# Patient Record
Sex: Male | Born: 1945 | Race: Black or African American | Hispanic: No | Marital: Married | Smoking: Former smoker
Health system: Southern US, Community
[De-identification: ages and names within clinical notes are randomized; demographics above are authoritative.]

## PROBLEM LIST (undated history)

## (undated) DIAGNOSIS — I82409 Acute embolism and thrombosis of unspecified deep veins of unspecified lower extremity: Secondary | ICD-10-CM

## (undated) DIAGNOSIS — I10 Essential (primary) hypertension: Secondary | ICD-10-CM

## (undated) DIAGNOSIS — Z950 Presence of cardiac pacemaker: Secondary | ICD-10-CM

## (undated) DIAGNOSIS — E039 Hypothyroidism, unspecified: Secondary | ICD-10-CM

## (undated) DIAGNOSIS — I639 Cerebral infarction, unspecified: Secondary | ICD-10-CM

## (undated) DIAGNOSIS — Z86718 Personal history of other venous thrombosis and embolism: Secondary | ICD-10-CM

## (undated) DIAGNOSIS — F4310 Post-traumatic stress disorder, unspecified: Secondary | ICD-10-CM

## (undated) DIAGNOSIS — K754 Autoimmune hepatitis: Secondary | ICD-10-CM

## (undated) DIAGNOSIS — A0472 Enterocolitis due to Clostridium difficile, not specified as recurrent: Secondary | ICD-10-CM

## (undated) DIAGNOSIS — K7460 Unspecified cirrhosis of liver: Secondary | ICD-10-CM

## (undated) DIAGNOSIS — E119 Type 2 diabetes mellitus without complications: Secondary | ICD-10-CM

## (undated) HISTORY — PX: TONSILLECTOMY: SUR1361

## (undated) HISTORY — DX: Enterocolitis due to Clostridium difficile, not specified as recurrent: A04.72

## (undated) HISTORY — PX: INSERT / REPLACE / REMOVE PACEMAKER: SUR710

## (undated) HISTORY — DX: Post-traumatic stress disorder, unspecified: F43.10

## (undated) HISTORY — DX: Autoimmune hepatitis: K75.4

## (undated) HISTORY — DX: Unspecified cirrhosis of liver: K74.60

---

## 1999-03-09 ENCOUNTER — Emergency Department (HOSPITAL_COMMUNITY): Admission: EM | Admit: 1999-03-09 | Discharge: 1999-03-09 | Payer: Self-pay | Admitting: Emergency Medicine

## 1999-03-13 ENCOUNTER — Encounter: Payer: Self-pay | Admitting: Emergency Medicine

## 1999-03-13 ENCOUNTER — Emergency Department (HOSPITAL_COMMUNITY): Admission: EM | Admit: 1999-03-13 | Discharge: 1999-03-13 | Payer: Self-pay | Admitting: Emergency Medicine

## 1999-11-06 ENCOUNTER — Encounter: Payer: Self-pay | Admitting: Emergency Medicine

## 1999-11-06 ENCOUNTER — Emergency Department (HOSPITAL_COMMUNITY): Admission: EM | Admit: 1999-11-06 | Discharge: 1999-11-06 | Payer: Self-pay | Admitting: Emergency Medicine

## 2002-04-28 ENCOUNTER — Encounter: Admission: RE | Admit: 2002-04-28 | Discharge: 2002-04-28 | Payer: Self-pay | Admitting: Urology

## 2002-04-28 ENCOUNTER — Encounter: Payer: Self-pay | Admitting: Urology

## 2002-10-26 ENCOUNTER — Emergency Department (HOSPITAL_COMMUNITY): Admission: EM | Admit: 2002-10-26 | Discharge: 2002-10-26 | Payer: Self-pay | Admitting: Emergency Medicine

## 2002-10-26 ENCOUNTER — Encounter: Payer: Self-pay | Admitting: Emergency Medicine

## 2003-07-25 ENCOUNTER — Ambulatory Visit (HOSPITAL_BASED_OUTPATIENT_CLINIC_OR_DEPARTMENT_OTHER): Admission: RE | Admit: 2003-07-25 | Discharge: 2003-07-25 | Payer: Self-pay | Admitting: Urology

## 2003-07-25 ENCOUNTER — Ambulatory Visit (HOSPITAL_COMMUNITY): Admission: RE | Admit: 2003-07-25 | Discharge: 2003-07-25 | Payer: Self-pay | Admitting: Urology

## 2007-02-18 ENCOUNTER — Inpatient Hospital Stay (HOSPITAL_COMMUNITY): Admission: EM | Admit: 2007-02-18 | Discharge: 2007-03-04 | Payer: Self-pay | Admitting: Emergency Medicine

## 2007-02-19 ENCOUNTER — Encounter: Payer: Self-pay | Admitting: Cardiology

## 2007-02-19 ENCOUNTER — Ambulatory Visit: Payer: Self-pay | Admitting: Cardiology

## 2007-02-20 ENCOUNTER — Encounter: Payer: Self-pay | Admitting: Internal Medicine

## 2007-02-23 ENCOUNTER — Ambulatory Visit: Payer: Self-pay | Admitting: Physical Medicine & Rehabilitation

## 2007-02-24 ENCOUNTER — Encounter: Payer: Self-pay | Admitting: Internal Medicine

## 2007-06-05 ENCOUNTER — Ambulatory Visit: Payer: Self-pay | Admitting: Family Medicine

## 2007-06-05 ENCOUNTER — Inpatient Hospital Stay (HOSPITAL_COMMUNITY): Admission: EM | Admit: 2007-06-05 | Discharge: 2007-06-11 | Payer: Self-pay | Admitting: Emergency Medicine

## 2007-06-08 ENCOUNTER — Ambulatory Visit: Payer: Self-pay | Admitting: Physical Medicine & Rehabilitation

## 2007-06-11 ENCOUNTER — Inpatient Hospital Stay (HOSPITAL_COMMUNITY)
Admission: RE | Admit: 2007-06-11 | Discharge: 2007-06-26 | Payer: Self-pay | Admitting: Physical Medicine & Rehabilitation

## 2007-07-29 ENCOUNTER — Encounter
Admission: RE | Admit: 2007-07-29 | Discharge: 2007-07-31 | Payer: Self-pay | Admitting: Physical Medicine & Rehabilitation

## 2007-07-29 ENCOUNTER — Ambulatory Visit: Payer: Self-pay | Admitting: Physical Medicine & Rehabilitation

## 2007-09-12 ENCOUNTER — Ambulatory Visit: Payer: Self-pay | Admitting: Infectious Disease

## 2007-09-12 ENCOUNTER — Inpatient Hospital Stay (HOSPITAL_COMMUNITY): Admission: EM | Admit: 2007-09-12 | Discharge: 2007-09-18 | Payer: Self-pay | Admitting: Emergency Medicine

## 2007-09-25 ENCOUNTER — Emergency Department (HOSPITAL_COMMUNITY): Admission: EM | Admit: 2007-09-25 | Discharge: 2007-09-25 | Payer: Self-pay | Admitting: Emergency Medicine

## 2007-12-02 ENCOUNTER — Emergency Department (HOSPITAL_COMMUNITY): Admission: EM | Admit: 2007-12-02 | Discharge: 2007-12-02 | Payer: Self-pay | Admitting: Emergency Medicine

## 2008-02-05 ENCOUNTER — Inpatient Hospital Stay (HOSPITAL_COMMUNITY): Admission: EM | Admit: 2008-02-05 | Discharge: 2008-02-10 | Payer: Self-pay | Admitting: Emergency Medicine

## 2008-02-05 ENCOUNTER — Ambulatory Visit: Payer: Self-pay | Admitting: Cardiology

## 2008-02-06 ENCOUNTER — Ambulatory Visit: Payer: Self-pay | Admitting: Infectious Diseases

## 2008-02-08 ENCOUNTER — Ambulatory Visit: Payer: Self-pay | Admitting: Vascular Surgery

## 2008-02-08 ENCOUNTER — Encounter (INDEPENDENT_AMBULATORY_CARE_PROVIDER_SITE_OTHER): Payer: Self-pay | Admitting: Internal Medicine

## 2008-02-08 ENCOUNTER — Encounter: Payer: Self-pay | Admitting: Infectious Diseases

## 2008-02-12 ENCOUNTER — Encounter (INDEPENDENT_AMBULATORY_CARE_PROVIDER_SITE_OTHER): Payer: Self-pay | Admitting: *Deleted

## 2008-02-12 ENCOUNTER — Telehealth (INDEPENDENT_AMBULATORY_CARE_PROVIDER_SITE_OTHER): Payer: Self-pay | Admitting: *Deleted

## 2008-02-16 ENCOUNTER — Encounter (INDEPENDENT_AMBULATORY_CARE_PROVIDER_SITE_OTHER): Payer: Self-pay | Admitting: *Deleted

## 2008-02-19 ENCOUNTER — Telehealth (INDEPENDENT_AMBULATORY_CARE_PROVIDER_SITE_OTHER): Payer: Self-pay | Admitting: *Deleted

## 2008-02-23 ENCOUNTER — Emergency Department (HOSPITAL_COMMUNITY): Admission: EM | Admit: 2008-02-23 | Discharge: 2008-02-23 | Payer: Self-pay | Admitting: Emergency Medicine

## 2008-02-23 ENCOUNTER — Telehealth (INDEPENDENT_AMBULATORY_CARE_PROVIDER_SITE_OTHER): Payer: Self-pay | Admitting: *Deleted

## 2008-03-07 ENCOUNTER — Encounter (INDEPENDENT_AMBULATORY_CARE_PROVIDER_SITE_OTHER): Payer: Self-pay | Admitting: *Deleted

## 2008-03-09 ENCOUNTER — Encounter (INDEPENDENT_AMBULATORY_CARE_PROVIDER_SITE_OTHER): Payer: Self-pay | Admitting: *Deleted

## 2008-08-28 ENCOUNTER — Inpatient Hospital Stay (HOSPITAL_COMMUNITY): Admission: EM | Admit: 2008-08-28 | Discharge: 2008-08-31 | Payer: Self-pay | Admitting: Emergency Medicine

## 2008-08-30 ENCOUNTER — Ambulatory Visit: Payer: Self-pay | Admitting: Surgery

## 2008-08-30 ENCOUNTER — Encounter (INDEPENDENT_AMBULATORY_CARE_PROVIDER_SITE_OTHER): Payer: Self-pay | Admitting: Internal Medicine

## 2008-10-17 ENCOUNTER — Emergency Department (HOSPITAL_COMMUNITY): Admission: EM | Admit: 2008-10-17 | Discharge: 2008-10-17 | Payer: Self-pay | Admitting: Emergency Medicine

## 2009-12-05 ENCOUNTER — Emergency Department (HOSPITAL_COMMUNITY): Admission: EM | Admit: 2009-12-05 | Discharge: 2009-12-05 | Payer: Self-pay | Admitting: Emergency Medicine

## 2009-12-24 ENCOUNTER — Inpatient Hospital Stay (HOSPITAL_COMMUNITY): Admission: EM | Admit: 2009-12-24 | Discharge: 2009-12-26 | Payer: Self-pay | Admitting: Emergency Medicine

## 2010-11-18 ENCOUNTER — Encounter: Payer: Self-pay | Admitting: Interventional Radiology

## 2010-11-18 ENCOUNTER — Encounter: Payer: Self-pay | Admitting: Internal Medicine

## 2011-01-16 LAB — GLUCOSE, CAPILLARY
Glucose-Capillary: 137 mg/dL — ABNORMAL HIGH (ref 70–99)
Glucose-Capillary: 178 mg/dL — ABNORMAL HIGH (ref 70–99)
Glucose-Capillary: 191 mg/dL — ABNORMAL HIGH (ref 70–99)
Glucose-Capillary: 210 mg/dL — ABNORMAL HIGH (ref 70–99)
Glucose-Capillary: 225 mg/dL — ABNORMAL HIGH (ref 70–99)
Glucose-Capillary: 72 mg/dL (ref 70–99)

## 2011-01-16 LAB — CBC
MCV: 85.5 fL (ref 78.0–100.0)
RBC: 4.11 MIL/uL — ABNORMAL LOW (ref 4.22–5.81)
WBC: 6.3 10*3/uL (ref 4.0–10.5)

## 2011-01-16 LAB — DIFFERENTIAL
Lymphs Abs: 2.9 10*3/uL (ref 0.7–4.0)
Monocytes Relative: 14 % — ABNORMAL HIGH (ref 3–12)
Neutro Abs: 2.1 10*3/uL (ref 1.7–7.7)
Neutrophils Relative %: 33 % — ABNORMAL LOW (ref 43–77)

## 2011-01-16 LAB — POCT CARDIAC MARKERS
CKMB, poc: 1.3 ng/mL (ref 1.0–8.0)
Myoglobin, poc: 111 ng/mL (ref 12–200)

## 2011-01-16 LAB — APTT: aPTT: 30 seconds (ref 24–37)

## 2011-01-16 LAB — BASIC METABOLIC PANEL
Calcium: 8.8 mg/dL (ref 8.4–10.5)
Chloride: 98 mEq/L (ref 96–112)
Creatinine, Ser: 1.42 mg/dL (ref 0.4–1.5)
GFR calc Af Amer: >60 mL/min (ref >60)

## 2011-01-16 LAB — VITAMIN B12: Vitamin B-12: 638 pg/mL (ref 211–911)

## 2011-01-16 LAB — URINALYSIS, ROUTINE W REFLEX MICROSCOPIC
Bilirubin Urine: NEGATIVE
Hgb urine dipstick: NEGATIVE
Protein, ur: 30 mg/dL — AB
Urobilinogen, UA: 0.2 mg/dL (ref 0.0–1.0)

## 2011-01-16 LAB — FOLATE: Folate: 12.1 ng/mL

## 2011-01-16 LAB — CARDIAC PANEL(CRET KIN+CKTOT+MB+TROPI)
CK, MB: 2.6 ng/mL (ref 0.3–4.0)
Total CK: 90 U/L (ref 7–232)
Troponin I: 0.07 ng/mL — ABNORMAL HIGH (ref 0.00–0.06)

## 2011-01-16 LAB — LIPID PANEL
Cholesterol: 134 mg/dL (ref 0–200)
LDL Cholesterol: 46 mg/dL (ref 0–99)
VLDL: 59 mg/dL — ABNORMAL HIGH (ref 0–40)

## 2011-01-16 LAB — RETICULOCYTES
Retic Count, Absolute: 56.7 10*3/uL (ref 19.0–186.0)
Retic Ct Pct: 1.3 % (ref 0.4–3.1)

## 2011-01-16 LAB — PROTIME-INR: INR: 1.03 (ref 0.00–1.49)

## 2011-01-16 LAB — TSH: TSH: 1.989 u[IU]/mL (ref 0.350–4.500)

## 2011-01-16 LAB — CK TOTAL AND CKMB (NOT AT ARMC)
Relative Index: INVALID (ref 0.0–2.5)
Total CK: 94 U/L (ref 7–232)

## 2011-01-17 LAB — URINALYSIS, ROUTINE W REFLEX MICROSCOPIC
Bilirubin Urine: NEGATIVE
Ketones, ur: NEGATIVE mg/dL
Nitrite: POSITIVE — AB
Protein, ur: >300 mg/dL — AB
Urobilinogen, UA: 1 mg/dL (ref 0.0–1.0)
pH: 7 (ref 5.0–8.0)

## 2011-01-17 LAB — URINE CULTURE: Colony Count: >=100000

## 2011-01-17 LAB — DIFFERENTIAL
Basophils Relative: 0 % (ref 0–1)
Eosinophils Absolute: 0 10*3/uL (ref 0.0–0.7)
Lymphs Abs: 1.8 10*3/uL (ref 0.7–4.0)
Neutrophils Relative %: 81 % — ABNORMAL HIGH (ref 43–77)

## 2011-01-17 LAB — POCT I-STAT, CHEM 8
HCT: 42 % (ref 39.0–52.0)
Hemoglobin: 14.3 g/dL (ref 13.0–17.0)
Potassium: 3.9 mEq/L (ref 3.5–5.1)
Sodium: 133 mEq/L — ABNORMAL LOW (ref 135–145)

## 2011-01-17 LAB — CBC
MCHC: 33.3 g/dL (ref 30.0–36.0)
MCV: 85.6 fL (ref 78.0–100.0)
Platelets: 153 10*3/uL (ref 150–400)
WBC: 18.4 10*3/uL — ABNORMAL HIGH (ref 4.0–10.5)

## 2011-01-17 LAB — URINE MICROSCOPIC-ADD ON

## 2011-01-21 LAB — GLUCOSE, CAPILLARY: Glucose-Capillary: 192 mg/dL — ABNORMAL HIGH (ref 70–99)

## 2011-03-12 NOTE — H&P (Signed)
NAMEBernita Li NO.:  [id number]   MEDICAL RECORD NO.:  [id number]          PATIENT TYPE:  EMS   LOCATION:  MAJO                         FACILITY:  [facility]   PHYSICIAN:  Eduard Clos, MDDATE OF BIRTH:  August 24, 1946   DATE OF ADMISSION:  08/28/2008  DATE OF DISCHARGE:                              HISTORY & PHYSICAL   PRIMARY CARE PHYSICIAN:  Dr. Sherril Croon.   CHIEF COMPLAINT:  Right lower extremity swelling, erythema and  discharge.   HISTORY OF PRESENTING ILLNESS:  [age]-year-old male with a history  of DVT on heparin subcutaneous, diabetes mellitus type 2, noncompliant  with medications, hypertension, hyperlipidemia, previous history of  stroke and facial trauma during service.  Presented to the emergency  room complaining of increasing swelling of his right lower extremity.  He states that a pimple-like swelling started on his right greater toe,  which slowly starting increasing in size, swelling and erythema and now  has a small wound there.  It is not clearly defined, has some discharge  and some blood in the discharge.  Denies any fever or chills.  Patient  had an x-ray, which shows possibility of 5th metatarsal fracture.  Patient has been admitted for further management of his cellulitis.  Patient denies any chest pain, shortness of breath, weakness of limbs,  loss of consciousness, nausea, vomiting, diarrhea, or dysuria or any  discharges.  The patient is incontinent of urine and wears a diaper.   PAST MEDICAL HISTORY:  1. Diabetes mellitus type 2.  Has been off medications for a week now.  2. Hypertension.  3. History of previous CVA.  4. Facial injury during service in [employer].  5. Hyperlipidemia.   PAST SURGICAL HISTORY:  1. Umbilical hernia surgery.  2. Left inguinal hernia surgery.  3. Facial surgery.  4. Has had a cardiac cath 2 or 3 years ago and patient states that it      was fine.   MEDICATON PRIOR TO ADMISSION:  1. Patient  remembers some of the medicines and said he was recently      changed to Lantus insulin 60 units every 12 with regular insulin 10      units before each meal 3 times daily.  2. He is also on heparin 250 units subcutaneous every 12 for his DVT      and does not know why he is not on Coumadin.  3. He takes a medication for blood pressure, which he states is      nifedipine, dose not know.  4. He takes Lipitor 20 mg p.o. daily.   ALLERGIES:  No known drug allergies.   FAMILY HISTORY:  Nothing contributory.   SOCIAL HISTORY:  He lives at home.  Not married.  Gets support form his  sister.  Has two kids.  Denies smoking cigarettes, drinking alcohol or  using illegal drugs.   REVIEW OF SYSTEMS:  As in history of presenting illness.  Nothing else  significant.   PHYSICAL EXAMINATION:  Patient examined at bedside.  Not in acute  distress.  VITAL  SIGNS:  Blood pressure 120/68, pulse 78, temperature 98.1,  respirations 18, O2 saturation 98%.  HEENT:  Anicteric.  No pallor.  CHEST:  B bilateral air entry present.  No rhonchi on auscultation.  HEART:  S1, S2 heard.  ABDOMEN:  Soft, nontender.  There is umbilical hernia, which is not  obstructed.  Bowel sounds heard.  CNS:  Alert, awake, or tined to time, place and person.  Moves all four  extremities.  EXTREMITIES:  There is swelling of the right foot extending up to his  ankle.  There is also ill-defined ulcer like area on his right greater  toe base with some bloody discharge.  There is no focal tenderness.  He  is able to move his toes without difficulty and his ankle also without  difficulty.  There is no tenderness in his 5th metatarsal bone area.  Peripheral pulses felt.  No edema appreciated in the left foot.   LABORATORIES:  X-ray of his right foot shows soft tissue swelling.  No  plain film evidence of osteomyelitis.  Lucency in the base of the 5th  metatarsal may represent overlapping shadows, but a nondisplaced  incomplete  fracture cannot be excluded.  Chest x-ray:  Cardiomegaly.  No  evidence of acute pulmonary abnormality.  CBC:  WBC is 6.3, hemoglobin  11.2, hematocrit 34.3.  Platelets 196.  Neutrophils 48%.  PTT/INR 13.1  and 1.  Complete metabolic panel:  Sodium 129, potassium 4.4, chloride  98, carbon dioxide 25, glucose 57, BUN 32, anion gap is 7.  AST 20, ALT  21.  Albumin 3.6, calcium 9.5.  Acetone is negative.  Troponin-I less  than 0.05.  BNP less than 30.  Urine shows blood negative.  Urine  glucose more than 1000.  Ketones negative.  WBC 0.  Nitrites negative.  Leukocytes negative.   ASSESSMENT:  1. Cellulitis of the right foot.  2. Possible fracture of the 5th metatarsal.  3. Diabetes mellitus type 2, uncontrolled.  4. Dehydration.  5. Hypertension.  6. History of deep vein thrombosis on heparin.  7. Hyperlipidemia.   PLAN:  Admit patient to medical floor.  We will start patient on  intravenous vancomycin and Zosyn.  Get blood cultures, wound cultures.  We will place patient on Lantus at his regular dose along with sliding  scale coverage.  Get an MRI of his foot.  Probably may need an  orthopedic consult based pm the MRI.      Eduard Clos, MD  Electronically Signed     ANK/MEDQ  D:  08/28/2008  T:  08/28/2008  Job:  [id number]

## 2011-03-12 NOTE — Discharge Summary (Signed)
NAMEOMER, PUCCINELLI NO.:  [id number]   MEDICAL RECORD NO.:  [id number]          PATIENT TYPE:  IPS   LOCATION:  4035                         FACILITY:  [facility]   PHYSICIAN:  Ellwood Dense, M.D.   DATE OF BIRTH:  [birth year]   DATE OF ADMISSION:  06/11/2007  DATE OF DISCHARGE:  06/26/2007                               DISCHARGE SUMMARY   DISCHARGE DIAGNOSES:  1. Right subinsular infarction.  2. Hypothyroidism.  3. Insulin-dependent diabetes mellitus.  4. Post-traumatic stress syndrome.  5. Hyperlipidemia.  6. Gastroesophageal flex disease.  7. E-coli urinary tract infection, resolved.  8. Hypertension.   This 65 year old male admitted August 8 with altered mental status,  slurred speech.  MRI showed a small right subinsular acute infarction.  MRA with atherosclerotic changes, left internal carotid are stenosis  70%.  Recent echocardiogram with ejection fraction of 60% and normal  left ventricular function.  E. coli urinary tract infection treated with  Bactrim maintained on aspirin for stroke.  Blood sugars elevated, Lantus  insulin adjusted for history of insulin-dependent diabetes mellitus.   PAST MEDICAL HISTORY:  See discharge diagnoses.  No alcohol.  Remote  smoker.   ALLERGIES:  None.   SOCIAL HISTORY:  He lives with his brother. Chart notes that a son and  daughter-in-law would like to take him home if at all possible at  discharge.   MEDICATIONS PRIOR TO ADMISSION:  1. Regular insulin 70 units.  2. Aspirin 81 mg daily.  3. Zocor daily.  4. Ezetimibe 10 mg daily.  5. Neurontin 300 mg daily.  6. Synthroid 125 mcg daily.  7. Lorazepam 0.5 mg daily.  8. Potassium 20 mEq daily.  9. Zoloft 100 mg daily.  10.Trazodone 200 mg at bedtime.  11.Insulin-N 30 units 3 times daily.  12.Lisinopril 40 mg daily.   It was noted the patient with a poor medical compliance to his  medications.   REHABILITATION AND HOSPITAL COURSE:  The patient was admitted  to  inpatient rehab services with therapies initiated on a 3-hour daily  basis consisting of physical therapy, occupational therapy, speech  therapy and rehabilitation and nursing. The following issues were  addressed during the patient's rehabilitation stay.  Pertaining to Mr.  [name] right subinsular infarction remained stable.  Maintained on  aspirin therapy.  Functionally he showed slow progressive gains, minimal  assist for transfers, minimal assist for stairs, ambulating 150 feet  minimal assistance, minimal assistance for activities of daily living.  He was on a diabetic diet for his diabetes mellitus, latest blood sugars  of 90 and 76.  He continued on Lantus insulin. Again it was stressed the  need to maintain current medical regimen as he had been noncompliant  with medications in the past.  Blood pressures were monitored and  controlled on lisinopril 20 mg daily, diastolic pressures 60-83.  He had  completed a course of antibiotics for a urinary tract infection.  He  would remain on Zocor and Zetia for hyperlipidemia.  Overall his  strength and endurance had greatly improved.  He was encouraged with his  overall progress and discharged to home.   Latest labs showed a hemoglobin 11.5, hematocrit 34, platelet 256,000.  Sodium 134, potassium 5.0, BUN 20, creatinine 1.55.  A follow-up cranial  CT scan was done on August 20 that showed no new changes.  He was  discharged to home.   DISCHARGE MEDICATIONS:  1. Zocor 10 mg at bedtime.  2. Neurontin 300 mg daily.  3. Protonix 40 mg daily.  4. Zetia 10 mg daily.  5. Zoloft 100 mg daily.  6. Trazodone 200 mg at bedtime.  7. Synthroid 125 mcg daily.  8. Aspirin 81 mg daily.  9. Ativan 0.5 mg every a.m.  10.Lantus insulin 50 units every 12 hours.  11.Lisinopril 20 mg daily.  12.Insulin NovoLog 8 units 3 times daily.  13.Amoxicillin 250 mg 3 times daily until June 29, 2007 for      Enterococcus urinary tract infection.    DIET:  Diabetic diet.   SPECIAL INSTRUCTIONS:  The patient would follow up with Dr. Ellwood Dense at the outpatient rehab service office. His primary care  providers were the [facility] [place]  received medical management for.      Mariam Dollar, P.A.    ______________________________  Ellwood Dense, M.D.    DA/MEDQ  D:  06/25/2007  T:  06/26/2007  Job:  [id number]   cc:   Lindaann Slough, M.D.  [facility]

## 2011-03-12 NOTE — Assessment & Plan Note (Signed)
[name] returns to the clinic today for followup evaluation.  He is a  65 year old African-American male who was admitted June 05, 2007 with  altered mental status and slurred speech.  MRI study of the brain shows  small right subinsular acute infarction.  MRA study shows  atherosclerotic changes, along with stenosis of 70% of the left internal  carotid artery.  Recent echocardiogram showed an ejection fraction of  60% with normal left ventricular function.  He was treated for E. coli  urinary tract infection.  He subsequently was moved to the  rehabilitation unit June 11, 2007 and remained there through discharge  June 26, 2007.   Since discharge, the patient continues to receive home health physical  and occupational therapy.  He has been back to see his primary care  physician at [facility].  He reports that no changes in his  medicines were made.  He reports that his blood sugar has been in the 60  to 220 range.  He reports that he is continent of bowel and bladder.  He  continues to live with his brother and tries to eat low fat diet.  He  reports only mild pain of his bilateral hands and feet, which is a  chronic problem for him.   REVIEW OF SYSTEMS:  Noncontributory.   MEDICATIONS:  1. Zocor 10 mg nightly.  2. Neurontin 300 mg daily.  3. Protonix 40 mg daily.  4. Zetia 10 mg daily.  5. Zoloft 100 mg daily.  6. Trazodone 200 mg nightly.  7. Synthroid 125 mcg daily.  8. Aspirin 81 mg daily.  9. Ativan 0.5 mg q. a.m.  10.Lantus insulin 50 units q.12h.  11.Lisinopril 20 mg daily.  12.NovoLog insulin 8 units t.i.d. with meals.   PHYSICAL EXAM:  Reasonably well-appearing elderly adult male seated in a  regular chair.  He ambulates with a rolling walker.  His blood pressure is 134/67, pulse 62, respiratory rate is 18, and O2  saturation is 98% on room air.  He has 4-/5 strength in the bilateral upper extremities and 3+/5 to 4-/5  strength in the bilateral lower  extremities.  Bulk and tone are normal.  Sensation was intact to light touch throughout the bilateral upper and  lower extremities.   IMPRESSION:  1. Status post small right subinsular infarction with minimal left-      sided weakness.  2. Insulin dependent diabetes mellitus.  3. Post-traumatic stress disorder.  4. Hypothyroidism.  5. Hypertension.  6. Gastroesophageal reflux disease.  7. Dyslipidemia.   In the office today, no refill on medications is necessary.  We will  have him complete his home health therapy and then have him follow up  with his primary care physicians at [facility].  We will plan to  see him in followup on an as needed basis.  He is doing very well from  his recent stroke.  Continues on aspirin therapy.  He  needs slightly better management of his diabetes, but that will be  supplied through the [facility].  We will plan on seeing him in  followup on an as needed basis.           ______________________________  Ellwood Dense, M.D.     DC/MedQ  D:  07/31/2007 12:21:10  T:  07/31/2007 18:44:35  Job #:  [id number]

## 2011-03-12 NOTE — Discharge Summary (Signed)
NAMEHARLIE, [name]                 ACCOUNT NO.:  [id number]   MEDICAL RECORD NO.:  [id number]          PATIENT TYPE:  INP   LOCATION:  4714                         FACILITY:  [facility]   PHYSICIAN:  Mariea Stable, MD   DATE OF BIRTH:  [birth year]   DATE OF ADMISSION:  09/12/2007  DATE OF DISCHARGE:  09/18/2007                               DISCHARGE SUMMARY   DISCHARGE DIAGNOSES:  1. Syncopal episode.  2. Altered mental status.  3. Diabetes mellitus.  4. Hypertension  5. Hyperlipidemia.  6. Hypothyroidism.  7. History of cerebrovascular disease status post cerebrovascular      accident.  8. Pulmonary artery disease.  9. Post traumatic stress disorder.  10.Chronic sinusitis.  11.Bradycardia.   DISCHARGE MEDICATIONS:  1. Aspirin 81 mg p.o. daily.  2. Synthroid 125 mcg p.o. daily.  3. Zocor 10 mg p.o. daily.  4. Zetia 10 mg p.o. daily.  5. Lisinopril 20 mg p.o. daily.  6. Zoloft 100 mg p.o. daily.  7. Claritin 10 mEq p.o. daily.  8. Ativan 0.5 mg p.o. daily p.r.n.anxiety.  9. Lantus 60 units subcutaneously nightly.  10.Aggrenox 25/200 mg one tablet p.o. b.i.d.  11.HCTZ 25 mg p.o. daily.   FOLLOWUP:  The patient was instructed to followup with [facility] as soon  as possible.  Preferably within the next 1 to 2 weeks.  The patient also  had an advanced set up to go draw the insulin syringes for the patient's  sister to administer.  During followup at the [place], the patient's insulin  could be further adjusted along the antihypertensive.  Medications for  hyperlipidemia, hypothyroidism discussed.  The patient apparently has a  very poor compliance and he is not competent to keep track of his own  medications.  Given that the patient is competent; however, to decide on  SNF placement, the patient was discharged home with home health set up  and his sister to help administrate the insulin.   PROCEDURES:  1. Chest x-ray September 12, 2007, Impression:  mild CE and no acute  pulmonary process.  2. Head CT without contrast 09/12/07, Impression:  1. negative for      acute intracranial hemorrhage or edema.  2. sinus inflammatory      changes.   CONSULTATIONS:  Psychiatry was consulted and Dr. Jeanie Sewer saw the  patient.   BRIEF ADMITTING HISTORY AND PHYSICAL:  [name] is a 65 year old African  American male with past medical history of a subinsular CVA with minimal  residual left-sided weakness, diabetes mellitus type 2 treated with  insulin, hypertension, hyperlipidemia, and hypothyroidism who now  presents after being found by family to have altered mental status.  The  patient was found on the floor and EMS was called.  CBG was found to be  in the 30s per EMS.  The patient was given 1 amp of D5 and subsequently  in the emergency department found the glucose in the 30s  that was read  at 63.  The patient was started on D5 half normal saline in route with  CBG of 90s at that  time.  Per the family and patient is currently at his  baseline.  We will increase fluids.   PAST MEDICAL HISTORY:  1. The patient is status post a small right subinsular CVA with      minimal residual deficits  2. Diabetes mellitus type 2 with a hemoglobin A1c of 16 in April 2008.  3. Post traumatic stress disorder.  4. Hypertension.  5. __________  6. History of acute renal failure that resolved.  7. History of hypothyroidism with a TSH of 30 in April 2008  8. History of GERD.  9. History of dyslipidemia.  10.History of enterococcal UTI.  11.The patient is status post hernia repair.  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  13.History of circumcision 2004 for chronic balanitis.   LABORATORY DATA:  On admission, sodium 139, potassium 4, chloride 100,  bicarb 35, BUN 27, creatinine 0.5, glucose 60.  Hemoglobin 15,  hematocrit 45.  CK-MB 2, troponin less than 0.05, myoglobin greater than  500.   PHYSICAL EXAMINATION:  VITAL SIGNS:  Temperature 97, blood pressure  132/79,  pulse 40, respirations 18, oxygen saturation 95% on 3 liters.  GENERAL:  The patient was somnolent, though easily arousable, though  falling right back to sleep quickly.  EYES:  Small, but not equally reactive to light.  Extraocular movements  are grossly intact.  The patient with muddy brown sclerae, but  anicteric.  ENT:  Pink, moist mucous membranes.  Oropharynx was clear.  NECK:  Supple.  Trachea midline.  No lymphadenopathy.  No bruits.  RESPIRATIONS:  Clear to auscultation bilaterally.  CARDIOVASCULAR:  Decreased S1, positive S2.  Systolic ejection murmur.  A grade 2/6 mesentery at left sternal border.  ABDOMEN:  Bowel sounds present, distended but nontender.  No rebound or  guarding.  EXTREMITIES:  Positive edema  SKIN:  No rashes or lesions.  No lymphadenopathy.  PSYCHIATRIC:  The patient was clinically depressed before this  admission.  NEURO:  Cranial nerves II-XII are grossly intact.  Strength was 5/5  bilateral upper extremities, 5/5 right lower extremity, and 4/5 left  lower extremity.  Sensation was decreased in lower extremity and was in  greater in the left .   HOSPITAL COURSE:  1. Syncope.  Given the patient's history, a CBG found to be 30s most      likely secondary to excess insulin with hypoglycemia.  The      patient's niece reported that she usually draws and checks the      needles for the patient after asking which ones will be given and      how much.  Niece states that the patient stated he took 55 units of      regular insulin.  The patient did agree to having said that.      Therefore, patient was given 55 units of regular insulin before      along with 15 units of Lantus the night prior to admission.      Therefore, admission hypoglycemic agents held in the beginning.      The patient was started on sliding scale, converted to Lantus with      daily increases.  The patient was discharged eventually with 60      units nightly and glucose level slightly  elevated.  Of note, the      patient was initially admitted and kept on the D5W half normal      saline to ensure that patient did not become hypoglycemic.  This      was then changed to saline IV fluids.  2. Hypertension.  Initially medications were held at the time.      Lisinopril was started toward the end of the hospitalization.  A      couple of medications were returned that the patient was supposed      to be on given that HCTZ was one of them that was added prior to      discharge.  3. Hyperlipidemia.  The patient was continued on his home Zetia and      Zocor.  Though this needs to be addressed at the [facility]  and may      need to be increased.  4. Hypothyroidism.  As stated in the past medical history, the      patient's TSH was approximately 32 and going down to 8 in August,      but present admission TSH was increased by 22.  This again shows      noncompliance.  The patient was restarted at home dose and to      followup by the [place].  5. History of CAD, cerebrovascular disease status post cerebrovascular      accident.  The patient was noted to have a left internal carotid      artery stenosis at approximately 7%, which is about a 70% lesion.      The patient was recommended to have stent placement per      [facility] some time ago.  The patient and sister      report the [place] is aware, but did not want to undergo any repair at      the time.  The patient again needs to followup.  The patient will      continue on aspirin throughout this hospitalization and Aggrenox      was then started empirically.  It was then found when sister      brought some of the medication the patient was taking, that he was      supposed to be on Aggrenox as well.  6. Post traumatic stress disorder.  The patient was taking Zoloft and      Ativan p.r.n. agitation.  The patient's trazodone was held      secondary to sedation.  The patient denied any suicidal ideation      and depression  throughout the hospitalization.  7. Chronic sinusitis.  The patient was continued on his Claritin      throughout the hospital stay.  8. Bradycardia.  The patient was stable with heart rates of 40s to      50s.  The patient alternating between first degree AV block and      second degree Mobitz type 1 Wenckebach.  It is questionable if this      is all related to his significant hypothyroidism.  Again, neither      of the two wanted any measures.  This patient was asymptomatic      throughout but this needs to be addressed on outpatient followup at      [facility].  9. Medical adherence and social support issues.  The patient was      offered SNF placement, but refused.  Psychiatry was consulted.  Dr.      Jeanie Sewer thinks the patient is competent to make the decision,      although incompetent to manage his medications.  It was stated that  as long as the patient has home health or the sister could      administer the insulin along with the other medications, the      patient could be sent home.  [facility] was arranged for      the patient and sister was instructed on how to administer the      patient's medications on discharge.   PLAN:  Place and followup at the [place] was to be made as soon as possible.   DISCHARGE LABORATORY DATA:  WBC 7.1, hemoglobin 12.4, platelets 176.  Sodium 135, potassium 4.0, chloride 95, bicarb 30, glucose 115, BUN 18,  creatinine 1.36, calcium 9.5.      Mariea Stable, MD  Electronically Signed     MA/MEDQ  D:  09/21/2007  T:  09/22/2007  Job:  [id number]   cc:   Antonietta Breach, M.D.

## 2011-03-12 NOTE — H&P (Signed)
[name], [name] NO.:  [id number]   MEDICAL RECORD NO.:  [id number]          PATIENT TYPE:  IPS   LOCATION:  4035                         FACILITY:  [facility]   PHYSICIAN:  Ellwood Dense, M.D.   DATE OF BIRTH:  1946/07/17   DATE OF ADMISSION:  06/11/2007  DATE OF DISCHARGE:                              HISTORY & PHYSICAL   PRIMARY CARE PHYSICIAN:  [facility]   CARDIOLOGIST:  [facility].   GU:  Dr. Brunilda Payor.   HISTORY OF PRESENT ILLNESS:  [name] is a 65 year old African-American  male with a history of insulin-dependent diabetes mellitus along with  hypertension and posttraumatic stress disorder.   The patient was admitted June 05, 2007, with altered mental status and  slurred speech.  An MRI study of the brain showed small right subinsular  acute infarct.  An MRA study showed atherosclerotic changes with  stenosis of the left internal carotid artery at 70%.   The patient had a recent echocardiogram which showed an ejection  fraction of 60% with normal left ventricular function.  E. coli urinary  tract infection was identified and he was placed on Bactrim through  June 19, 2007, then told to stop.  He was maintained on aspirin for  stroke prophylaxis.  Blood sugars have been elevated at 218, 262, 275  and Lantus insulin was recently adjusted.  The TSH was elevated at 8.114  and the patient was noted to be noncompliant with thyroid medications.  Since that time the thyroid supplement has been restarted.  Followup TSH  is planned for 1 month's time.   There was question regarding changing his aspirin to Aggrenox for stroke  prophylaxis.  The eventual decision was made to stay with aspirin  secondary to recent positive occult blood in his stool.   The patient was evaluated by the rehabilitation physicians and felt to  be an appropriate candidate for inpatient rehabilitation.   REVIEW OF SYSTEMS:  Positive for lumbago,  depression and cough.   PAST MEDICAL HISTORY:  1. Insulin-dependent diabetes mellitus.  2. Hypertension.  3. Questionable TIA vs. stroke in the past.  4. Gastroesophageal reflux disease.  5. Hypothyroidism, non-compliant on supplementation daily.  6. Dyslipidemia.  7. Posttraumatic stress disorder.  8. Prior hernia repair.  9. 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   FAMILY HISTORY:  Positive for cancer.   SOCIAL HISTORY:  The patient lives with his brother who has some health  problems of his own.  They both did some minimal cooking but ate out  frequently.  He does have a remote history of tobacco usage and denies  alcohol intake.  There reportedly are a son and daughter-in-law who  would like to take him home at discharge.   FUNCTIONAL HISTORY PRIOR TO ADMISSION:  Independent using a cane and  rolling walker prior to admission.   ALLERGIES:  No known drug allergies.   MEDICATIONS PRIOR TO ADMISSION:  1. Regular insulin 70 units daily.  2. NPH insulin 30 units t.i.d.  3. Lisinopril 40 mg daily.  4. Trazodone 200 mg q.h.s.  5. Zoloft 100 mg daily.  6. Potassium chloride 120 mEq daily.  7. Lorazepam 0.5 mg daily.  8. Synthroid 125 mcg p.o. daily.  9. Neurontin 300 mg p.o. daily.  10.Zocor daily.  11.Ezetimibe 10 mg daily.  12.Aspirin 81 mg daily.   LABORATORY:  Recent hemoglobin was 11.8 with a hematocrit of 35,  platelet count of 220,000 and white count of 9.5.  Recent CBGs have been  218, 262 and 275.  Recent sodium was 131, potassium 4.3, chloride 91,  CO2 34 and BUN 18 with creatinine of 1.3.   HEENT:  Normocephalic, nontraumatic, well appearing large adult male  lying in bed in no acute discomfort.  Blood pressure 124/70 with a pulse  of 67, respiratory rate 18, and temperature 98.5.  CARDIOVASCULAR:  Irregular rate and rhythm, S1 S2 without murmurs.  ABDOMEN:  Soft, nontender with positive bowel sounds.  LUNGS:  Clear to auscultation bilaterally.   NEUROLOGIC:  Alert and oriented times 2 to 3 with occasional Qs.  Bilateral upper extremity exam showed 4 to 4+/5 strength throughout.  Bulk and tone were normal and reflexes were 2+ and symmetrical.  LOWER EXTREMITY EXAM:  Showed hip flexion, knee extension and ankle  dorsiflexion at 4+/5 on the right and 4-/5 on the left.  Sensation was  intact to light touch throughout the bilateral lower extremities.  The  patient did have some delay in following 1 and 2 step commands with  occasional cues.   IMPRESSION:  1. Status post right subinsular infarct with left-sided weakness.  2. Insulin-dependent diabetes mellitus.  3. Poor medical compliance with medication prior to admission.   Presently, the patient has deficits in ADLs, transfers and ambulation  related to the above-noted right subinsular infarct.   PLAN:  1. Admit to the rehabilitation unit for daily therapies to include      physical therapy for range of motion, strengthening, bed mobility,      transfers, pre-gait training, gait training and equipment eval.  2. Occupational therapy for range of motion, strengthening, ADLs,      cognitive/perceptual training, splinting and equipment eval.  3. Rehab nursing for skin care, wound care and bowel and bladder      training as necessary.  4. Case Management to assess home environment, assist with discharge      planning and arrange for appropriate followup care.  5. Social worker to assess family and social support, assist in      discharge planning.  6. Speech Therapy for oral motor exercises and higher level cognitive      eval.  7. Continue high carbohydrate-modified diet.  8. CBGs a.c. and h.s.  9. NovoLog sliding scale insulin, sensitive.  10.Zetia 10 mg p.o. daily.  11.Neurontin 300 mg p.o. daily.  12.Synthroid 125 mcg p.o. daily.  13.Zoloft 100 mg p.o. daily.  14.Desyrel 200 mg p.o. q.h.s.  15.Protonix 40 mg p.o. daily.  16.Zocor 10 mg p.o. q.h.s.  17.Aspirin 81 mg p.o.  daily.  18.MiraLax 17 g with 8 ounces of water daily.  19.Ativan 0.5 mg p.o. daily.  20.NovoLog insulin 6 units subcu t.i.d. with meals.  21.Bactrim Double Strength, 1 tablet p.o. b.i.d. through June 19, 2007, then stop, for urinary tract infection.  22.Lantus insulin 50 units subcu q.12h.  23.DC IV fluids, if not already done.  24.Old EKG to chart.  25.Dulcolax suppository 1 per rectum daily p.r.n.  26.Routine turning to prevent skin  breakdown.   PROGNOSIS:  Fair/good.   ESTIMATED LENGTH OF STAY:  7-15 days.   GOALS:  Modified independent to standby assist ADLs, transfers and  ambulation.           ______________________________  Ellwood Dense, M.D.     DC/MEDQ  D:  06/11/2007  T:  06/11/2007  Job:  [id number]

## 2011-03-12 NOTE — Discharge Summary (Signed)
[name], DUDDY NO.:  [id number]   MEDICAL RECORD NO.:  [id number]          PATIENT TYPE:  INP   LOCATION:  3733                         FACILITY:  [facility]   PHYSICIAN:  Fransisco Hertz, M.D.  DATE OF BIRTH:  1946-07-04   DATE OF ADMISSION:  02/05/2008  DATE OF DISCHARGE:  02/10/2008                               DISCHARGE SUMMARY   DATE OF ADMISSION:  February 05, 2008.   DATE OF DISCHARGE:  February 10, 2008.   PRIMARY CARE PHYSICIAN:  [facility].   DISCHARGE DIAGNOSES:  1. Syncopal episode, likely multifactorial related to orthostatic      hypotension in the setting of excess Cialis use and hypoglycemia.  2. Newly diagnosed deep vein thrombosis, nonocclusive, with tiny      associated left upper lobe pulmonary emboli.  3. History of syncopal episode in 2001 secondary to hypoglycemia.  4. History of cerebrovascular accident, with right subinsular infarct      with minimal residual deficits.  5. Hypertension.  6. Type 2 diabetes mellitus, poorly controlled, hemoglobin A1c 14%.  7. Hyperlipidemia.  8. Hypothyroidism, poorly controlled.  9. History of coronary artery disease, status post 2 stents per      medical record, ejection fraction 40%.  10.History of bradycardia.  11.Newly diagnosed second-degree Mobitz type I (Wenckebach)      atrioventricular block.  12.History of left internal carotid artery stenosis of approximately      70%, stent recommended but the patient refused in November 2008.  13.History of posttraumatic stress disorder.  14.History of enterococcal urinary tract infection.  15.History of gastroesophageal reflux disease.  16.History of hernia repair.  17.History of the sphenoid sinusitis.  18.History of acute kidney injury in the setting of dehydration, April      2008.  19.Chronic tremor.   DISCHARGE MEDICATIONS:  1. Unfractionated heparin 20,000 units injected subcutaneously every      12 hours for 14  days.  2. Synthroid 75 mcg by mouth daily.  3. Lisinopril 40 mg by mouth daily.  4. Lantus 50 units injected subcutaneously every night at bedtime.  5. Lipitor 40 mg by mouth daily.  6. Aggrenox 1 capsule by mouth twice daily.  7. Hydrochlorothiazide 25 mg by mouth daily.  8. Claritin 10 mg by mouth daily.  9. Sertraline at home dose.  10.Cialis:  Take 1 tablet only when preparing for sexual activity.  Do      not take daily.   NOTE:  The patient was instructed to stop taking aspirin, as this  medication is already contained in Aggrenox.  He is also to avoid all AV  nodal blockers secondary to his diagnosis of Wenckebach AV block.   CONSULTATIONS:  Cardiology.   PROCEDURES:  1. Plain film of the chest on February 05, 2008, revealed low-volume film      with cardiomegaly and interstitial pulmonary edema.  2. Computed tomography scan of the head without contrast on February 05, 2008, revealed no acute intracranial abnormality.  Stable age      advanced  atrophy was present with mild subcortical white matter      disease.  3. Computed tomography angiogram of the chest on February 06, 2008,      revealed a moderately limited exam with tiny embolic burden to the      left upper lobe.  A dilated fluid-filled esophagus was present,      consistent with dysmotility.  Cardiomegaly, left ventricular      hypertrophy, and pericardial with small pleural effusions were      present.  Coronary artery atherosclerosis was noted.  4. Repeat plain film of the chest on February 06, 2008, demonstrated      diminished aeration but some improvement in the degree of pulmonary      vascular congestion.  5. Myocardial perfusion imaging study on February 08, 2008, revealed      decreased count in the anterior wall, mild in nature, possibly      related to attenuation.  Echocardiogram to look for anterior wall      motion abnormality was recommended.  6. Transthoracic echocardiogram performed on February 08, 2008,  revealed      overall normal left ventricular systolic function with an ejection      fraction of 60%.  There were no left ventricular regional wall      motion abnormalities.  Left ventricular wall thickness was      moderately to markedly increased.  There was a trivial pericardial      effusion anterior to the heart.   ADMISSION HISTORY:  [name] is a 65 year old African-American man with  an extensive past medical history significant for hypertension, type 2  diabetes mellitus, hypothyroidism, history of stroke, and history of a  syncopal episode caused by hypoglycemia in November 2008 who presented  to the [facility] Emergency Department on February 05, 2008, after  reportedly losing consciousness the morning of admission.  The patient  reports that he woke up at approximately 9:00 a.m., took Cialis, and  then had his caretaker give him insulin.  The patient was then noted to  be sitting on the couch, when he subsequently slumped over to the left  side and was apparently unconscious.  The patient reports that during  this time he could hear his family, but felt weak and dizzy.  The family  reports that the patient was verbally unresponsive.  EMS was called with  the blood pressure found to be 90/50, increased to 98/50 after 400 mL  bolus of IV fluid.  The patient reported that his glucose, checked by  his caregiver, was 60 before EMS arrival.  On recheck after EMS arrival,  CBG was 90.  In the emergency department, the patient was awake and  oriented.  He did report some change in his speech, saying that it was a  little off, but that this resolved.  He denied any associated  headache, nausea, vomiting, diarrhea, fevers, or chills.   ADMISSION PHYSICAL EXAM:  VITAL SIGNS:  Temperature 97.5 degrees  Fahrenheit, blood pressure 102/63, subsequently increased to 144/88,  pulse 68, respiration rate 14, and oxygen saturation 96% on 2 L nasal  cannula.  Orthostatic vital signs were checked  and were:  Supine blood  pressure 102/58 with a heart rate of 74, sitting blood pressure 148/86  with a heart rate of 74, and standing blood pressure 109/67 with a heart  rate of 78.  GENERAL:  No acute distress.  HEENT:  Pupils equal bilaterally, but sluggishly reactive to light.  Ruddy sclerae.  Moderate myosis.  Oropharynx grossly clear and moist.  Normocephalic and atraumatic.  NECK:  No lymphadenopathy, but moderate thyromegaly.  No JVD.  RESPIRATORY:  Clear to auscultation bilaterally except for mild crackles  in the left lower lung.  CARDIOVASCULAR:  Regular rate and rhythm without murmurs, rubs, or  gallops.  GASTROINTESTINAL:  Soft, tender to palpation at the site of a moderate  ventral hernia.  No rebound.  Positive bowel sounds.  EXTREMITIES:  No edema.  GENITOURINARY:  No CVA tenderness.  NEUROLOGIC:  Cranial nerves II through XII intact except for subjective  sensation difference on the right face versus the left face.  Hypoacusis  on the left was noted.  MUSCULOSKELETAL:  Exam revealed strength 4/5 in the left arm, 5/5 in all  other extremities.  Normal plantar reflexes.  Subjective decrease in  sensation in the bilateral feet.  Deep tendon reflexes difficult to  elicit.  Cerebellar function intact on the right, but mild dysmetria was  present on the left.   ADMISSION LABS:  Laboratory studies on the day of admission revealed:  Sodium 132, potassium 3.2, chloride 97, bicarbonate 28, BUN 15,  creatinine 1.41, glucose 251, and calcium 8.5.  Bilirubin 0.4, alkaline  phosphatase 97, AST 27, ALT 23, total protein 5.4, and albumin 2.8.  White blood cell count 6.2, hemoglobin 11.3, hematocrit 33.9, platelet  count 165,000, with an ANC of 44%.  Note that creatinine was 1.72 in  February 2009 and 1.36 in November 2008.  D-dimer was elevated at 3.71  with a Wells criteria score of 3, suggesting intermediate probability of  pulmonary embolus.  A transthoracic echo was done and is  described  above.  Urinalysis was negative except for 250 glucose and 100 protein.  Point of care cardiac enzymes revealed troponin 0.06, creatinine kinase  82, and CK-MB 2.5.  Coagulation studies were within normal limits.  Computed tomography scan of the head is as described above.   HOSPITAL COURSE:  1. Syncope.  The patient presented with a primary complaint of a      syncopal episode lasting approximately 30-45 minutes.  On detailed      questioning, the patient reports that he was not unconscious, but      instead severely weak and dizzy.  Differential diagnosis was      thought to include hypoglycemia, as this event occurred immediately      after an insulin injection, hypotension, especially after taking      daily Cialis for the 5 days prior to admission, and possibly      hypovolemia secondary to some subjective poor p.o. intake.  Other      diagnostic considerations include acute coronary syndrome versus      acute MI, pulmonary embolus, and stroke.  Studies in support of      hypotension causing this episode of syncope include marked      orthostasis on admission, with blood pressures as described above.      This resolved with holding Cialis and giving IV fluids.  Acute      myocardial infarction was ruled out with serial cardiac enzymes.      Although the patient's troponin was somewhat elevated at 0.07, upon      recheck this was not felt to be related to an acute myocardial      ischemic event.  EKG revealed bradycardia with second-degree Mobitz      type 1 (Wenckebach) AV block.  No acute ST-segment  depressions or      elevations were present to suggest new myocardial ischemia or      infarct.  Transthoracic echocardiogram was repeated, without signs      of wall motion abnormalities.  Myoview was performed, without clear      evidence of reversible ischemia.  Cardiology was consulted and thus      felt that the patient's syncope was not to do a cardiac related       issue.  Although he does have evidence of tiny pulmonary emboli in      the left lung as described above, this was not felt to explain his      syncope adequately.  Thus, we feel that it is likely secondary to a      combination of hypotension in the setting of Cialis overuse in      addition to transient hypoglycemia after insulin injection.  To      rectify these issues, the patient has been instructed to only take      Cialis when sexual activity is planned.  He is not to take this      medication daily.  He has also been encouraged to monitor his blood      glucose levels more carefully.  For now, we will discharge the      patient on a single dose of Lasix at that time, and allow a sliding      scale to be reinstituted by his primary care physician if so      desired.  Please also note that an acute stroke was considered, but      lack of changes by computed tomography scan on admission and lack      of clear neurologic deficits with a more likely explanation      detailed above, it was felt that stroke was not the most likely      diagnosis.  2. Chronic renal insufficiency.  The patient has a history of      transiently elevated creatinine, with creatinine of 1.36 in      November 2008, 1.72 in February 2009, and 1.41 on admission.  He      does have glucosuria as well as proteinuria, likely sequelae of his      poorly controlled diabetes mellitus and hypertension.  As of the      day of discharge, the patient's creatinine has decreased to 1.12,      but we recommend close followup as an outpatient given elevated      creatinines in the past.  3. Newly diagnosed second-degree Mobitz type 1 (Wenckebach) AV nodal      block.  The patient was monitored on telemetry during this      admission secondary to his presentation for syncope.  This revealed      change in episodes of bradycardia with near constant second-degree      Mobitz type 1 AV block.  Cardiology followed this patient  during      this admission, and recommended only monitoring.  The patient was      briefly started on metoprolol, but this was discontinued when the      second-degree Mobitz type 1 AV block was discovered.  He should      avoid AV nodal blockers in the future.  4. Hypothyroidism.  Very poorly controlled.  TSH checked on this      admission was severely elevated at 123.4.  The patient reports  that      he has been taking his Synthroid as recommended, but noncompliance      is suspected.  The patient was continued on Synthroid during this      admission.  Recommend close followup as an outpatient for return of      his TSH and free T2 within the normal range.  5. Type 2 diabetes mellitus, poorly controlled.  Hemoglobin A1c during      this admission was 14.0%.  Again, the patient reports compliance      with his insulin regimen, as he has a caretaker who assists him      with this 3 days a week.  Given that his A1c demonstrates such poor      control, and he presented with an episode of syncope and possible      hypoglycemia, we have elected to increase his dose of Lantus and      discontinue his sliding scale insulin at this time.  He will almost      certainly require increase in Lantus and reinstitution of sliding      scale insulin in the future, so we will defer this to his primary      care physician.  For now, his blood glucose levels have been      reasonably well controlled on Lantus with most recent values being      201, 279, 329, and 330.  6. Hyperlipidemia.  The patient has been continued on nystatin during      this admission, and will be discharged on Lipitor 40 mg by mouth      daily at bedtime.  7. History of cerebrovascular accident.  The patient was continued on      Aggrenox during this admission.  Of note, his home medication list      indicated that he was taking both aspirin and Aggrenox, so the      aspirin was discontinued.  8. Deep vein thrombosis with tiny  pulmonary emboli in the left lung.      A computed tomography angiogram on the day following admission      revealed tiny pulmonary emboli in the left upper lobe.  Lower      extremity Dopplers on February 08, 2008, revealed no evidence of DVT      on the right, but a nonocclusive DVT in the peroneal vein on the     left.  A lengthy discussion was had with the attending physician,      Lina Sayre, house staff, and the patient regarding      anticoagulation.  We consider the patient a poor candidate for      Coumadin therapy given his noncompliance with other medical      regimens as demonstrated by poor control of his diabetes and      hypothyroidism.  We feel that beginning Coumadin would put the      patient at severely increased risk of bleeding.  A compromise was      reached in the form of a brief course of outpatient anticoagulation      therapy.  Per Dr. Maurice March, we have elected to treat the patient for 14      days as an outpatient with subcutaneous unfractionated heparin.      Initially, Lovenox was ordered, but this is not covered under the      patient's insurance, so only unfractionated heparin was affordable.      We will  thus treat him via home health with unfractionated heparin.      An initial dose of 250 units per kilogram was ordered (25,000 units      subcutaneously every 12 hours), but heparin level on the day of      discharge was elevated, so this dose was reduced to 20,000 units      subcutaneously every 12 hours per pharmacist's recommendation.      After discussion with Harland German, PharmD, we have ordered a      single PTT level to be drawn on Friday, April 16.  If significantly      elevated, we will plan to readjust the heparin dose and check      another PTT prior to the end of therapy.  We recommend close      outpatient followup at the [facility] for repeat      lower extremity Dopplers in 1-2 months and to reassess his possible      need for  anticoagulation.  The only alternative to Coumadin would      likely be an inferior vena cava filter if chronic outpatient      anticoagulation with Lovenox or unfractionated heparin is not      feasible.  9. Hypertension.  The patient was treated during this admission with      lisinopril.  As his creatinine has returned to a normal range      around 1.1, we will restart his hydrochlorothiazide at discharge.      He should not be given AV nodal blockers secondary to his new      diagnosis of second-degree Mobitz type 1 AV block.  10.Gastroesophageal reflux disease.  The patient was continued on      Protonix during this admission.   DISCHARGE LABS:  Laboratory studies on the day of discharge revealed:  Sodium 131, potassium 4.1, chloride 92, bicarbonate 31, BUN 10,  creatinine 1.12, glucose 266, and calcium 9.3.  White blood cell count  7.4, hemoglobin 12.1, and platelet count 191,000.  Total bilirubin 0.7,  alkaline phosphatase 114, AST 26, ALT 26, total protein 6.0, and albumin  2.9.   DISCHARGE VITAL SIGNS:  Vital signs on the day of discharge were:  Temperature 98.4 degrees Fahrenheit, blood pressure 139/82, pulse 61,  respiration rate 18, and oxygen saturation 99% on room air.  Capillary  blood glucoses on the day of discharge were 201, 278, 329 and 330, so  the patient's Lantus has been increased to 50 units nightly.   DISPOSITION AND FOLLOWUP:  The patient is scheduled for a single  hospital followup visit with Dr. Maryland Pink in the [facility] on May 13 at 1:30 p.m..  He was has also been  instructed to call the [facility] at [phone] and make  a followup appointment as soon as possible.  He will be seen by [facility] (phone number [phone]) for the next 14 days for his heparin  injections as described above.  It is of up most importance that he  followup at the [facility] with his primary care physician   for adjustment of his medications and further evaluation for ongoing  anticoagulation.      Madelaine Etienne, MD  Electronically Signed      Fransisco Hertz, M.D.  Electronically Signed    JH/MEDQ  D:  02/10/2008  T:  02/11/2008  Job:  [id number]

## 2011-03-12 NOTE — Discharge Summary (Signed)
NAMEBernita Raisin NO.:  [id number]   MEDICAL RECORD NO.:  [id number]          PATIENT TYPE:  INP   LOCATION:  5148                         FACILITY:  [facility]   PHYSICIAN:  Monte Fantasia, MD  DATE OF BIRTH:  September 09, 1946   DATE OF ADMISSION:  08/28/2008  DATE OF DISCHARGE:                               DISCHARGE SUMMARY   A 65 year old African American male was admitted in view of the  ulceration of great toe for which the MRI and MRA of the lower  extremities showed mild subcutaneous edema with proper suspicion of  cellulitis with nondisplaced avulsion and fracture of the base of the  fifth metatarsal.  The patient received IV antibiotics with vancomycin  and Zosyn, and also received physical therapy and occupational therapy.  The patient had blood cultures and wound cultures, which were negative  and there is no overall change from yesterday's plan for discharge.  The  patient is planned for discharge today.   PHYSICAL EXAMINATION:  VITAL SIGNS:  Temperature 98.5, pulse of 67,  respirations 18, blood pressure is 128/77, and oxygen saturation 100% on  room air.  HEENT:  Normocephalic, atraumatic.  No icterus.  No lymphadenopathy.  No  JVD.  Pupils equal and reacting to light.  CARDIOVASCULAR:  S1 and S2 normal.  Regular rate and rhythm.  LUNGS:  Clear to auscultation.  No rales or rhonchi.  ABDOMEN:  Soft.  No organomegaly.  No distention.  No tenderness.  EXTREMITIES:  Mild ulceration on the right lower extremity.  Clean wound  dressing given.   DISCHARGE DIAGNOSES:  1. Ulceration of the right great toe.  2. Possible fracture of the fifth metatarsal.  3. Diabetes type 2.  4. Mild dehydration.  5. Hypertension.  6. History of deep venous thrombosis.  7. Hyperlipidemia.   PLAN:  To discharge the patient today.   DISCHARGE MEDICATIONS:  1. Lantus insulin 60 units subcutaneous q.12, regular insulin 10 units      subcutaneous 3 times daily with  meals.  2. Heparin 250 subcutaneous q.12 per admission.  3. Lipitor 10 mg p.o. daily.  4. Nifedipine 5 mg take 2 daily.  5. Doxycycline 100 mg p.o. b.i.d. for 10 days.      Monte Fantasia, MD  Electronically Signed     MP/MEDQ  D:  08/31/2008  T:  09/01/2008  Job:  [id number]

## 2011-03-12 NOTE — Consult Note (Signed)
NAMEADHAM, JOHNSON NO.:  [id number]   MEDICAL RECORD NO.:  [id number]          PATIENT TYPE:  INP   LOCATION:  5531                         FACILITY:  [facility]   PHYSICIAN:  Jonelle Sidle, MD DATE OF BIRTH:  [birth year]   DATE OF CONSULTATION:  DATE OF DISCHARGE:                                 CONSULTATION   REQUESTING PHYSICIAN:  Rosanna Randy, MD   CARDIOLOGIST:  Bevelyn Buckles. Bensimhon, MD   PRIMARY CARE PHYSICIAN:  [facility].   REASON FOR ADMISSION:  Chest pain and history of syncope.   HISTORY OF PRESENT ILLNESS:  [name] is a chronically ill 65 year old  male with a reported history of coronary artery disease status post  previous percutaneous intervention with stent placement at the [facility] several years ago (details are not  clear).  He was seen in consultation by Dr. Gala Romney back in April 2008  at which time he had minimally abnormal troponin I levels and ultimately  underwent a Myoview, which reported no frank ischemia with ejection  fraction of 42%.  Echocardiography done around that same time, however,  demonstrated normal left ventricular systolic function at 60% without  any regional wall motion abnormalities and it was felt that medical  therapy was most appropriate.  Additional problems include  cerebrovascular disease with history of stroke, hypertension, type 2  diabetes mellitus, hyperlipidemia, hypothyroidism that is very poorly  controlled, carotid artery disease on the left at 70%, post traumatic  stress disorder, and mild renal insufficiency.  He is now admitted to  the hospital following an episode of apparent syncope.   In reviewing the history and physical and speaking with the patient, he  apparently was seated at home and after taking a dose of Cialis and  apparently a dose of insulin, he slumped over and was as described by  family members as being  unconscious.  Based on available information, he  was not markedly hypoglycemic and per EMS had a blood pressure of  90/50.  Since that time, he has not had any marked hypotension noted  and his frank orthostatic assessments were unrevealing.  He seems to  indicate that he had an episode of chest pain following the episode of  syncope, not proceeding at, although has had none since that time.  He  does state that he has been taking Cialis regularly for the last 7 days.  His electrocardiogram shows sinus rhythm with low voltage and incomplete  right bundle branch block pattern.  There are inferior Q-waves  predominantly in III and AVF and diffuse nonspecific ST-T wave changes  with left anterior fascicular block.  The incomplete right bundle branch  block pattern is somewhat more pronounced compared to his old tracing,  although the inferior changes are old.  Telemetry shows sinus rhythm.  The patient's cardiac markers have again been minimally abnormal with a  troponin I level of 0.07 (similar to last time) and also of note, the  patient has a markedly elevated TSH level of 123 and hemoglobin A1c of  14%.  His D-dimer was elevated at 3.71 and I noted a CT scan report done  overnight that they suggestive of tiny embolic burden to the left  upper lobe raising the possibility of a degree of pulmonary emboli,  although no large central clots were noted and the study was moderately  limited.  He is also described as having cardiomegaly and coronary  artery calcifications.   On further review of the patient's telemetry strips from this afternoon,  I see an episode of what looks to be second-degree type 1 heart block  (Wenckebach), which was asymptomatic in the patient's perspective.  I do  see that he was started on Lopressor today and he is not on any other  rate controlling medications at home.  I see that he also has a  documented history of bradycardia.   ALLERGIES:  No known drug  allergies.   PRESENT MEDICATIONS:  1. Aspirin 81 mg p.o. daily.  2. Aggrenox one p.o. b.i.d.  3. Lipitor 40 mg p.o. nightly.  4. Enoxaparin at treatment dose 110 mg subcu b.i.d.  5. NovoLog sliding scale.  6. Levothyroxine 125 mcg p.o. daily.  7. Lisinopril 20 mg p.o. daily.  8. Metoprolol 12.5 mg p.o. b.i.d.  9. Protonix 40 mg p.o. daily.  10.MiraLax 17 g p.o. daily.  11.Potassium supplementation.  12.Tylenol p.r.n.  13.Ativan p.r.n.   PAST MEDICAL HISTORY:  As outlined above.   ADDITIONAL PROBLEMS:  1. Previous documentation of enterococcal urinary tract infection.  2. Gastroesophageal reflux disease.  3. Hernia repair.  4. Previous known grafting to chest area.  5. Chronic tremor.  6. Sphenoid sinusitis.   SOCIAL HISTORY:  The patient is divorced.  He was previously in [employer]  and in [place].  He lives with his brother here in [place].  Drinks  alcohol regularly and quit smoking many years ago.   FAMILY HISTORY:  Reviewed.  Significant for diabetes mellitus  predominantly.   REVIEW OF SYSTEMS:  As outlined above.  He has had some nonproductive  cough, but no over the last few days.  He has constipation, history of  ventral hernia, intermittent abdominal discomfort, headache, some  numbness and pain in his legs, particularly left foot area   PHYSICAL EXAMINATION:  VITAL SIGNS:  Temperature is 97.6 degrees, heart  rate is in the 80s typically in sinus rhythm, respirations 18, blood  pressure is 127/73, oxygen saturation is 94% on 2 liters of nasal  cannula.  GENERAL:  This is a chronically ill-appearing male, overweight in no  acute distress.  HEENT:  Conjunctiva, lids normal.  Pharynx clear.  NECK:  Supple.  No elevated jugular venous pressure.  No loud bruits are  evident, perhaps soft on the left.  There is no thyroid tenderness  noted.  LUNGS:  Exhibit diminished breath sounds particularly at the base.  CARDIAC:  Regular rate and rhythm.  No loud murmur or  S3 gallop.  No  pericardial rub is evident.  ABDOMEN:  Soft, no tenderness, ventral hernia without tenderness.  No  rebound or guarding.  EXTREMITIES:  Exhibit no frank pitting edema.  There is no cords on  palpation of the calves.  Distal pulses are diminished at 1+.  SKIN:  Otherwise dry.  MUSCULOSKELETAL:  No kyphosis noted.  NEUROPSYCHIATRIC:  The patient is alert and oriented x3, somewhat  decreased strain on the left-side, arms greater than legs, which is  reportedly old.   LABORATORY DATA:  WBC is 8.0, hemoglobin 10.6,  hematocrit 32.1,  platelets 159.  D-dimer 3.71.  Sodium 133, potassium 3.8, chloride 98,  bicarb 27, glucose 295, BUN 14, creatinine 1.26, hemoglobin A1c 14.0%.  CK and CK-MB levels are normal.  Peak troponin I 0.07.  Total  cholesterol 303 with an LDL cholesterol of 220.  TSH is 123.  Urine  microalbumin level high at 29.   IMPRESSION:  1. Recent episode of apparent syncope, etiology not entirely certain.      Several possibilities are to be considered.  The patient does have      an abnormal chest CT scan suggesting perhaps a tiny embolic burden      affecting the subsegmental left upper lobe vasculature.  This      possibility has had thromboembolic disease, although not clear that      this is related to the patient's episode of chest pain or syncope.      He has also had previous problems with hypoglycemia, although did      not appear to be markedly hyperglycemic around the time of his      event.  He has had an episode of second-degree type 1 heart block      observed on telemetry, although apparently this was not symptom      provoking and also in the setting of recently started beta-blocker      therapy.  Several of his laboratory abnormalities point towards      medication noncompliance and he would appear to be markedly      hypothyroid, which may also contribute to some of his symptoms.  He      has had no obvious pauses or tachy arrhythmias and his  minor      troponin I changes are in the line with the changes noted last      April.  He does have a history of cardiovascular disease and did      have chest pain, although his electrocardiogram looks fairly      stable, although with low voltage which may well be related to his      hypothyroid state.  His last ejection fraction was normal with no      wall motion abnormalities by echocardiography in April of last      year.  It is also interesting to know that he has been on Cialis      recently and one wonders whether he had a drop in his blood      pressure associated with this medication since he stated that the      event occurred shortly after taking his Cialis dose in the morning.  2. Reported history of cardiovascular disease.  Apparently status post      previous stent placement several years ago at the [facility].  Details are not available.  The patient      had a nonischemic Myoview in August 2008.  3. Poorly controlled type 2 diabetes mellitus, hypertension,      hyperlipidemia, and hypothyroidism.  4. Known cerebrovascular disease with carotid artery disease.   RECOMMENDATIONS:  Would consider further evaluation of the abnormal CT  scan findings as the patient may need to be treated for thromboembolic  disease.  The radiology report suggest potentially a repeat study.  A VQ  scan could alternatively be considered and lower extremity Dopplers may  also be of use.  For the time being he is on treatment dose Lovenox.  The patient's second-degree type 1 heart block episode was asymptomatic  in the setting of a new beta blocker, so it is not entirely clear  whether this has clinical bearing on the patient's presentation.  I  would obviously stop his beta blocker and watch telemetry at this point.  I agree that a followup echocardiogram would be useful.  If he has had  significant change in his ejection fraction or new wall motion   abnormalities, we may feel more strongly about an ischemic etiology.  Otherwise, if this remains stable it may be worth considering a followup  Myoview at least to reassess his ischemic burden.  He clearly also needs  management of his diabetes mellitus, hypertension, hyperlipidemia, and  hypothyroidism, which are all contributing to his chronic illness and  seem to be very poorly controlled suggesting indefinite element of  noncompliance.  We will plan to follow with you.      Jonelle Sidle, MD  Electronically Signed     SGM/MEDQ  D:  02/06/2008  T:  02/07/2008  Job:  [id number]

## 2011-03-12 NOTE — H&P (Signed)
NAMENIKOLA, [name]                 ACCOUNT NO.:  [id number]   MEDICAL RECORD NO.:  [id number]          PATIENT TYPE:  INP   LOCATION:  3034                         FACILITY:  [facility]   PHYSICIAN:  Santiago Bumpers. Hensel, M.D.DATE OF BIRTH:  07/18/1946   DATE OF ADMISSION:  06/05/2007  DATE OF DISCHARGE:                              HISTORY & PHYSICAL   CHIEF COMPLAINT:  Weakness.   HISTORY OF PRESENT ILLNESS:  This is a 65 year old male who is a very  poor historian here with a questionable stroke.  Per his family he had  normal walking yesterday, however, today he was unable to walk.  Over  the past 2 weeks he is getting worse.  His family thinks he had  another stroke.  His last one was about a month ago.  His speech has  been difficult since his prior stroke.  The family thinks he has been  more confused lately.  He fell out of bed yesterday.  He did not hit his  head.  He more slid out.  The patient states he has a headache.  He  denies shortness of breath, denies chest pain.  He complains of leg pain  bilaterally and points to his left knee.  Per records he was admitted  previously for weakness and dysarthria in the past with a physical  examination similar to today.  Neither the family nor the patient are  good historians or unable to completely tell how much of a change the  patient is past his previous baseline.   PAST MEDICAL HISTORY:  1. Hypertension.  2. Diabetes type 2.  3. Hyperlipidemia.  4. CVA.  5. PTSD.  6. Hypothyroid.  7. Carotid stenosis.   MEDICATIONS:  1. Insulin, regular 70 units.  2. Aspirin 81 mg daily.  3. Ezetimibe 10 mg daily.  4. Zocor daily.  5. Gabapentin 300 mg daily.  6. Levothyroxine 125 mcg daily.  7. Lorazepam 0.5 mg daily.  8. Potassium chloride 20 daily.  9. Sertraline 100 mg p.o. daily.  10.Trazodone 200 q.h.s.  11.Insulin N 30 units t.i.d.  12.Lisinopril 40 mg p.o. daily.   Please note that the insulin amounts are uncertain as there  are  discrepancies between several dictations and what the ED has on record.   PAST SURGICAL HISTORY:  1. Herniorrhaphy.  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   SOCIAL HISTORY:  The patient lives with a brother who is sick with  cancer according to the family.  He denies alcohol, drugs, tobacco use.   ALLERGIES:  NO KNOWN DRUG ALLERGIES.   FAMILY HISTORY:  This is deferred as the patient is unable to answer.   REVIEW OF SYSTEMS:  Difficult to obtain.  Please see HPI.  The patient  complains of hiccups since yesterday.  Per his family he has sores on  his penis.  He also complains of constipation with no bowel movement for  2 days.  The patient denies abdominal pain.   PHYSICAL EXAMINATION:  VITAL SIGNS:  Heart rte 94, blood pressure  137/78, oxygen is 98 on 4  liters.  Temperature is 98.2.  GENERAL:  Not in acute distress. He has active hiccups.  HEENT:  Pupils equal, round and reactive to light and accommodation.  Extraocular muscles intact.  Throat poor dentition.  No erythema, no  exudate.  NECK:  Negative thyromegaly.  CARDIOVASCULAR:  Regular rate and rhythm.  No rales, gallops, murmurs.  PULMONARY:  There are crackles at the bilateral bases. No increased work  of breathing.  Otherwise clear to auscultation.  ABDOMEN:  Obese, soft, nontender. Positive bowel sounds, although they  are hypoactive.  NEUROLOGIC:  Cranial nerves II-XII are intact. The patient is oriented  to person and time, not place.  There are no focal deficits.  No visual  field defects.  The patient does have dysarthria with some word finding  difficulties.  MUSCULOSKELETAL:  Strength is 4/5 bilateral lower extremities and upper  extremities 5/5.  GENITOURINARY:  Positive fecal occult blood with hard stool in the  vault.  There are 2 vesicular-appearing sores on the shaft of the penis.  The patient was disimpacted at bedside.  EXTREMITIES:  Full range of movement, nontender lower extremities.  There are  no contusions or deformities noted in the lower extremities.  His upper extremities are within normal limits.  SKIN: There are no decubitus ulcers noted.  PSYCH:  The patient has a flat affect.   X-RAYS/LAB DATA:  Chest x-ray no acute findings.  White blood cells are  elevated at 18.9, hemoglobin is low at 12.5, hematocrit 36.6, with 82%  neutrophils, INR is 1.1, point of cares are negative with troponin less  than 0.05, myoglobin is mildly elevated at 209.   ASSESSMENT/PLAN:  This is a 65 year old male with a questionable stroke,  weakness, increased white count.  1. Weakness.  Given the patient's extensive history of multiple      strokes it is quite possible he had another stroke yesterday.      After review of records, however, his neurological status appears      quite unchanged with the exception of increased confusion and there      may be more dysarthria.  We will continue aspirin, however, we      cannot increase the dose or add on Plavix or Aggrenox as the      patient is fecal occult blood positive.  We will have physical      therapy and occupational therapy evaluate the patient.  No further      images are needed now as his physical examination is unremarkable.  2. Increased white blood cells.  With suspected infectious etiology we      will check a urine with Gram stain and cultures and sensitivities.      We will get a chest x-ray to evaluate for pneumonia.  We will get      an abdominal series to evaluation for constipation and abdominal      process.  We will monitor for fevers.  We will go ahead and start      Rocephin and azithromycin for acute acquired pneumonia and also to      treat for possible urinary tract infection until results are back.      If gram-negative rods on Gram stain we will add ampicillin.  Lumbar      puncture if no improvement and headache continues without a known      source.  3. Diabetes.  We will hold his insulin, long-acting until  tolerating  p.o. intake.  Sliding scale moderate for now.  We will adjust home      regimen based on blood glucoses.  4. Fecal occult blood positive.  We will monitor his CBC.  Likely      stable and can have outpatient workup.  Hold Lovenox.  5. Sores on penis.  The patient is not bothered by them during the      physical examination.  I can discuss this with the attending,      however, no treatment necessary at this time.  6. Fluids, electrolytes, nutrition, gastrointestinal.  P.o. diet.  7. Prophylaxis.  Protonix and sequential compressive devices.  8. Constipation.  Disimpacted at bedside.  We will start MiraLax and      Senna and get abdominal radiographs.  9. Hyperlipidemia.  Home meds Zetia and Zocor are continued.   DISPOSITION:  It is unclear at this point if the patient is at his  baseline or not in regards to his physical abilities, mental abilities.  We have physical therapy evaluate the patient as he may need placement.  It does not appear that his home situation is very adequate to take care  of him.      Johney Maine, M.D.  Electronically Signed      Santiago Bumpers. Leveda Anna, M.D.  Electronically Signed    JT/MEDQ  D:  06/06/2007  T:  06/07/2007  Job:  [id number]

## 2011-03-12 NOTE — Discharge Summary (Signed)
[name], BURAK NO.:  [id number]   MEDICAL RECORD NO.:  [id number]          PATIENT TYPE:  INP   LOCATION:  3034                         FACILITY:  [facility]   PHYSICIAN:  Ardeen Garland, MD       DATE OF BIRTH:  1946-06-23   DATE OF ADMISSION:  06/05/2007  DATE OF DISCHARGE:  06/11/2007                               DISCHARGE SUMMARY   PRIMARY CARE PHYSICIAN:  The patient is seen at the [facility] in [place].   CONSULTATIONS:  None.   PROCEDURE:  None.   REASON FOR ADMISSION:  1. Weakness.  2. Increased white count.  3. Questionable altered mental status.   PERTINENT ADMISSION LABS:  White blood cells of 18.9, hemoglobin 12.5,  creatinine 1.46, glucose 321, TSH 8.114.  The patient was fecal occult  blood positive.   ADMISSION STUDIES:  Head CT was negative for acute intracranial process  or bleed.  An MRI was done showing a possible small right sub-insular  infarct and diffuse small vessel disease.   PRIMARY DISCHARGE DIAGNOSES:  1. Pyelonephritis.  2. Diabetes mellitus type 2 uncontrolled.  3. Hypertension.  4. Hypothyroidism.  5. Deconditioning.  6. Acute renal failure.   SECONDARY DISCHARGE DIAGNOSES:  Hyperlipidemia.   MEDICATIONS:  Medications stopped and not restarted at discharged:  Lisinopril 40 mg.  We did hold this medicine even upon discharge as the  patient was dehydrated and had increased creatinine.   NEW OR CHANGED MEDICATIONS ON DISCHARGE:  1. Bactrim-DS b.i.d.  The patient should take this through June 19, 2007 to complete a 14-day antibiotic course for his pyelonephritis.  2. Lantus 50 units b.i.d.  This was an increased dose for him as his      blood sugars were exceptionally hard to control.   HOME MEDICATIONS CONTINUED UPON DISCHARGE:  1. NovoLog 6 units before every meal.  2. Aspirin 81 mg daily.  3. Zetia 10 mg daily.  4. Zocor 10 mg daily.  5. Neurontin 300 mg daily.  6. Levothyroxine 125 mcg daily.  7. Lorazepam 0.5  mg daily.  8. Potassium chloride 20 mEq daily.  9. Zoloft 100 mg daily.  10.Trazodone 200 mg q.h.s.   BRIEF HOSPITAL COURSE:  This is a 65 year old male admitted primarily to  rule out a CVA, found to have pyelonephritis and to be deconditioned.  1. Pyelonephritis:  Regarding his pyelonephritis, he was started on      Rocephin.  His cultures did grow out E. coli that was sensitive to      Rocephin and Bactrim among other oral medications.  The patient was      started on Bactrim-DS b.i.d. for a 14-day total antibiotic course.      This would mean the patient should take his antibiotics through      June 19, 2007.  The patient did quickly become afebrile and his      white count did decrease to normal and the patient remained      asymptomatic.  2. His diabetes mellitus:  His CBGs in the  hospital were extremely      elevated in the mid-200s to over 400.  He was just covered with      sliding-scale insulin when he initially came into the hospital and      his CBGs were noticed to be very elevated.  He was started on      Lantus 25 units b.i.d. Per a previous discharge summary      recommendation and NovoLog 6 units before every meal.  We did go by      the previous discharge summary as the patient was unsure of his      home medication regimen.  We did eventually increase his Lantus to      50 units b.i.d. and eventually got his fasting blood sugars down to      the 160s.  We did not increase his Lantus any further as we do      believe some of his hyperglycemia could be secondary to this      infection and I do not want to drop him low as this infection      continues to resolve.  The patient states that he does go to the [facility]      in [place] for his diabetes management.  We did get a diabetic      educator for him while he was here, as we felt that he was perhaps      not understanding or taking his regimen correctly at home or      following a good diabetic diet.  She did talk with the  patient and      he stated that he does take his insulin regularly and that he would      follow up with his primary physician regarding his blood sugars.  3. Hypertension:  The patient was on lisinopril 40 mg daily.  We did      hold it initially for increased creatinine.  We did end up      restarting it, however, quickly the next day, his creatinine did      bump more than 30%, therefore, we held it again and did not restart      him on discharge.  His blood pressures were running up to the 150s.      We felt the patient was stable for now and it would be better to      hold the lisinopril until his kidney function improves.  He should      follow this up with his primary physician.  4. Regarding hypothyroidism, his TSH was elevated on admission,      although we question medication compliance at home, therefore, we      just started his home dose and the patient should follow up with      another TSH at his primary care physician's office.  5. The patient was found to be deconditioned and had difficulty      walking as well as performing several of his ADLs.  We did get      PT/OT for him while he was in the hospital, who recommended a      discharge to inpatient rehab.  6. Chronic and acute renal insufficiency:  His creatinine was 1.46      when he was admitted.  We did give him fluids.  It did decrease      eventually to 1.10 on June 09, 2007.  We then did restart his  lisinopril, however, his creatinine bumped and his creatinine was      1.60 on day of discharge.   PERTINENT DISCHARGE LABS:  Includes a creatinine of 1.60 and a fasting  glucose of 164.   CONDITION ON DISCHARGE:  Stable.   DISPOSITION:  The patient was discharged to [facility].   FOLLOWUP APPOINTMENTS:  The patient should follow up with his primary  care physician at the [facility] within one to two weeks of his discharge  from rehab.   FOLLOWUP ISSUES:  1. Diabetes regimen.  2. A BMET  to check his creatinine.  3. Discussion of possible reinitiation of his ACE inhibitor.  4. A possible increase in his statin and Zetia as they are at low      doses and the patient might benefit from increasing their doses.  5. It would be recommended to follow up on his thyroid function as he      was hypothyroid when he came in to the hospital.  We believe it was      due to medication compliance; however, if the patient had been      taking his medications, he would then need an increased dose of      levothyroxine.      Ardeen Garland, MD  Electronically Signed     LM/MEDQ  D:  06/11/2007  T:  06/12/2007  Job:  [id number]   cc:   [facility]

## 2011-03-12 NOTE — Consult Note (Signed)
[name], [name]:  [id number]   MEDICAL RECORD NO.:  [id number]          PATIENT TYPE:  INP   LOCATION:  4714                         FACILITY:  [facility]   PHYSICIAN:  Antonietta Breach, M.D.  DATE OF BIRTH:  1946-02-07   DATE OF CONSULTATION:  09/16/2007  DATE OF DISCHARGE:  09/18/2007                                 CONSULTATION   REQUESTING PHYSICIAN:  Acey Lav, MD.   REASON FOR CONSULTATION:  1. Mental status changes.  2. PTSD.  3. Assess capacity.   HISTORY OF PRESENT ILLNESS:  [name] is a 65 year old male  admitted to the [facility] on the 15th of November, 2008, due to  hypoglycemia and acute mental status changes.   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  He  is maintained on Zoloft 100 mg daily.  Please see the Past Psychiatric  History.  He is not having any intrusive recollections of trauma.  He is  not having feeling on edge.  His energy is decreased but his mood is not  depressed.  His concentration is decreased.  He does not have anhedonia.  He is not having suicidal thoughts or thoughts of harming others.  He  has no hallucinations or delusions.   He is cooperative with bedside care without combativeness.  He is not  having any psychotropic medication side effects.   However, the patient does have impairment in his judgment and executive  function.  He has demonstrated difficulty in being able to perform the  basic calculations and retain the basic information required in self-  administrating insulin.  This has been confirmed by the elevation of his  hemoglobin A1c as well as observation by the current medical staff and  nursing staff.  The patient does also demonstrate significant impairment  in memory on his mental status exam, although he can recall a partial  amount of events.   PAST PSYCHIATRIC HISTORY:  The  patient confirms posttraumatic stress  history after his military experience.  His remote memory is better than  recent.  He has obtained good results from his Zoloft 100 mg daily.   In review of the past medical record, there is a documented history of  treatment with trazodone 200 mg at bedtime in addition to Zoloft 100 mg  daily in April of 2008.  The diagnosis of posttraumatic stress disorder  is listed in the medical record of that time.   FAMILY PSYCHIATRIC HISTORY:  None known.   SOCIAL HISTORY:  1. [name] is listed as separated from his wife.  2. He lived with his brother for some time.  3. He also has a daughter-in-law and a son living locally.  4. No alcohol or illegal drugs.  5. Occupation:  Retired.  Patient is a Designer, multimedia.   PAST MEDICAL HISTORY:  1. Insulin-dependent diabetes mellitus.  2. Hypertension.  3. Hypothyroidism.  4. Cerebrovascular disease.   No known drug allergies.   MEDICATIONS:  The  MAR is reviewed.  The patient is on Zoloft 100 mg  daily.   LABORATORY DATA:  Sodium 134, BUN 14, creatinine 1.04, WBC 6.7,  hemoglobin 12.1, platelet count 168,000.  Hemoglobin A1c 9.32.  Urine  drug screen normal.  Head CT without contrast no acute abnormalities.  SGOT 35, SGPT 24, TSH elevated at 23.1.  T4 confirmed decrease, 0.64.   REVIEW OF SYSTEMS:  CONSTITUTIONAL:  Afebrile, no weight loss.  HEAD:  No trauma.  EYES:  No visual changes.  EARS:  No hearing impairment.  NOSE:  No rhinorrhea.  MOUTH/THROAT:  No sore throat.  NEUROLOGIC:  No  new focal motor or sensory findings.  PSYCHIATRIC:  As above.  CARDIOVASCULAR:  No chest pain, palpitations.  RESPIRATORY:  No coughing  or wheezing.  GASTROINTESTINAL:  No vomiting.  GENITOURINARY:  No  dysuria.  SKIN:  Unremarkable.  ENDOCRINE/METABOLIC:  As above.  HEMATOLOGIC/LYMPHATIC:  Mild anemia.  MUSCULOSKELETAL:  No deformities.   EXAMINATION:  VITAL SIGNS:  Temperature 98.3, pulse 65,  respiratory rate  20, blood pressure 166/92.  The O2 saturation on room air 99%.  OTHER MENTAL STATUS EXAM:  [name] is an elderly male lying in the  right lateral decubitus position in his hospital bed.  He has no  abnormal involuntary movements.  OTHER MENTAL STATUS EXAM:  [name] is alert.  He has intact eye  contact.  He obviously is fatigued.  His attention span is mildly  decreased, concentration mildly decreased.  On orientation fasting he is  oriented to all spheres.  On memory testing, 3/3 immediate, only 1/3 at  5 minutes.  His fund of knowledge and intelligence are below that of his  estimated premorbid baseline.  His speech involves no dysarthria.  There  is a mildly flat prosody and soft volume.  Thought process is coherent.  No looseness of associations.  Language comprehension exceeds  expression; please see the above.  THOUGHT CONTENT:  No thoughts of harming himself.  No thoughts of  harming others.  No delusions.  No hallucinations.  Insight is poor for  his impairment and executive function and his inability to accurately  self-administer insulin.  JUDGMENT:  Is intact for basic cooperation but it is not intact for self-  administering insulin.   ASSESSMENT:  Axis I:  1. 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  The patient's hypothyroidism      may be significantly contributing to his decreased energy; however,      he does not appear to have depression at this time.  2. Rule out dementia not otherwise specified versus dementia secondary      to hypothyroidism.  With the patient's history of cerebrovascular      disease, diabetes as well as hypothyroidism, his cognitive and      memory impairments are likely secondary to multiple etiologies;      however, this cannot be confirmed.  The patient's mental status should be rechecked as his thyroid balance  is obtained.  Axis II:  None.  Axis III:   See General Medical section.  Axis IV:  General medical.  Axis V:  Global Assessment of Functioning 40.   [name] cannot self-administer insulin.  He does not have the capacity  to choose his discharge environment due to his inability to appreciate  his disability in self-administering insulin.   If he has someone with him daily that can  administer his insulin  properly then he would be safe to go home because he can recall self-  administration of basic items such as tablets.   Otherwise, he would minimally need an assisted living facility.   RECOMMENDATION:  Would continue the Zoloft 100 mg daily for anti-  anxiety.   Of note, Zoloft does increase the body's sensitivity to insulin which  can be a positive long-term factor in the patient's insulin dependent  diabetes mellitus.   Regarding followup, would have this patient follow up with one of the  psychiatric clinics attached to [facility], [facility], or if [place] [facility] [place] [facility]  are an option.      Antonietta Breach, M.D.  Electronically Signed     JW/MEDQ  D:  10/12/2007  T:  10/12/2007  Job:  [id number]

## 2011-03-12 NOTE — Discharge Summary (Signed)
NAMEBernita Li NO.:  [id number]   MEDICAL RECORD NO.:  [id number]          PATIENT TYPE:  INP   LOCATION:  5148                         FACILITY:  [facility]   PHYSICIAN:  Lucita Ferrara, MD         DATE OF BIRTH:  01/15/1946   DATE OF ADMISSION:  08/28/2008  DATE OF DISCHARGE:                               DISCHARGE SUMMARY   ADDENDUM.   DISCHARGE MEDICATIONS:  Will be as follows:  1. Lantus insulin 60 units subcu every 12 hours to be continued on  2. Regular insulin 10 units subcu 3 times a day with meals.  3. Heparin 250 subcu every 12 hours per preadmission.  4. Lipitor 20 mg p.o. daily.  5. Nifedipine per preadmission dose daily.  Presumed that this is 5      mg.  6. New medication will be doxycycline 100 mg p.o. b.i.d. x10 days.      Lucita Ferrara, MD  Electronically Signed     RR/MEDQ  D:  08/30/2008  T:  08/30/2008  Job:  [id number]

## 2011-03-12 NOTE — Discharge Summary (Signed)
NAMEBernita Li NO.:  [id number]   MEDICAL RECORD NO.:  [id number]          PATIENT TYPE:  INP   LOCATION:  5148                         FACILITY:  [facility]   PHYSICIAN:  Lucita Ferrara, MD         DATE OF BIRTH:  May 17, 1946   DATE OF ADMISSION:  08/28/2008  DATE OF DISCHARGE:  08/30/2008                               DISCHARGE SUMMARY   DISCHARGE DIAGNOSES:  1. Ulceration of the right great toe.  2. Possible fracture of the fifth metatarsal.  3. Diabetes type 2, uncontrolled.  4. Mild dehydration.  5. Hypertension.  6. History of deep vein thrombosis.  7. Hyperlipidemia.   PROCEDURE:  The patient had a MRA, MRI of the lower extremity which  showed mild subcutaneous edema with associated low level enhancer  raising a suspicion of cellulitis, nondisplaced avulsion and fracture of  the base of the fifth metatarsal, hallux valgus, no signs of  osteomyelitis.  The patient had a chest x-ray August 28, 2008 which  shows cardiomegaly with no acute cardiopulmonary abnormalities.  The  patient had a foot x-ray which showed soft tissue swelling and  nondisplaced fracture.   HISTORY OF PRESENT ILLNESS:  [name] is a 65 year old African American  male who presented to [facility] complaining of swelling  of his right lower extremity.  Supposedly, the patient had a pimple-like  swelling that started on the right great toe and increased in size.  He  did not have any current purulent discharge, but had some blood that was  discharged from the area.  He denied any fevers or chills.  He was  brought to the emergency room and given his history of uncontrolled  diabetes he was admitted for possible infection and osteomyelitis.  Osteomyelitis was ruled out with an MRI and upon institution of IV  antibiotics with vancomycin and Zosyn the patient's symptoms resolved.  In addition, physical therapy and occupational therapy was ordered and  the patient started  ambulating with no problem.  He had two sets of  cultures that were requested and results are negative blood cultures x2  and negative wound cultures x2.  In addition to above the patient also  had some sort of fighting horseplay with his brother and supposedly he  also had some  bruising of the left lower extremity which is nonspecific  and well-controlled.  Currently, he will be discharged home with home  health wound care, physical therapy and occupational therapy.  He will  be discontinued from his IV medications and he is going to be put on  doxycycline 100 mg p.o. b.i.d. times 10 days.  He tells me that he has  an appointment tomorrow with [facility] .   PHYSICAL EXAMINATION ON DISCHARGE:  The patient is alert and oriented  x3.  Cranial nerves II-XII are grossly intact.  His cultures, as I said were negative.  Hemoglobin A1c of 11.2.  Complete metabolic panel shows mildly low sodium otherwise, BUN and  creatinine within normal limits.  The rest of the  labs are within normal  limits.  HEENT: Normocephalic, atraumatic.  Sclerae is nonicteric.  NECK:  Supple.  No JVD, carotid bruits.  PERLA, extraocular muscles intact.  CARDIOVASCULAR:  S1 and S2, regular rate and rhythm.  No murmurs, rubs  or clicks.  LUNGS:  Clear to auscultation. No rales, rhonchi or wheezes.  EXTREMITIES:  Right lower extremity mild ulceration otherwise, normal.   His lipid panel is very elevated, LDL of 157.   ISSUES TO BE ADDRESSED AT PRIMARY CARE FACILITY:  Uncontrolled diabetes  type 2.  I have explained to the patient that control of diabetes is  best managed in an outpatient setting and cannot be managed in an  inpatient setting as the control of diabetes needs to be assessed every  3 months with changes in medication.  In addition, he needs better  control of routine diabetic care including yearly eye exam, monitor of  his kidneys and renal function for such dysfunction.   DISCHARGE CONDITION:   Stable.   DISCHARGE DIET:  Low-fat, low-cholesterol diabetic diet.   DISCHARGE ACTIVITY:  Increase activity slowly.   DISCHARGE/PLAN:  PT, OT at home and home health wound care.      Lucita Ferrara, MD  Electronically Signed     RR/MEDQ  D:  08/30/2008  T:  08/30/2008  Job:  [id number]

## 2011-03-15 NOTE — Op Note (Signed)
   NAMELAWRNCE, REYEZ                             ACCOUNT NO.:  [id number]   MEDICAL RECORD NO.:  [id number]                   PATIENT TYPE:  AMB   LOCATION:  NESC                                 FACILITY:  [facility]   PHYSICIAN:  Lindaann Slough, M.D.               DATE OF BIRTH:  1946/09/18   DATE OF PROCEDURE:  07/25/2003  DATE OF DISCHARGE:                                 OPERATIVE REPORT   PREOPERATIVE DIAGNOSIS:  Chronic balanitis.   POSTOPERATIVE DIAGNOSIS:  Chronic balanitis.   PROCEDURE:  Circumcision.   SURGEON:  Lindaann Slough, M.D.   ANESTHESIA:  General.   INDICATIONS FOR PROCEDURE:  The patient is a 65 year old male who has been  complaining of irritation of his foreskin. He was found on physical  examination to have balanitis. He was treated with Mycolog ointment. He is  now scheduled for circumcision.   DESCRIPTION OF PROCEDURE:  Under general anesthesia, the patient was prepped  and draped and placed in the supine position. Two circumferential incisions  were made on the foreskin and the foreskin in between those two incisions  was excised. Hemostasis was secured with electrocautery. Skin approximation  was then done with 4-0  Chromic. A penile block was then done with 0.5%  Marcaine.   The patient tolerated the procedure well and left the OR in satisfactory  condition to post anesthesia care unit.                                               Lindaann Slough, M.D.    MN/MEDQ  D:  07/25/2003  T:  07/25/2003  Job:  [id number]

## 2011-03-15 NOTE — Discharge Summary (Signed)
[name], [name]                 ACCOUNT NO.:  [id number]   MEDICAL RECORD NO.:  [id number]          PATIENT TYPE:  INP   LOCATION:  1422                         [facility]ILITY:  [facility]   PHYSICIAN:  Thomasenia Bottoms, MDDATE OF BIRTH:  Sep 06, 1946   DATE OF ADMISSION:  02/17/2007  DATE OF DISCHARGE:                               DISCHARGE SUMMARY   DISCHARGE DIAGNOSES:  1. 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  This will be placed in the      first or second week of May if family and patient are agreeable.  2. Severe weakness and debilitation.  No clear etiology for this, but      patient will go to skilled nursing home for rehabilitation.  3. Uncontrolled type 2 diabetes mellitus.  4. Uncontrolled hypertension.  5. Obesity.  6. Chronic severe tremor.  7. Sphenoid sinusitis, which was treated with Avelox.  8. Elevated troponin.  The patient was seen in consultation by      Cardiology.  He had a stress test, which was negative for any signs      of ischemia so it was felt that no further intervention was      required.  9. Acute renal insufficiency, which resolved.  10.Dehydration, which resolved.   CONSULTATIONS DURING HOSPITALIZATION:  [facility], Dr.  Gala Romney.   SUMMARY OF HOSPITAL COURSE:  1. [name] is a 65 year old gentleman, who presented with weakness      and was found to have severe hyperglycemia as he had not been      taking his insulin.  In the workup for his weakness, an MRI and MRA      of his brain was done.  The MRA revealed significant stenosis in      the internal carotid artery in the supraglenoid region, and      angiography was recommended.  At the time, the patient's BUN and      creatinine were slightly elevated so the angiography was held off      until he could be hydrated.  Once this happened, his BUN and      creatinine came back down to normal.  He then had the  angiography,      which revealed 80% stenosis of the left supraglenoid ICA.      Interventional Radiology felt that a stent was recommended for this      lesion.  They will discuss this with the patient and another family      member and make arrangements for that to happen in approximately a      week.  In the meantime, the patient's weakness remained severe.      His MRI did not reveal any evidence of focal stroke.  He did      receive some physical therapy here in the hospital and is not even      able to get to the side of the bed or sit up by himself, and      therefore a skilled nursing  home has been recommended.  The patient      was on aspirin 81 mg and that was increased to 325 mg.  2. Diabetes mellitus, type 2, uncontrolled, secondary to      noncompliance.  The patient was put on Lantus on arrival up to 80      units a day and continued to have significant hyperglycemia so the      Lantus dose was broken up and he was put on 45 units b.i.d.  This      actually led to some early morning hypoglycemia with blood sugars      in the 60s so his Lantus was decreased to 35 units b.i.d.  His      insulin will continue to need to be adjusted as his blood sugars      are monitored even after discharge.  3. Hypertension.  His medications did require some adjustment for his      blood pressure being slightly uncontrolled while he was in the      hospital.   Medications to be determined just prior to discharge.      Thomasenia Bottoms, MD  Electronically Signed     CVC/MEDQ  D:  02/25/2007  T:  02/25/2007  Job:  [id number]   cc:   [name].A.

## 2011-03-15 NOTE — Discharge Summary (Signed)
NAMESEITH, AIKEY NO.:  [id number]   MEDICAL RECORD NO.:  [id number]          PATIENT TYPE:  INP   LOCATION:  1422                         FACILITY[facility]   PHYSICIAN:  Isidor Holts, M.D.  DATE OF BIRTH:  06/23/1946   DATE OF ADMISSION:  02/17/2007  DATE OF DISCHARGE:  03/04/2007                               DISCHARGE SUMMARY   For discharge diagnoses, refer to interim discharge summary dated February 25, 2007, by Dr. Buena Irish.  In addition: Hypothyroidism.   DISCHARGE MEDICATIONS:  1. Zetia 10 mg p.o. daily.  2. Neurontin 300 mg p.o. daily.  3. Synthroid 125 mcg p.o. daily.  4. Zoloft 100 mg p.o. daily.  5. Zocor 10 mg p.o. q.h.s.  6. Trazodone 200 mg p.o. q.h.s.  7. Norvasc 10 mg p.o. daily.  8. Protonix 40 mg p.o. daily.  9. Lantus insulin 38 units subcutaneously q.12h.  10.NovoLog insulin 6 units subcutaneously before meals.  11.Nasonex nasal spray 1 spray each nostril b.i.d. to be discontinued      on Mar 26, 2007.  12.Enteric-coated Aspirin 325 mg daily.  13.Lisinopril 40 mg p.o. daily.  14.Senokot-S 1 p.o. q.h.s.  15.Miralax 17 g p.o. daily.   For consultations, admission history, and detailed clinical course,  refer to above-mentioned interim discharge summary.  In addition, for  the period from Feb 26, 2007 to Mar 03, 2007, the following are pertinent:  The patient remained clinically stable.  He underwent physical  therapy/occupational therapy.  Diabetes was addressed with carbohydrate  modified diet and titration of the patient's Lantus insulin, as well as  meal coverage with NovoLog, with satisfactory response.  TSH on Mar 01, 2007, was 24.730 consistent with significant hypothyroidism, indicative  of under-replacement with thyroid hormone.  His Synthroid has therefore  been increased to 125 mcg p.o. daily, and it was recommended that TSH be  rechecked in 4 weeks' time.  The patient continues on lipid-lowering  medication for  dyslipidemia.  Hypertension has remained controlled on  ACE inhibitor and calcium channel blocker.  I have had a discussion with  Dr. Julieanne Cotton, interventional radiologist on Mar 03, 2007, and he  has assured me that stenting of left internal carotid stenosis has been  scheduled for Mar 11, 2007.  The patient, of course, is significantly  deconditioned, has been evaluated by PT/OT, and it is felt he will need  a period of rehabilitation.   DISPOSITION:  The patient is considered clinically stable for discharge  to [facility] on Mar 04, 2007.   DIET:  Carbohydrate modified/healthy heart.   ACTIVITY:  As tolerated.  Otherwise, per PT/OT.   FOLLOWUP INSTRUCTIONS:  The patient is recommended to follow up  routinely with his primary MD.  In addition, he is scheduled for  possible stenting of left internal carotid artery stenosis on Mar 11, 2007, under the auspices of Dr. Julieanne Cotton, interventional  radiologist.  Per discussion with Dr. Corliss Skains, it is desirable that  the patient be admitted on Mar 10, 2007, to [facility] for the  procedure,  anticipated to be done the following day.  It is recommended  therefore that Dr. Corliss Skains be contacted, telephone number [phone], to confirm admission arrangements.      Isidor Holts, M.D.  Electronically Signed     CO/MEDQ  D:  03/03/2007  T:  03/03/2007  Job:  [id number]   cc:   Sanjeev K. Corliss Skains, M.D.  Fax: [phone]

## 2011-03-15 NOTE — Consult Note (Signed)
NAMEAUTHUR, CUBIT                 ACCOUNT NO.:  [id number]   MEDICAL RECORD NO.:  [id number]          PATIENT TYPE:  INP   LOCATION:  1422                         FACILITY:  [facility]   PHYSICIAN:  Bevelyn Buckles. Bensimhon, MDDATE OF BIRTH:  01-Apr-1946   DATE OF CONSULTATION:  02/19/2007  DATE OF DISCHARGE:                                 CONSULTATION   REQUESTING PHYSICIAN:  Marcellus Scott, M.D.   CARDIOLOGIST:  Macarthur Critchley. Shelva Majestic, M.D.   PRIMARY MEDICAL DOCTOR:  Is at the [facility] in [place].   REASON FOR CONSULTATION:  Chest pain and abnormal cardiac markers.   HISTORY OF PRESENT ILLNESS:  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  He also has a history of coronary artery disease  and reportedly he underwent cardiac catheterization and possibly a stent  at the [facility] approximately 1 or 2 years ago.  These records are  currently pending.   He was admitted yesterday with weakness and fatigue as well as  constipation.  This was in the setting of medication noncompliance.  He  was found to have markedly elevated blood sugar with a hyperosmolar  state.  He was hydrated and placed on insulin.  While admitted, he also  reported chest pain which he describes as a tightness.  He says this can  come and go at any time.  Occasionally it does occur with exertion and  happens 1-2 times a week.  Typically lasts a couple of minutes and  resolves spontaneously.  No clear participating factors and there is  occasionally some dyspnea associated with it but no radiation or  diaphoresis.   An EKG was checked.  It showed normal sinus rhythm with a left anterior  fascicular block.  There were no diagnostic ST-T wave abnormalities.  Cardiac markers are just minimally elevated with a troponin of 0.09,  0.07, and 0.07.  The CK-MBs were in the 5-6 range.   REVIEW OF SYSTEMS:  Is notable for diabetic neuropathy as well as  constipation and some abdominal fullness.  He denies heart failure  symptoms.  He has had a stroke and has some lower extremity weakness.  No fevers or chills.  No bright red blood per rectum.  No melena.  Remainder of review of systems is negative except for HPI and problem  list.   PAST MEDICAL HISTORY:  1. Diabetes, poorly controlled.  2. Hypertension.  3. Hyperlipidemia.  4. Obesity.  5. Coronary artery disease.      a.     Reported history of cardiac catheterization +/- angioplasty       1-2 years ago at the [facility].  6. History of CVA with lower extremity weakness.  7. Arthritis.  8. Hypothyroidism.  9. Post-traumatic stress disorder.   CURRENT MEDICATIONS INCLUDE:  1. Zetia 10.  2. Neurontin 300.  3. Synthroid 100.  4. Zocor 20.  5. Trazodone 200.  6. Lantus 50.  7. Norvasc 10.  8. Lovenox 40.  9.  Aspirin 81.  10.Protonix 40.   ALLERGIES:  No known drug allergies.   SOCIAL HISTORY:  He is single.  He lives with his brother.  He is  retired Sales executive.  He quit smoking and heavy alcohol intake in  1968.  Denies drug use.   FAMILY HISTORY:  Father had a history of diabetes and died at the age of  64 due to complications from diabetes.  Mother also has diabetes.   PHYSICAL EXAMINATION:  He is in no acute distress.  He is sitting up in  bed eating lunch.  He is rather stoic, somewhat difficult historian.  Blood pressure is 149/98, his heart rate is 83, he is satting 98% on  room air.  HEENT:  Normal.  NECK:  Is supple.  There is no obvious JVD.  Carotids are 2+ bilaterally  without any bruits.  There is no lymphadenopathy or thyromegaly  appreciated.  CARDIAC:  He has regular rate and rhythm with a soft S4.  No murmurs.  LUNGS:  Are clear.  ABDOMEN:  Is obese, nontender, nondistended.  There is no  hepatosplenomegaly, no bruits, no masses.  Good bowel sounds.  EXTREMITIES:  Are warm with  no cyanosis, clubbing, or edema.  Distal  pulses are diminished.  There is no rash.  NEURO:  He is alert and oriented times 3 with a flattened affect and,  perhaps, some mild dysarthria.  Appears to have some facial muscle  weakness.  Upper extremities have full strength.  Lower extremities are  mildly weak bilaterally.   An EKG shows normal sinus rhythm with a first-degree AV block at a rate  of 75.  There is also left anterior fascicular block.  No significant ST-  T abnormalities.  Chest x-ray shows low volumes with bibasilar  atelectasis.  White count 6, hemoglobin 13.3, platelets are 138,000,  hemoglobin A1c is 16.  Sodium is 127, potassium 3.6, BUN is 11,  creatinine is 0.74, glucose is 357, total cholesterol is 159,  triglycerides 138, HDL 32, LDL 99.  A CK-MB initially 131/4.9 then  173/6.2 then 169/5.7.  Troponin I is 0.09, 0.07, and 0.07.   ASSESSMENT AND PLAN:  Chest pain.  This has both typical and atypical  features.  His electrocardiogram is negative for active ischemia and his  cardiac markers are nonspecific.  At this point, we will plan adenosine  Myoview in the morning to further risk stratify.  We will also obtain  his records from Dr. Elyn Aquas office and the  [facility].  Would increase Zocor to get his LDL  below 70.  Would also suggest adding an angiotensin-converting enzyme  inhibitor to help control his blood pressure and also provide nephro  protection.  We will follow with you.      Bevelyn Buckles. Bensimhon, MD  Electronically Signed     DRB/MEDQ  D:  02/19/2007  T:  02/19/2007  Job:  [id number]   cc:   Macarthur Critchley. Shelva Majestic, M.D.  Fax: [phone]
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
## 2011-07-19 LAB — CBC
HCT: 36.7 — ABNORMAL LOW
Hemoglobin: 12.5 — ABNORMAL LOW
MCV: 83.9
Platelets: 141 — ABNORMAL LOW
RBC: 4.37
WBC: 6.2

## 2011-07-19 LAB — BASIC METABOLIC PANEL
CO2: 30
Calcium: 9.2
Creatinine, Ser: 1.72 — ABNORMAL HIGH
Glucose, Bld: 231 — ABNORMAL HIGH

## 2011-07-19 LAB — DIFFERENTIAL
Eosinophils Relative: 7 — ABNORMAL HIGH
Lymphocytes Relative: 47 — ABNORMAL HIGH
Lymphs Abs: 2.9
Monocytes Absolute: 0.5
Monocytes Relative: 8

## 2011-07-23 LAB — FOLATE RBC: RBC Folate: 1229 — ABNORMAL HIGH

## 2011-07-23 LAB — CBC
HCT: 33.9 — ABNORMAL LOW
HCT: 36.7 — ABNORMAL LOW
Hemoglobin: 11.3 — ABNORMAL LOW
Hemoglobin: 12.4 — ABNORMAL LOW
MCHC: 33.1
MCHC: 33.4
MCHC: 34
MCV: 85.8
Platelets: 165
Platelets: 191
RBC: 3.74 — ABNORMAL LOW
RBC: 4.26
RBC: 4.75
RDW: 17.4 — ABNORMAL HIGH
WBC: 6.2
WBC: 6.9
WBC: 8
WBC: 6.6
WBC: 7.4

## 2011-07-23 LAB — DIFFERENTIAL
Basophils Absolute: 0
Basophils Relative: 1
Eosinophils Absolute: 0.2
Eosinophils Relative: 3
Lymphocytes Relative: 43
Lymphs Abs: 3.6
Monocytes Relative: 8
Neutro Abs: 2.1
Neutrophils Relative %: 32 — ABNORMAL LOW

## 2011-07-23 LAB — COMPREHENSIVE METABOLIC PANEL
ALT: 23
ALT: 26
ALT: 32
AST: 27
AST: 23
Albumin: 3.5
Alkaline Phosphatase: 114
CO2: 28
CO2: 31
Calcium: 9.3
Calcium: 9.9
Chloride: 97
Creatinine, Ser: 1.41
Creatinine, Ser: 1.59 — ABNORMAL HIGH
GFR calc Af Amer: >60
GFR calc Af Amer: 53 — ABNORMAL LOW
GFR calc non Af Amer: 51 — ABNORMAL LOW
GFR calc non Af Amer: >60
Glucose, Bld: 251 — ABNORMAL HIGH
Glucose, Bld: 266 — ABNORMAL HIGH
Potassium: 4.1
Sodium: 131 — ABNORMAL LOW
Sodium: 134 — ABNORMAL LOW
Total Bilirubin: 0.4

## 2011-07-23 LAB — SODIUM, URINE, RANDOM: Sodium, Ur: 120

## 2011-07-23 LAB — LIPASE, BLOOD: Lipase: 15

## 2011-07-23 LAB — POCT I-STAT, CHEM 8
BUN: 41 — ABNORMAL HIGH
Calcium, Ion: 1.28
Creatinine, Ser: 1.8 — ABNORMAL HIGH
HCT: 44
Potassium: 5.6 — ABNORMAL HIGH
TCO2: 28

## 2011-07-23 LAB — URINE DRUGS OF ABUSE SCREEN W ALC, ROUTINE (REF LAB)
Barbiturate Quant, Ur: NEGATIVE
Benzodiazepines.: NEGATIVE
Marijuana Metabolite: NEGATIVE
Phencyclidine (PCP): NEGATIVE
Propoxyphene: NEGATIVE

## 2011-07-23 LAB — BASIC METABOLIC PANEL
CO2: 27
Calcium: 8.3 — ABNORMAL LOW
Chloride: 98
GFR calc Af Amer: >60
Glucose, Bld: 295 — ABNORMAL HIGH
Potassium: 3.8
Sodium: 133 — ABNORMAL LOW

## 2011-07-23 LAB — CARDIAC PANEL(CRET KIN+CKTOT+MB+TROPI)
CK, MB: 2.9
Relative Index: INVALID
Total CK: 98

## 2011-07-23 LAB — URINE MICROSCOPIC-ADD ON

## 2011-07-23 LAB — RAPID URINE DRUG SCREEN, HOSP PERFORMED
Amphetamines: NOT DETECTED
Benzodiazepines: NOT DETECTED
Cocaine: NOT DETECTED
Opiates: NOT DETECTED
Tetrahydrocannabinol: NOT DETECTED

## 2011-07-23 LAB — LIPID PANEL: Cholesterol: 303 — ABNORMAL HIGH

## 2011-07-23 LAB — MAGNESIUM: Magnesium: 1.7

## 2011-07-23 LAB — URINALYSIS, ROUTINE W REFLEX MICROSCOPIC
Bilirubin Urine: NEGATIVE
Bilirubin Urine: NEGATIVE
Glucose, UA: 250 — AB
Ketones, ur: NEGATIVE
Nitrite: NEGATIVE
Specific Gravity, Urine: 1.025
Urobilinogen, UA: 0.2
pH: 6.5

## 2011-07-23 LAB — PROTIME-INR
INR: 0.9
Prothrombin Time: 13.4

## 2011-07-23 LAB — CREATININE, URINE, RANDOM: Creatinine, Urine: 72.6

## 2011-07-23 LAB — IRON AND TIBC
Saturation Ratios: 20
UIBC: 210

## 2011-07-23 LAB — FOLATE: Folate: 13

## 2011-07-23 LAB — TSH: TSH: 123.427 — ABNORMAL HIGH

## 2011-07-23 LAB — MICROALBUMIN / CREATININE URINE RATIO
Microalb Creat Ratio: 379.1 — ABNORMAL HIGH
Microalb, Ur: 29.8 — ABNORMAL HIGH

## 2011-07-23 LAB — APTT
aPTT: 53 — ABNORMAL HIGH
aPTT: 79 — ABNORMAL HIGH

## 2011-07-23 LAB — HOMOCYSTEINE: Homocysteine: 12.4

## 2011-07-23 LAB — HEMOGLOBIN A1C
Hgb A1c MFr Bld: 14 — ABNORMAL HIGH
Mean Plasma Glucose: 421

## 2011-07-23 LAB — HEPARIN LEVEL (UNFRACTIONATED): Heparin Unfractionated: 1.38 — ABNORMAL HIGH

## 2011-07-23 LAB — D-DIMER, QUANTITATIVE: D-Dimer, Quant: 3.71 — ABNORMAL HIGH

## 2011-07-23 LAB — VITAMIN B12: Vitamin B-12: 1547 — ABNORMAL HIGH (ref 211–911)

## 2011-07-30 LAB — COMPREHENSIVE METABOLIC PANEL
ALT: 19
ALT: 21
AST: 17
AST: 20
Albumin: 3.6
Alkaline Phosphatase: 82
BUN: 23
CO2: 26
Calcium: 9.5
Calcium: 9.6
Calcium: 9.6
Chloride: 103
Creatinine, Ser: 1.54 — ABNORMAL HIGH
GFR calc Af Amer: 55 — ABNORMAL LOW
GFR calc Af Amer: >60
GFR calc non Af Amer: 51 — ABNORMAL LOW
Glucose, Bld: 396 — ABNORMAL HIGH
Potassium: 4.9
Sodium: 129 — ABNORMAL LOW
Sodium: 131 — ABNORMAL LOW
Sodium: 134 — ABNORMAL LOW
Total Protein: 6.4
Total Protein: 6.9

## 2011-07-30 LAB — URINE MICROSCOPIC-ADD ON

## 2011-07-30 LAB — POCT CARDIAC MARKERS
CKMB, poc: 6.1
Myoglobin, poc: 463
Troponin i, poc: <0.05

## 2011-07-30 LAB — DIFFERENTIAL
Eosinophils Absolute: 0.2
Eosinophils Relative: 3
Lymphocytes Relative: 39
Lymphs Abs: 2.4
Monocytes Relative: 9

## 2011-07-30 LAB — WOUND CULTURE

## 2011-07-30 LAB — CULTURE, BLOOD (ROUTINE X 2): Culture: NO GROWTH

## 2011-07-30 LAB — URINALYSIS, ROUTINE W REFLEX MICROSCOPIC
Bilirubin Urine: NEGATIVE
Hgb urine dipstick: NEGATIVE
Nitrite: NEGATIVE
Protein, ur: 100 — AB
Specific Gravity, Urine: 1.025
Urobilinogen, UA: 0.2

## 2011-07-30 LAB — GLUCOSE, CAPILLARY
Glucose-Capillary: 227 — ABNORMAL HIGH
Glucose-Capillary: 279 — ABNORMAL HIGH
Glucose-Capillary: 295 — ABNORMAL HIGH
Glucose-Capillary: 307 — ABNORMAL HIGH
Glucose-Capillary: 350 — ABNORMAL HIGH
Glucose-Capillary: 357 — ABNORMAL HIGH

## 2011-07-30 LAB — CBC
HCT: 34 — ABNORMAL LOW
Hemoglobin: 10.9 — ABNORMAL LOW
Hemoglobin: 11 — ABNORMAL LOW
MCHC: 32.5
MCHC: 32.7
MCHC: 32.7
MCV: 87.3
Platelets: 196
RBC: 3.8 — ABNORMAL LOW
RBC: 3.93 — ABNORMAL LOW
RDW: 16.5 — ABNORMAL HIGH
RDW: 16.6 — ABNORMAL HIGH
WBC: 6.4

## 2011-07-30 LAB — LIPID PANEL: HDL: 24 — ABNORMAL LOW

## 2011-07-30 LAB — B-NATRIURETIC PEPTIDE (CONVERTED LAB): Pro B Natriuretic peptide (BNP): <30

## 2011-07-30 LAB — HEMOGLOBIN A1C
Hgb A1c MFr Bld: 11.2 — ABNORMAL HIGH
Mean Plasma Glucose: 275

## 2011-07-30 LAB — PROTIME-INR: Prothrombin Time: 13.1

## 2011-08-02 LAB — DIFFERENTIAL
Eosinophils Relative: 6 % — ABNORMAL HIGH (ref 0–5)
Lymphocytes Relative: 43 % (ref 12–46)
Lymphs Abs: 2.6 10*3/uL (ref 0.7–4.0)

## 2011-08-02 LAB — CBC
MCHC: 32.2 g/dL (ref 30.0–36.0)
MCV: 86.3 fL (ref 78.0–100.0)
Platelets: 201 10*3/uL (ref 150–400)
RBC: 4.1 MIL/uL — ABNORMAL LOW (ref 4.22–5.81)

## 2011-08-02 LAB — COMPREHENSIVE METABOLIC PANEL
AST: 21 U/L (ref 0–37)
CO2: 29 mEq/L (ref 19–32)
Calcium: 9.4 mg/dL (ref 8.4–10.5)
Creatinine, Ser: 1.23 mg/dL (ref 0.4–1.5)
GFR calc Af Amer: >60 mL/min (ref >60)
GFR calc non Af Amer: 59 mL/min — ABNORMAL LOW (ref >60)

## 2011-08-02 LAB — POCT CARDIAC MARKERS
CKMB, poc: 2.4 ng/mL (ref 1.0–8.0)
Myoglobin, poc: 197 ng/mL (ref 12–200)
Troponin i, poc: 0.05 ng/mL (ref 0.00–0.09)

## 2011-08-02 LAB — GLUCOSE, CAPILLARY: Glucose-Capillary: 75 mg/dL (ref 70–99)

## 2011-08-06 LAB — COMPREHENSIVE METABOLIC PANEL
AST: 35
Albumin: 3 — ABNORMAL LOW
Albumin: 3.4 — ABNORMAL LOW
Alkaline Phosphatase: 81
BUN: 22
Calcium: 8.8
Calcium: 9.1
Creatinine, Ser: 1.08
GFR calc Af Amer: >60
Glucose, Bld: 185 — ABNORMAL HIGH
Potassium: 3.5
Total Protein: 6.1
Total Protein: 6.7

## 2011-08-06 LAB — CBC
HCT: 35.3 — ABNORMAL LOW
HCT: 36.6 — ABNORMAL LOW
HCT: 37.8 — ABNORMAL LOW
Hemoglobin: 11.4 — ABNORMAL LOW
Hemoglobin: 11.7 — ABNORMAL LOW
Hemoglobin: 12.1 — ABNORMAL LOW
Hemoglobin: 12.2 — ABNORMAL LOW
Hemoglobin: 12.4 — ABNORMAL LOW
Hemoglobin: 12.6 — ABNORMAL LOW
MCHC: 33.1
MCHC: 33.2
MCV: 85.2
MCV: 85.9
Platelets: 168
Platelets: 176
RBC: 4.36
RDW: 17.1 — ABNORMAL HIGH
RDW: 17.4 — ABNORMAL HIGH
RDW: 17.4 — ABNORMAL HIGH
RDW: 17.8 — ABNORMAL HIGH
RDW: 18.1 — ABNORMAL HIGH
WBC: 7.1
WBC: 7.1

## 2011-08-06 LAB — LIPID PANEL
Cholesterol: 188
HDL: 39 — ABNORMAL LOW
Total CHOL/HDL Ratio: 4.8

## 2011-08-06 LAB — CK TOTAL AND CKMB (NOT AT ARMC)
CK, MB: 9.5 — ABNORMAL HIGH
Relative Index: 1.8
Total CK: 517 — ABNORMAL HIGH

## 2011-08-06 LAB — CARDIAC PANEL(CRET KIN+CKTOT+MB+TROPI)
CK, MB: 5.4 — ABNORMAL HIGH
Relative Index: 1.3
Total CK: 379 — ABNORMAL HIGH
Total CK: 409 — ABNORMAL HIGH
Troponin I: 0.05
Troponin I: 0.05

## 2011-08-06 LAB — BASIC METABOLIC PANEL
BUN: 14
BUN: 18
CO2: 32
Calcium: 9.2
Calcium: 9.4
Chloride: 95 — ABNORMAL LOW
Creatinine, Ser: 1.04
GFR calc Af Amer: >60
GFR calc non Af Amer: >60
GFR calc non Af Amer: >60
Glucose, Bld: 169 — ABNORMAL HIGH
Glucose, Bld: 177 — ABNORMAL HIGH
Glucose, Bld: 213 — ABNORMAL HIGH
Potassium: 3.5
Potassium: 4
Sodium: 130 — ABNORMAL LOW
Sodium: 134 — ABNORMAL LOW
Sodium: 135
Sodium: 136

## 2011-08-06 LAB — DIFFERENTIAL
Basophils Relative: 1
Lymphocytes Relative: 36
Lymphs Abs: 2.7
Monocytes Absolute: 0.7
Monocytes Relative: 10
Neutro Abs: 3.9
Neutrophils Relative %: 51

## 2011-08-06 LAB — URINALYSIS, ROUTINE W REFLEX MICROSCOPIC
Glucose, UA: NEGATIVE
Leukocytes, UA: NEGATIVE
Nitrite: NEGATIVE
pH: 6.5

## 2011-08-06 LAB — RAPID URINE DRUG SCREEN, HOSP PERFORMED
Amphetamines: NOT DETECTED
Benzodiazepines: NOT DETECTED
Opiates: NOT DETECTED
Tetrahydrocannabinol: NOT DETECTED

## 2011-08-06 LAB — I-STAT 8, (EC8 V) (CONVERTED LAB)
BUN: 27 — ABNORMAL HIGH
Bicarbonate: 35.9 — ABNORMAL HIGH
Glucose, Bld: 60 — ABNORMAL LOW
Operator id: 285491
pCO2, Ven: 68.3 — ABNORMAL HIGH
pH, Ven: 7.328 — ABNORMAL HIGH

## 2011-08-06 LAB — TSH: TSH: 22.108 — ABNORMAL HIGH

## 2011-08-06 LAB — TROPONIN I: Troponin I: 0.05

## 2011-08-06 LAB — POCT CARDIAC MARKERS
Myoglobin, poc: >500
Operator id: 196461

## 2011-08-06 LAB — URINE MICROSCOPIC-ADD ON

## 2011-08-09 LAB — URINE CULTURE

## 2011-08-09 LAB — URINALYSIS, ROUTINE W REFLEX MICROSCOPIC
Bilirubin Urine: NEGATIVE
Glucose, UA: NEGATIVE
Ketones, ur: NEGATIVE
Nitrite: NEGATIVE
Specific Gravity, Urine: 1.019
pH: 6.5

## 2011-08-09 LAB — COMPREHENSIVE METABOLIC PANEL
Albumin: 2.8 — ABNORMAL LOW
Alkaline Phosphatase: 90
BUN: 23
Calcium: 9.7
Potassium: 4.2
Total Protein: 6.3

## 2011-08-09 LAB — URINE MICROSCOPIC-ADD ON

## 2011-08-09 LAB — BASIC METABOLIC PANEL
BUN: 20
CO2: 31
Chloride: 97
Glucose, Bld: 156 — ABNORMAL HIGH
Potassium: 5
Sodium: 134 — ABNORMAL LOW

## 2011-08-09 LAB — DIFFERENTIAL
Basophils Relative: 1
Lymphocytes Relative: 43
Lymphs Abs: 3.4 — ABNORMAL HIGH
Monocytes Absolute: 0.8 — ABNORMAL HIGH
Monocytes Relative: 10
Neutro Abs: 3.1
Neutrophils Relative %: 40 — ABNORMAL LOW

## 2011-08-09 LAB — CBC
HCT: 34.4 — ABNORMAL LOW
MCHC: 33.3
Platelets: 256
RDW: 17 — ABNORMAL HIGH

## 2011-08-12 LAB — BASIC METABOLIC PANEL
CO2: 26
CO2: 32
CO2: 33 — ABNORMAL HIGH
CO2: 34 — ABNORMAL HIGH
Calcium: 8.6
Calcium: 8.8
Calcium: 9.5
Calcium: 9.5
Calcium: 9.5
Chloride: 94 — ABNORMAL LOW
Chloride: 94 — ABNORMAL LOW
Creatinine, Ser: 1.1
Creatinine, Ser: 1.31
Creatinine, Ser: 1.34
Creatinine, Ser: 1.39
GFR calc Af Amer: 53 — ABNORMAL LOW
GFR calc Af Amer: >60
GFR calc Af Amer: >60
GFR calc Af Amer: >60
GFR calc non Af Amer: 44 — ABNORMAL LOW
GFR calc non Af Amer: 52 — ABNORMAL LOW
GFR calc non Af Amer: 54 — ABNORMAL LOW
GFR calc non Af Amer: 56 — ABNORMAL LOW
Glucose, Bld: 168 — ABNORMAL HIGH
Glucose, Bld: 206 — ABNORMAL HIGH
Glucose, Bld: 241 — ABNORMAL HIGH
Glucose, Bld: 321 — ABNORMAL HIGH
Potassium: 4.2
Potassium: 4.4
Potassium: 4.4
Sodium: 127 — ABNORMAL LOW
Sodium: 129 — ABNORMAL LOW
Sodium: 131 — ABNORMAL LOW
Sodium: 132 — ABNORMAL LOW
Sodium: 133 — ABNORMAL LOW

## 2011-08-12 LAB — URINALYSIS, ROUTINE W REFLEX MICROSCOPIC
Glucose, UA: NEGATIVE
Specific Gravity, Urine: 1.02

## 2011-08-12 LAB — COMPREHENSIVE METABOLIC PANEL
ALT: 16
Alkaline Phosphatase: 99
BUN: 17
CO2: 29
GFR calc non Af Amer: 49 — ABNORMAL LOW
Glucose, Bld: 195 — ABNORMAL HIGH
Potassium: 3.7
Sodium: 128 — ABNORMAL LOW

## 2011-08-12 LAB — CBC
HCT: 33.7 — ABNORMAL LOW
HCT: 36.8 — ABNORMAL LOW
Hemoglobin: 10.6 — ABNORMAL LOW
Hemoglobin: 11.1 — ABNORMAL LOW
Hemoglobin: 11.2 — ABNORMAL LOW
Hemoglobin: 12 — ABNORMAL LOW
Hemoglobin: 12.5 — ABNORMAL LOW
MCHC: 32.8
MCHC: 32.9
MCHC: 33.3
MCHC: 34
MCHC: 34.1
MCV: 82.7
MCV: 83.8
Platelets: 139 — ABNORMAL LOW
Platelets: 220
RBC: 3.78 — ABNORMAL LOW
RBC: 3.98 — ABNORMAL LOW
RBC: 3.99 — ABNORMAL LOW
RBC: 4.44
RDW: 16.3 — ABNORMAL HIGH
RDW: 16.4 — ABNORMAL HIGH
RDW: 16.5 — ABNORMAL HIGH
RDW: 16.6 — ABNORMAL HIGH
WBC: 7.6
WBC: 7.6

## 2011-08-12 LAB — PROTIME-INR
INR: 1.1
Prothrombin Time: 14.7

## 2011-08-12 LAB — POCT CARDIAC MARKERS: Operator id: 294501

## 2011-08-12 LAB — DIFFERENTIAL
Basophils Absolute: 0.1
Basophils Relative: 0
Eosinophils Absolute: 0
Neutro Abs: 15.4 — ABNORMAL HIGH
Neutrophils Relative %: 82 — ABNORMAL HIGH

## 2011-08-12 LAB — URINE CULTURE: Colony Count: >=100000

## 2011-08-12 LAB — CULTURE, BLOOD (ROUTINE X 2): Culture: NO GROWTH

## 2011-08-12 LAB — URINE MICROSCOPIC-ADD ON

## 2011-08-12 LAB — OCCULT BLOOD X 1 CARD TO LAB, STOOL: Fecal Occult Bld: POSITIVE

## 2011-08-12 LAB — GLUCOSE, RANDOM: Glucose, Bld: 334 — ABNORMAL HIGH

## 2011-08-12 LAB — GRAM STAIN

## 2015-04-02 ENCOUNTER — Inpatient Hospital Stay (HOSPITAL_COMMUNITY)
Admission: EM | Admit: 2015-04-02 | Discharge: 2015-04-08 | DRG: 442 | Disposition: A | Discharge status: Skilled Nursing Facility | Payer: Medicare Other | Attending: Internal Medicine | Admitting: Internal Medicine

## 2015-04-02 ENCOUNTER — Emergency Department (HOSPITAL_COMMUNITY): Payer: Medicare Other

## 2015-04-02 ENCOUNTER — Encounter (HOSPITAL_COMMUNITY): Payer: Self-pay | Admitting: Emergency Medicine

## 2015-04-02 DIAGNOSIS — Z7901 Long term (current) use of anticoagulants: Secondary | ICD-10-CM

## 2015-04-02 DIAGNOSIS — E1165 Type 2 diabetes mellitus with hyperglycemia: Secondary | ICD-10-CM | POA: Diagnosis present

## 2015-04-02 DIAGNOSIS — K8020 Calculus of gallbladder without cholecystitis without obstruction: Secondary | ICD-10-CM

## 2015-04-02 DIAGNOSIS — I10 Essential (primary) hypertension: Secondary | ICD-10-CM | POA: Diagnosis present

## 2015-04-02 DIAGNOSIS — Z950 Presence of cardiac pacemaker: Secondary | ICD-10-CM

## 2015-04-02 DIAGNOSIS — R945 Abnormal results of liver function studies: Secondary | ICD-10-CM

## 2015-04-02 DIAGNOSIS — R531 Weakness: Secondary | ICD-10-CM

## 2015-04-02 DIAGNOSIS — K754 Autoimmune hepatitis: Secondary | ICD-10-CM | POA: Diagnosis not present

## 2015-04-02 DIAGNOSIS — R197 Diarrhea, unspecified: Secondary | ICD-10-CM | POA: Diagnosis not present

## 2015-04-02 DIAGNOSIS — IMO0002 Reserved for concepts with insufficient information to code with codable children: Secondary | ICD-10-CM | POA: Diagnosis present

## 2015-04-02 DIAGNOSIS — I129 Hypertensive chronic kidney disease with stage 1 through stage 4 chronic kidney disease, or unspecified chronic kidney disease: Secondary | ICD-10-CM | POA: Diagnosis present

## 2015-04-02 DIAGNOSIS — N183 Chronic kidney disease, stage 3 unspecified: Secondary | ICD-10-CM | POA: Diagnosis present

## 2015-04-02 DIAGNOSIS — R748 Abnormal levels of other serum enzymes: Secondary | ICD-10-CM | POA: Diagnosis present

## 2015-04-02 DIAGNOSIS — Z79899 Other long term (current) drug therapy: Secondary | ICD-10-CM

## 2015-04-02 DIAGNOSIS — E039 Hypothyroidism, unspecified: Secondary | ICD-10-CM | POA: Diagnosis present

## 2015-04-02 DIAGNOSIS — K449 Diaphragmatic hernia without obstruction or gangrene: Secondary | ICD-10-CM | POA: Diagnosis present

## 2015-04-02 DIAGNOSIS — K759 Inflammatory liver disease, unspecified: Secondary | ICD-10-CM

## 2015-04-02 DIAGNOSIS — Z86718 Personal history of other venous thrombosis and embolism: Secondary | ICD-10-CM

## 2015-04-02 DIAGNOSIS — K2960 Other gastritis without bleeding: Secondary | ICD-10-CM | POA: Insufficient documentation

## 2015-04-02 DIAGNOSIS — R7989 Other specified abnormal findings of blood chemistry: Secondary | ICD-10-CM | POA: Diagnosis present

## 2015-04-02 DIAGNOSIS — Z8673 Personal history of transient ischemic attack (TIA), and cerebral infarction without residual deficits: Secondary | ICD-10-CM

## 2015-04-02 DIAGNOSIS — A047 Enterocolitis due to Clostridium difficile: Secondary | ICD-10-CM | POA: Diagnosis present

## 2015-04-02 DIAGNOSIS — J449 Chronic obstructive pulmonary disease, unspecified: Secondary | ICD-10-CM | POA: Diagnosis present

## 2015-04-02 DIAGNOSIS — Z7401 Bed confinement status: Secondary | ICD-10-CM

## 2015-04-02 DIAGNOSIS — K2980 Duodenitis without bleeding: Secondary | ICD-10-CM | POA: Insufficient documentation

## 2015-04-02 DIAGNOSIS — Z833 Family history of diabetes mellitus: Secondary | ICD-10-CM

## 2015-04-02 DIAGNOSIS — Z8249 Family history of ischemic heart disease and other diseases of the circulatory system: Secondary | ICD-10-CM

## 2015-04-02 DIAGNOSIS — E876 Hypokalemia: Secondary | ICD-10-CM | POA: Diagnosis present

## 2015-04-02 DIAGNOSIS — Z7982 Long term (current) use of aspirin: Secondary | ICD-10-CM

## 2015-04-02 HISTORY — DX: Type 2 diabetes mellitus without complications: E11.9

## 2015-04-02 HISTORY — DX: Cerebral infarction, unspecified: I63.9

## 2015-04-02 HISTORY — DX: Hypothyroidism, unspecified: E03.9

## 2015-04-02 HISTORY — DX: Presence of cardiac pacemaker: Z95.0

## 2015-04-02 HISTORY — DX: Essential (primary) hypertension: I10

## 2015-04-02 LAB — COMPREHENSIVE METABOLIC PANEL
ALBUMIN: 2.6 g/dL — AB (ref 3.5–5.0)
ALT: 560 U/L — ABNORMAL HIGH (ref 17–63)
AST: 780 U/L — ABNORMAL HIGH (ref 15–41)
Alkaline Phosphatase: 651 U/L — ABNORMAL HIGH (ref 38–126)
Anion gap: 7 (ref 5–15)
BUN: 22 mg/dL — ABNORMAL HIGH (ref 6–20)
CHLORIDE: 100 mmol/L — AB (ref 101–111)
CO2: 29 mmol/L (ref 22–32)
Calcium: 8.9 mg/dL (ref 8.9–10.3)
Creatinine, Ser: 1.49 mg/dL — ABNORMAL HIGH (ref 0.61–1.24)
GFR calc Af Amer: 53 mL/min — ABNORMAL LOW (ref >60)
GFR, EST NON AFRICAN AMERICAN: 45 mL/min — AB (ref >60)
GLUCOSE: 140 mg/dL — AB (ref 65–99)
POTASSIUM: 3.4 mmol/L — AB (ref 3.5–5.1)
Sodium: 136 mmol/L (ref 135–145)
Total Bilirubin: 0.9 mg/dL (ref 0.3–1.2)
Total Protein: 7.1 g/dL (ref 6.5–8.1)

## 2015-04-02 LAB — CBC WITH DIFFERENTIAL/PLATELET
BASOS ABS: 0 10*3/uL (ref 0.0–0.1)
Basophils Relative: 1 % (ref 0–1)
Eosinophils Absolute: 0.2 10*3/uL (ref 0.0–0.7)
Eosinophils Relative: 4 % (ref 0–5)
HCT: 41.1 % (ref 39.0–52.0)
Hemoglobin: 13.7 g/dL (ref 13.0–17.0)
LYMPHS ABS: 1.9 10*3/uL (ref 0.7–4.0)
Lymphocytes Relative: 33 % (ref 12–46)
MCH: 28.7 pg (ref 26.0–34.0)
MCHC: 33.3 g/dL (ref 30.0–36.0)
MCV: 86 fL (ref 78.0–100.0)
MONO ABS: 1 10*3/uL (ref 0.1–1.0)
MONOS PCT: 18 % — AB (ref 3–12)
NEUTROS ABS: 2.6 10*3/uL (ref 1.7–7.7)
Neutrophils Relative %: 45 % (ref 43–77)
PLATELETS: 166 10*3/uL (ref 150–400)
RBC: 4.78 MIL/uL (ref 4.22–5.81)
RDW: 17.2 % — ABNORMAL HIGH (ref 11.5–15.5)
WBC: 5.7 10*3/uL (ref 4.0–10.5)

## 2015-04-02 LAB — LIPASE, BLOOD: Lipase: 15 U/L — ABNORMAL LOW (ref 22–51)

## 2015-04-02 MED ORDER — SODIUM CHLORIDE 0.9 % IV BOLUS (SEPSIS)
1000.0000 mL | Freq: Once | INTRAVENOUS | Status: AC
  Administered 2015-04-02: 1000 mL via INTRAVENOUS

## 2015-04-02 MED ORDER — IOHEXOL 300 MG/ML  SOLN
25.0000 mL | Freq: Once | INTRAMUSCULAR | Status: AC | PRN
  Administered 2015-04-02: 25 mL via ORAL

## 2015-04-02 NOTE — ED Notes (Signed)
Bed: WA08 Expected date:  Expected time:  Means of arrival:  Comments: EMS 68M diarrhea c diff

## 2015-04-02 NOTE — ED Notes (Signed)
Pt via EMS states that he was diagnosed with CDiff about 2 months ago and has been [facility]. He has been on flagyl and on Vancomycin, but is still having symptoms. He also reports being weak and "not being able to walk" for about a week.

## 2015-04-02 NOTE — ED Provider Notes (Signed)
CSN: [id number]     Arrival date & time 04/02/15  2100 History   First MD Initiated Contact with Patient 04/02/15 2109     Chief Complaint  Patient presents with  . Diarrhea     (Consider location/radiation/quality/duration/timing/severity/associated sxs/prior Treatment) HPI Comments: Patient is a 69 year old male with history of diabetes, hypertension, and CVA. He was recently hospitalized after being diagnosed with C. difficile colitis approximately one month ago at the [facility] in [place]. His wife reports that he has had continuous diarrhea for the past month. He is now weak to the point where he is having difficulty walking and getting around. The patient denies to me he is having any abdominal pain, fever, or bloody stool.  He has no local physician and is followed by the [facility] in [place].  Patient is a 69 y.o. male presenting with diarrhea. The history is provided by the patient.  Diarrhea Quality:  Mucous Severity:  Moderate Onset quality:  Gradual Duration:  1 month Timing:  Constant Progression:  Worsening Relieved by:  Nothing Worsened by:  Nothing tried Ineffective treatments:  None tried Associated symptoms: no abdominal pain and no fever     Past Medical History  Diagnosis Date  . Diabetes mellitus without complication   . Hypertension    History reviewed. No pertinent past surgical history. No family history on file. History  Substance Use Topics  . Smoking status: Never Smoker   . Smokeless tobacco: Not on file  . Alcohol Use: No    Review of Systems  Constitutional: Negative for fever.  Gastrointestinal: Positive for diarrhea. Negative for abdominal pain.  All other systems reviewed and are negative.     Allergies  Review of patient's allergies indicates not on file.  Home Medications   Prior to Admission medications   Not on File   BP 155/85 mmHg  Pulse 62  Temp(Src) 97.7 F (36.5 C) (Oral)  Resp 16  Ht  (1.905 m)  Wt 260 lb  (117.935 kg)  BMI 32.50 kg/m2  SpO2 95% Physical Exam  Constitutional: He is oriented to person, place, and time. He appears well-developed and well-nourished. No distress.  HENT:  Head: Normocephalic and atraumatic.  Neck: Normal range of motion. Neck supple.  Cardiovascular: Normal rate, regular rhythm and normal heart sounds.   No murmur heard. Pulmonary/Chest: Effort normal and breath sounds normal. No respiratory distress. He has no wheezes.  Abdominal: Soft. Bowel sounds are normal. He exhibits no distension. There is no tenderness.  Musculoskeletal: Normal range of motion. He exhibits no edema.  Lymphadenopathy:    He has no cervical adenopathy.  Neurological: He is alert and oriented to person, place, and time.  Skin: Skin is warm and dry. He is not diaphoretic.  Nursing note and vitals reviewed.   ED Course  Procedures (including critical care time) Labs Review Labs Reviewed  CLOSTRIDIUM DIFFICILE BY PCR (NOT AT [facility])  COMPREHENSIVE METABOLIC PANEL  LIPASE, BLOOD  CBC WITH DIFFERENTIAL/PLATELET    Imaging Review No results found.   EKG Interpretation None      MDM   Final diagnoses:  None    His workup reveals significant elevations of his LFTs, however his blood counts and electrolytes are otherwise unremarkable. His lipase is normal. I have discussed the liver functions with Dr. Juanda Chance from gastroenterology who is recommending a CT scan to further delineate the cause of these LFT elevations. An acute hepatitis panel was also added on and at this point  is pending.  To be admitted to the Hospitalist service.    Geoffery Lyonsouglas Mekiah Cambridge, MD 04/05/15 [phone]

## 2015-04-03 ENCOUNTER — Emergency Department (HOSPITAL_COMMUNITY): Payer: Medicare Other

## 2015-04-03 ENCOUNTER — Encounter (HOSPITAL_COMMUNITY): Payer: Self-pay

## 2015-04-03 DIAGNOSIS — I10 Essential (primary) hypertension: Secondary | ICD-10-CM | POA: Diagnosis present

## 2015-04-03 DIAGNOSIS — Z833 Family history of diabetes mellitus: Secondary | ICD-10-CM | POA: Diagnosis not present

## 2015-04-03 DIAGNOSIS — E039 Hypothyroidism, unspecified: Secondary | ICD-10-CM | POA: Diagnosis present

## 2015-04-03 DIAGNOSIS — N183 Chronic kidney disease, stage 3 unspecified: Secondary | ICD-10-CM | POA: Diagnosis present

## 2015-04-03 DIAGNOSIS — E1165 Type 2 diabetes mellitus with hyperglycemia: Secondary | ICD-10-CM

## 2015-04-03 DIAGNOSIS — Z86718 Personal history of other venous thrombosis and embolism: Secondary | ICD-10-CM | POA: Diagnosis not present

## 2015-04-03 DIAGNOSIS — Z7901 Long term (current) use of anticoagulants: Secondary | ICD-10-CM | POA: Diagnosis not present

## 2015-04-03 DIAGNOSIS — IMO0002 Reserved for concepts with insufficient information to code with codable children: Secondary | ICD-10-CM | POA: Diagnosis present

## 2015-04-03 DIAGNOSIS — E876 Hypokalemia: Secondary | ICD-10-CM | POA: Diagnosis present

## 2015-04-03 DIAGNOSIS — Z7401 Bed confinement status: Secondary | ICD-10-CM | POA: Diagnosis not present

## 2015-04-03 DIAGNOSIS — R55 Syncope and collapse: Secondary | ICD-10-CM | POA: Diagnosis not present

## 2015-04-03 DIAGNOSIS — I129 Hypertensive chronic kidney disease with stage 1 through stage 4 chronic kidney disease, or unspecified chronic kidney disease: Secondary | ICD-10-CM | POA: Diagnosis present

## 2015-04-03 DIAGNOSIS — Z8673 Personal history of transient ischemic attack (TIA), and cerebral infarction without residual deficits: Secondary | ICD-10-CM | POA: Diagnosis not present

## 2015-04-03 DIAGNOSIS — R7989 Other specified abnormal findings of blood chemistry: Secondary | ICD-10-CM | POA: Diagnosis present

## 2015-04-03 DIAGNOSIS — K2900 Acute gastritis without bleeding: Secondary | ICD-10-CM | POA: Diagnosis not present

## 2015-04-03 DIAGNOSIS — R197 Diarrhea, unspecified: Secondary | ICD-10-CM

## 2015-04-03 DIAGNOSIS — Z79899 Other long term (current) drug therapy: Secondary | ICD-10-CM | POA: Diagnosis not present

## 2015-04-03 DIAGNOSIS — R748 Abnormal levels of other serum enzymes: Secondary | ICD-10-CM | POA: Diagnosis present

## 2015-04-03 DIAGNOSIS — K759 Inflammatory liver disease, unspecified: Secondary | ICD-10-CM | POA: Diagnosis not present

## 2015-04-03 DIAGNOSIS — K2960 Other gastritis without bleeding: Secondary | ICD-10-CM | POA: Diagnosis present

## 2015-04-03 DIAGNOSIS — A047 Enterocolitis due to Clostridium difficile: Secondary | ICD-10-CM | POA: Diagnosis present

## 2015-04-03 DIAGNOSIS — J449 Chronic obstructive pulmonary disease, unspecified: Secondary | ICD-10-CM | POA: Diagnosis present

## 2015-04-03 DIAGNOSIS — K7469 Other cirrhosis of liver: Secondary | ICD-10-CM | POA: Diagnosis not present

## 2015-04-03 DIAGNOSIS — K2980 Duodenitis without bleeding: Secondary | ICD-10-CM | POA: Diagnosis not present

## 2015-04-03 DIAGNOSIS — R531 Weakness: Secondary | ICD-10-CM

## 2015-04-03 DIAGNOSIS — K8020 Calculus of gallbladder without cholecystitis without obstruction: Secondary | ICD-10-CM

## 2015-04-03 DIAGNOSIS — K449 Diaphragmatic hernia without obstruction or gangrene: Secondary | ICD-10-CM | POA: Diagnosis present

## 2015-04-03 DIAGNOSIS — Z950 Presence of cardiac pacemaker: Secondary | ICD-10-CM | POA: Diagnosis not present

## 2015-04-03 DIAGNOSIS — R945 Abnormal results of liver function studies: Secondary | ICD-10-CM

## 2015-04-03 DIAGNOSIS — K754 Autoimmune hepatitis: Secondary | ICD-10-CM | POA: Diagnosis present

## 2015-04-03 DIAGNOSIS — Z8249 Family history of ischemic heart disease and other diseases of the circulatory system: Secondary | ICD-10-CM | POA: Diagnosis not present

## 2015-04-03 DIAGNOSIS — Z7982 Long term (current) use of aspirin: Secondary | ICD-10-CM | POA: Diagnosis not present

## 2015-04-03 LAB — CBC WITH DIFFERENTIAL/PLATELET
BASOS ABS: 0 10*3/uL (ref 0.0–0.1)
BASOS PCT: 1 % (ref 0–1)
EOS ABS: 0.2 10*3/uL (ref 0.0–0.7)
EOS PCT: 3 % (ref 0–5)
HCT: 40.1 % (ref 39.0–52.0)
Hemoglobin: 13.1 g/dL (ref 13.0–17.0)
LYMPHS PCT: 36 % (ref 12–46)
Lymphs Abs: 2.4 10*3/uL (ref 0.7–4.0)
MCH: 27.9 pg (ref 26.0–34.0)
MCHC: 32.7 g/dL (ref 30.0–36.0)
MCV: 85.5 fL (ref 78.0–100.0)
MONO ABS: 1.2 10*3/uL — AB (ref 0.1–1.0)
Monocytes Relative: 18 % — ABNORMAL HIGH (ref 3–12)
NEUTROS ABS: 2.8 10*3/uL (ref 1.7–7.7)
NEUTROS PCT: 42 % — AB (ref 43–77)
Platelets: 167 10*3/uL (ref 150–400)
RBC: 4.69 MIL/uL (ref 4.22–5.81)
RDW: 17.3 % — AB (ref 11.5–15.5)
WBC: 6.6 10*3/uL (ref 4.0–10.5)

## 2015-04-03 LAB — HEPATIC FUNCTION PANEL
ALBUMIN: 2.5 g/dL — AB (ref 3.5–5.0)
ALK PHOS: 601 U/L — AB (ref 38–126)
ALT: 547 U/L — ABNORMAL HIGH (ref 17–63)
AST: 795 U/L — ABNORMAL HIGH (ref 15–41)
BILIRUBIN DIRECT: 0.6 mg/dL — AB (ref 0.1–0.5)
BILIRUBIN TOTAL: 1.1 mg/dL (ref 0.3–1.2)
Indirect Bilirubin: 0.5 mg/dL (ref 0.3–0.9)
Total Protein: 6.9 g/dL (ref 6.5–8.1)

## 2015-04-03 LAB — BASIC METABOLIC PANEL
ANION GAP: 9 (ref 5–15)
BUN: 22 mg/dL — ABNORMAL HIGH (ref 6–20)
CHLORIDE: 101 mmol/L (ref 101–111)
CO2: 28 mmol/L (ref 22–32)
Calcium: 8.8 mg/dL — ABNORMAL LOW (ref 8.9–10.3)
Creatinine, Ser: 1.33 mg/dL — ABNORMAL HIGH (ref 0.61–1.24)
GFR calc non Af Amer: 52 mL/min — ABNORMAL LOW (ref >60)
Glucose, Bld: 120 mg/dL — ABNORMAL HIGH (ref 65–99)
POTASSIUM: 3 mmol/L — AB (ref 3.5–5.1)
Sodium: 138 mmol/L (ref 135–145)

## 2015-04-03 LAB — PROTIME-INR
INR: 3.09 — ABNORMAL HIGH (ref 0.00–1.49)
INR: 3.31 — ABNORMAL HIGH (ref 0.00–1.49)
Prothrombin Time: 31.3 seconds — ABNORMAL HIGH (ref 11.6–15.2)
Prothrombin Time: 33 seconds — ABNORMAL HIGH (ref 11.6–15.2)

## 2015-04-03 LAB — GLUCOSE, CAPILLARY
GLUCOSE-CAPILLARY: 116 mg/dL — AB (ref 65–99)
GLUCOSE-CAPILLARY: 136 mg/dL — AB (ref 65–99)
GLUCOSE-CAPILLARY: 218 mg/dL — AB (ref 65–99)
Glucose-Capillary: 55 mg/dL — ABNORMAL LOW (ref 65–99)
Glucose-Capillary: 97 mg/dL (ref 65–99)
Glucose-Capillary: 198 mg/dL — ABNORMAL HIGH (ref 65–99)

## 2015-04-03 LAB — ABO/RH: ABO/RH(D): O POS

## 2015-04-03 LAB — TYPE AND SCREEN
ABO/RH(D): O POS
Antibody Screen: NEGATIVE

## 2015-04-03 LAB — TROPONIN I: Troponin I: 0.41 ng/mL — ABNORMAL HIGH (ref <0.031)

## 2015-04-03 LAB — APTT: aPTT: 75 seconds — ABNORMAL HIGH (ref 24–37)

## 2015-04-03 LAB — ACETAMINOPHEN LEVEL: Acetaminophen (Tylenol), Serum: <10 ug/mL — ABNORMAL LOW (ref 10–30)

## 2015-04-03 LAB — CLOSTRIDIUM DIFFICILE BY PCR: Toxigenic C. Difficile by PCR: POSITIVE — AB

## 2015-04-03 LAB — CK: Total CK: 45 U/L — ABNORMAL LOW (ref 49–397)

## 2015-04-03 MED ORDER — IOHEXOL 300 MG/ML  SOLN
100.0000 mL | Freq: Once | INTRAMUSCULAR | Status: AC | PRN
  Administered 2015-04-03: 100 mL via INTRAVENOUS

## 2015-04-03 MED ORDER — ONDANSETRON HCL 4 MG PO TABS: 4.0000 mg | ORAL_TABLET | Freq: Four times a day (QID) | ORAL | Status: DC | PRN

## 2015-04-03 MED ORDER — SERTRALINE HCL 100 MG PO TABS
200.0000 mg | ORAL_TABLET | Freq: Every day | ORAL | Status: DC
  Administered 2015-04-03 – 2015-04-08 (×6): 200 mg via ORAL
  Filled 2015-04-03 (×6): qty 2

## 2015-04-03 MED ORDER — METOPROLOL SUCCINATE ER 25 MG PO TB24
25.0000 mg | ORAL_TABLET | Freq: Every day | ORAL | Status: DC
  Administered 2015-04-03 – 2015-04-08 (×6): 25 mg via ORAL
  Filled 2015-04-03 (×6): qty 1

## 2015-04-03 MED ORDER — ASPIRIN 81 MG PO CHEW
81.0000 mg | CHEWABLE_TABLET | Freq: Every day | ORAL | Status: DC
  Administered 2015-04-03 – 2015-04-07 (×5): 81 mg via ORAL
  Filled 2015-04-03 (×5): qty 1

## 2015-04-03 MED ORDER — ONDANSETRON HCL 4 MG/2ML IJ SOLN: 4.0000 mg | Freq: Four times a day (QID) | INTRAMUSCULAR | Status: DC | PRN

## 2015-04-03 MED ORDER — POTASSIUM CHLORIDE CRYS ER 20 MEQ PO TBCR
40.0000 meq | EXTENDED_RELEASE_TABLET | Freq: Two times a day (BID) | ORAL | Status: AC
  Administered 2015-04-03 – 2015-04-04 (×4): 40 meq via ORAL
  Filled 2015-04-03 (×3): qty 2

## 2015-04-03 MED ORDER — BUDESONIDE-FORMOTEROL FUMARATE 160-4.5 MCG/ACT IN AERO
2.0000 | INHALATION_SPRAY | Freq: Two times a day (BID) | RESPIRATORY_TRACT | Status: DC
  Administered 2015-04-03 – 2015-04-08 (×10): 2 via RESPIRATORY_TRACT
  Filled 2015-04-03: qty 6

## 2015-04-03 MED ORDER — INSULIN GLARGINE 100 UNIT/ML ~~LOC~~ SOLN
10.0000 [IU] | Freq: Every day | SUBCUTANEOUS | Status: DC
  Administered 2015-04-03 – 2015-04-07 (×5): 10 [IU] via SUBCUTANEOUS
  Filled 2015-04-03 (×5): qty 0.1

## 2015-04-03 MED ORDER — HYPROMELLOSE (GONIOSCOPIC) 2.5 % OP SOLN: 1.0000 [drp] | Freq: Four times a day (QID) | OPHTHALMIC | Status: DC | PRN

## 2015-04-03 MED ORDER — LEVOTHYROXINE SODIUM 200 MCG PO TABS
200.0000 ug | ORAL_TABLET | Freq: Every day | ORAL | Status: DC
  Administered 2015-04-03 – 2015-04-08 (×6): 200 ug via ORAL
  Filled 2015-04-03 (×9): qty 1

## 2015-04-03 MED ORDER — SACCHAROMYCES BOULARDII 250 MG PO CAPS
250.0000 mg | ORAL_CAPSULE | Freq: Two times a day (BID) | ORAL | Status: DC
  Administered 2015-04-03 – 2015-04-08 (×11): 250 mg via ORAL
  Filled 2015-04-03 (×12): qty 1

## 2015-04-03 MED ORDER — INSULIN ASPART 100 UNIT/ML ~~LOC~~ SOLN
0.0000 [IU] | Freq: Three times a day (TID) | SUBCUTANEOUS | Status: DC
  Administered 2015-04-03: 1 [IU] via SUBCUTANEOUS
  Administered 2015-04-03: 2 [IU] via SUBCUTANEOUS
  Administered 2015-04-03: 3 [IU] via SUBCUTANEOUS
  Administered 2015-04-05: 1 [IU] via SUBCUTANEOUS
  Administered 2015-04-06 (×3): 2 [IU] via SUBCUTANEOUS
  Administered 2015-04-07: 1 [IU] via SUBCUTANEOUS
  Administered 2015-04-08: 2 [IU] via SUBCUTANEOUS
  Administered 2015-04-08: 3 [IU] via SUBCUTANEOUS

## 2015-04-03 MED ORDER — POTASSIUM CHLORIDE IN NACL 20-0.9 MEQ/L-% IV SOLN
INTRAVENOUS | Status: AC
  Administered 2015-04-03 (×2): via INTRAVENOUS
  Filled 2015-04-03 (×2): qty 1000

## 2015-04-03 MED ORDER — VANCOMYCIN 50 MG/ML ORAL SOLUTION
125.0000 mg | Freq: Four times a day (QID) | ORAL | Status: DC
  Administered 2015-04-03 – 2015-04-08 (×22): 125 mg via ORAL
  Filled 2015-04-03 (×31): qty 2.5

## 2015-04-03 MED ORDER — LORATADINE 10 MG PO TABS
10.0000 mg | ORAL_TABLET | Freq: Every day | ORAL | Status: DC
Start: 2015-04-03 — End: 2015-04-08
  Administered 2015-04-03 – 2015-04-08 (×6): 10 mg via ORAL
  Filled 2015-04-03 (×6): qty 1

## 2015-04-03 MED ORDER — POLYVINYL ALCOHOL 1.4 % OP SOLN
1.0000 [drp] | Freq: Every day | OPHTHALMIC | Status: DC | PRN
  Filled 2015-04-03: qty 15

## 2015-04-03 MED ORDER — BRIMONIDINE TARTRATE 0.2 % OP SOLN
1.0000 [drp] | Freq: Two times a day (BID) | OPHTHALMIC | Status: DC
  Administered 2015-04-03 – 2015-04-08 (×11): 1 [drp] via OPHTHALMIC
  Filled 2015-04-03: qty 5

## 2015-04-03 NOTE — Progress Notes (Signed)
Hypoglycemic Event  CBG: 55  Treatment: 15 GM carbohydrate snack  Symptoms: None  Follow-up CBG: Time:0547 CBG Result:97  Possible Reasons for Event: Inadequate meal intake  Comments/MD notified:Dr Kakrakandy at bedside at time blood sugar 55, patient given juice and crackers,  MD aware of recheck    Lanney GinsWilliams-James, Bethan Adamek Dawn  Remember to initiate Hypoglycemia Order Set & complete

## 2015-04-03 NOTE — Progress Notes (Signed)
ANTICOAGULATION CONSULT NOTE   Pharmacy Consult for Heparin Indication: Hx of DVT  No Known Allergies  Patient Measurements: Height: 6\' 3"  (190.5 cm) Weight: 260 lb (117.935 kg) IBW/kg (Calculated) : 84.5   Vital Signs: Temp: 97.4 F (36.3 C) (06/06 0458) BP: 156/90 mmHg (06/06 1029) Pulse Rate: 85 (06/06 1029)  Labs:  Recent Labs  04/02/15 2119 04/02/15 2312 04/03/15 0650 04/03/15 1240  HGB 13.7  --  13.1  --   HCT 41.1  --  40.1  --   PLT 166  --  167  --   APTT  --   --  75*  --   LABPROT  --  31.3*  --  33.0*  INR  --  3.09*  --  3.31*  CREATININE 1.49*  --  1.33*  --   CKTOTAL  --   --  45*  --   TROPONINI  --   --  0.41*  --     Estimated Creatinine Clearance: 70.5 mL/min (by C-G formula based on Cr of 1.33).   Medical History: Past Medical History  Diagnosis Date  . Diabetes mellitus without complication   . Hypertension   . Presence of permanent cardiac pacemaker   . Hypothyroidism   . Stroke     Medications:  Scheduled:  . aspirin  81 mg Oral Daily  . brimonidine  1 drop Left Eye BID  . budesonide-formoterol  2 puff Inhalation BID  . insulin aspart  0-9 Units Subcutaneous TID WC  . insulin glargine  10 Units Subcutaneous QHS  . levothyroxine  200 mcg Oral QAC breakfast  . loratadine  10 mg Oral Daily  . metoprolol succinate  25 mg Oral Daily  . potassium chloride  40 mEq Oral BID  . saccharomyces boulardii  250 mg Oral BID  . sertraline  200 mg Oral Daily  . vancomycin  125 mg Oral 4 times per day   Infusions:  . 0.9 % NaCl with KCl 20 mEq / L 75 mL/hr at 04/03/15 0630   PRN: ondansetron **OR** ondansetron (ZOFRAN) IV, polyvinyl alcohol  Assessment: 69 y/o M with PMH that includes but is not limited to stroke, DVT, and CDAD, on warfarin PTA with dosage reported as 5 mg PO daily except 7.5 mg Mondays and Wednesdays (last taken 6/5), admitted through ED 04/03/15 with generalized weakness in setting of severe persistent diarrhea, markedly  elevated hepatic transaminases, and CT abd suggestive of mild colitis.  GI consult is pending.  Warfarin was held for now and pharmacy consult was requested for IV heparin.  INR supratherapeutic (3.09) on admission. No documented suspicion of any acute thromboembolic process.      This afternoon - 04/03/2015 INR remains supratherapeutic and is rising   Goal Range when heparin starts:  Heparin level 0.3 - 0.7 Monitor platelets by anticoagulation protocol: Yes   Plan: . 1. Follow serial INRs (next tomorrow AM). 2. When INR <2, begin heparin infusion  Elie Goodyandy Sonam Huelsmann, PharmD, BCPS Pager: [phone] 04/03/2015  1:25 PM

## 2015-04-03 NOTE — H&P (Addendum)
[facility] History and Physical  [name] ZOX:[id number] DOB: [birth year] DOA: 04/02/2015  Referring physician: Beatris Shipr.Campos. PCP: No primary care provider on file.  Specialists: [facility]  Chief Complaint: Weakness.  HPI: [name] is a 69 y.o. male with history of diabetes mellitus type 2, previous stroke, chronic kidney disease, permanent pacemaker, history of DVT on Coumadin, hypothyroidism was brought to the ER after patient was signed persistent diarrhea and weakness. Patient's wife states that patient started developing diarrhea 2 months ago and was initially diagnosed with C. difficile and was treated with antibiotics. Despite this patient is still having diarrhea 4-5 episodes daily. Denies any nausea vomiting or abdominal pain. Patient denies any chest pain shortness of breath fever chills. Patient has become bedbound over the last 2 months because of his weakness. Prior to which patient used to be ambulatory. In the ER patient had CT abdomen and pelvis which shows possible sigmoid colitis with gallbladder stones and intraductal papillary tumor. Patient's LFTs also is markedly elevated. Patient denies any dizziness loss of consciousness or using any extra amounts of Tylenol. Denies any recent sick contacts. On-call gastroenterologist Dr. Juanda ChanceBrodie was consulted by ER physician.   Review of Systems: As presented in the history of presenting illness, rest negative.  Past Medical History  Diagnosis Date  . Diabetes mellitus without complication   . Hypertension   . Presence of permanent cardiac pacemaker   . Hypothyroidism   . Stroke    Past Surgical History  Procedure Laterality Date  . Insert / replace / remove pacemaker    . Tonsillectomy     Social History:  reports that he has never smoked. He does not have any smokeless tobacco history on file. He reports that he does not drink alcohol. His drug history is not on file. Where does patient live home. Can patient  participate in ADLs? No.  No Known Allergies  Family History:  Family History  Problem Relation Age of Onset  . Diabetes Mellitus II Sister   . CAD Sister   . Hypertension Sister       Prior to Admission medications   Medication Sig Start Date End Date Taking? Authorizing Provider  acetaminophen (TYLENOL) 325 MG tablet Take 650 mg by mouth 2 (two) times daily.   Yes Historical Provider, MD  aspirin 81 MG tablet Take 81 mg by mouth daily.   Yes Historical Provider, MD  atorvastatin (LIPITOR) 80 MG tablet Take 80 mg by mouth at bedtime.   Yes Historical Provider, MD  brimonidine (ALPHAGAN) 0.2 % ophthalmic solution Place 1 drop into the left eye 2 (two) times daily.   Yes Historical Provider, MD  budesonide-formoterol (SYMBICORT) 160-4.5 MCG/ACT inhaler Inhale 2 puffs into the lungs 2 (two) times daily.   Yes Historical Provider, MD  cetirizine (ZYRTEC) 10 MG tablet Take 10 mg by mouth daily.   Yes Historical Provider, MD  cholecalciferol (VITAMIN D) 1000 UNITS tablet Take 2,000 Units by mouth daily.   Yes Historical Provider, MD  hydroxypropyl methylcellulose / hypromellose (ISOPTO TEARS / GONIOVISC) 2.5 % ophthalmic solution Place 1 drop into both eyes 4 (four) times daily as needed for dry eyes (dry eyes).   Yes Historical Provider, MD  levothyroxine (SYNTHROID, LEVOTHROID) 100 MCG tablet Take 200 mcg by mouth daily before breakfast.   Yes Historical Provider, MD  metoprolol succinate (TOPROL-XL) 50 MG 24 hr tablet Take 25 mg by mouth daily. Take with or immediately following a meal.   Yes Historical Provider, MD  sertraline (ZOLOFT) 100 MG tablet Take 200 mg by mouth daily.   Yes Historical Provider, MD  warfarin (COUMADIN) 5 MG tablet Take 5-7.5 mg by mouth daily. Take 1 tablet (5 mg) by mouth daily on Sun, Tues, Thurs, Fri, Sat and Take 1.5 tablets (7.5 mg) by mouth on Mon and Wed.   Yes Historical Provider, MD    Physical Exam: Filed Vitals:   04/03/15 0230 04/03/15 0300 04/03/15  0330 04/03/15 0458  BP: 154/90 148/92 157/95 150/95  Pulse: 65 65 65 80  Temp:    97.4 F (36.3 C)  TempSrc:      Resp:    16  Height:      Weight:      SpO2: 95% 98% 95%      General:  Moderately built and nourished.  Eyes: Anicteric no pallor.  ENT: No discharge from the ears eyes nose and mouth.  Neck: No mass felt.  Cardiovascular: S1-S2 heard.  Respiratory: No rhonchi or crepitations.  Abdomen: Soft nontender bowel sounds present. No guarding.  Skin: No rash.  Musculoskeletal: No edema.  Psychiatric: Appears normal.  Neurologic: Alert awake oriented to time place and person. Patient moves all extremities but feels weak and has difficulty speaking from previous stroke. PERRLA positive.  Labs on Admission:  Basic Metabolic Panel:  Recent Labs Lab 04/02/15 2119  NA 136  K 3.4*  CL 100*  CO2 29  GLUCOSE 140*  BUN 22*  CREATININE 1.49*  CALCIUM 8.9   Liver Function Tests:  Recent Labs Lab 04/02/15 2119  AST 780*  ALT 560*  ALKPHOS 651*  BILITOT 0.9  PROT 7.1  ALBUMIN 2.6*    Recent Labs Lab 04/02/15 2119  LIPASE 15*   No results for input(s): AMMONIA in the last 168 hours. CBC:  Recent Labs Lab 04/02/15 2119  WBC 5.7  NEUTROABS 2.6  HGB 13.7  HCT 41.1  MCV 86.0  PLT 166   Cardiac Enzymes: No results for input(s): CKTOTAL, CKMB, CKMBINDEX, TROPONINI in the last 168 hours.  BNP (last 3 results) No results for input(s): BNP in the last 8760 hours.  ProBNP (last 3 results) No results for input(s): PROBNP in the last 8760 hours.  CBG:  Recent Labs Lab 04/03/15 0504  GLUCAP 55*    Radiological Exams on Admission: Ct Abdomen Pelvis W Contrast  04/03/2015   CLINICAL DATA:  Diarrhea. Recent diagnosis of C diff colitis. Weakness.  EXAM: CT ABDOMEN AND PELVIS WITH CONTRAST  TECHNIQUE: Multidetector CT imaging of the abdomen and pelvis was performed using the standard protocol following bolus administration of intravenous  contrast.  CONTRAST:  OMNIPAQUE IOHEXOL 300 MG/ML  SOLN  COMPARISON:  None available.  FINDINGS: Hypoventilatory change at the lung bases. The heart is enlarged. Pacemaker partially included.  Decreased hepatic density with lobular contours and diminished size the left hepatic lobe, suggestive of cirrhosis. No focal hepatic lesion. There is a periportal lymph node measuring 1 cm in short axis dimension. Calcified gallstones within physiologically distended gallbladder. No biliary dilatation. Small hypodensity in the anterior spleen, likely hemangioma. Spleen is normal in size. The adrenal glands are normal. Mild pancreatic atrophy. There are multiple hypodensities scattered throughout the pancreatic parenchyma. The pancreatic head this measures 1.5 cm, in the distal body 0.9 cm, and in the tail 0.9 cm. Kidneys demonstrate symmetric enhancement and excretion. Small subcentimeter bilateral renal cysts. Within the upper left kidney is a 1.4 cm lesion measuring soft tissue density, Hounsfield units of 38.  Small amount contrast of the distal esophagus. Stomach is decompressed. There are no dilated or thickened bowel loops. Midline ventral hernia contains normal appearing loops of small bowel. There is minimal colonic wall thickening involving the sigmoid colon without significant surrounding pericolonic inflammatory change. The remainder the colon is decompressed. Distal colonic diverticulosis without diverticulitis. The appendix is normal. No free air, free fluid, or intra-abdominal fluid collection.  No retroperitoneal adenopathy. Abdominal aorta is normal in caliber. Moderate atherosclerosis without aneurysm.  Within the pelvis the urinary bladder is distended, air in the bladder is likely related to instrumentation. Prostate gland appears prominent. There is no pelvic free fluid. No pelvic adenopathy. Probable post left inguinal hernia repair.  There are no acute or suspicious osseous abnormalities. Cortical  irregularity about the right anterior iliac crest suggestive of remote prior injury. There scattered bone islands.  IMPRESSION: 1. Minimal colonic wall thickening involving the sigmoid colon, may reflect mild colitis. 2. Ventral abdominal wall hernia containing normal appearing loops of small bowel without bowel compromise or obstruction. 3. Nodular hepatic contours, suggestive of underlying cirrhosis. No focal hepatic lesion. 4. Small round hypodensities within the pancreas, largest measuring 1.5 cm. 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 In the absence of prior imaging, nonemergent MRCP could be considered if patient is able tolerate breath hold technique. 5. Cholelithiasis without cholecystitis. 6. Bilateral renal cysts. In addition there is a left upper pole 1.5 cm lesion measuring higher than simple fluid density. This may reflect a complex or hemorrhagic cyst. Correlation with any prior imaging would be most helpful. In the absence of prior imaging, correlation with contrast-enhanced MRI is recommended.   Electronically Signed   By: Rubye Oaks M.D.   On: 04/03/2015 01:38     Assessment/Plan Principal Problem:   Weakness Active Problems:   Elevated liver function tests   Diarrhea   Diabetes mellitus type 2, uncontrolled   Hypothyroidism   Essential hypertension   Gallstones   CKD (chronic kidney disease) stage 3, GFR 30-59 ml/min   1. Generalized weakness suspect mainly from severe diarrhea -patient is a poor reflexes but does not have any tingling numbness or any new focal deficits. Will get CT head if weakness persists. Check EKG and troponins CK and TSH. I suspect patient's weakness is mostly likely secondary to diarrhea. PT consult. For which we will continue with IV fluids. 2. Persistent diarrhea with CAT scan showing colitis - since patient has had previous episode of C. difficile I have placed patient on oral vancomycin. Check  lactate levels. Check stool for C. difficile and other studies as ordered. 3. 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 Acute hepatitis panel and tylenol levels has been ordered. Patient probably will need MRCP but cannot do because of pacemaker and gastroenterology will be seeing patient in consult. Follow LFTs. 4. History of DVT on Coumadin - since patient is anticipated to have procedures I have held Coumadin and place patient on heparin. Type and screen. 5. Diabetes mellitus type 2 uncontrolled - patient was mildly hypoglycemic in the morning. At this time mainly cause following CBGs. Patient takes Lantus 20 units twice daily and at this time I have decreased the dose to 10 units at bedtime closely follow CBGs and sliding scale coverage. 6. History of stroke on aspirin. 7. Hypothyroidism Synthroid. 8. Chronic kidney disease stage III with mild hypokalemia- creatinine appears to be at baseline.  Replace potassium and follow metabolic panel.  9. History of pacemaker placement.   Patient eventually will need placement.   DVT Prophylaxisheparin infusion.  Code Status: Full code.  Family Communication: Patient's wife.  Disposition Plan: Admit to inpatient.    Yusif Gnau N. [facility] Pager [phone].  If 7PM-7AM, please contact night-coverage [email or web address] Password [email or web address] 04/03/2015, 5:54 AM

## 2015-04-03 NOTE — ED Notes (Signed)
Pt returned from CT °

## 2015-04-03 NOTE — ED Notes (Signed)
Pt had episode of urinary and bowel incontinence. Pt was cleaned with incontinent cleanser and new linen applied.

## 2015-04-03 NOTE — Consult Note (Signed)
Consultation  Referring Provider:  [facility]    Primary Care Physician:  [facility] Primary Gastroenterologist:  unassigned       Reason for Consultation:    Elevated LFTs          HPI:   [name] is a 69 y.o. male with multiple medical problems who presented to ED yesterday with weakness. He was diagnosed with C-diff by [facility] two months ago. Patient was apparently treated first with flagyl followed by Vancomycin. He reports completion of the antibiotics without resolution of diarrhea. C-diff still positive. Patient also found to have markedly elevated LFT. He denies previous history of liver problems. CT scan suggests cirrhosis as well as cholelithiasis without CBD dilation. Patient denies abdominal pain.   Past Medical History  Diagnosis Date  . Diabetes mellitus without complication   . Hypertension   . Presence of permanent cardiac pacemaker   . Hypothyroidism   . Stroke     Past Surgical History  Procedure Laterality Date  . Insert / replace / remove pacemaker    . Tonsillectomy      Family History  Problem Relation Age of Onset  . Diabetes Mellitus II Sister   . CAD Sister   . Hypertension Sister      History  Substance Use Topics  . Smoking status: Never Smoker   . Smokeless tobacco: Not on file  . Alcohol Use: No    Prior to Admission medications   Medication Sig Start Date End Date Taking? Authorizing Provider  acetaminophen (TYLENOL) 325 MG tablet Take 650 mg by mouth 2 (two) times daily.   Yes Historical Provider, MD  aspirin 81 MG tablet Take 81 mg by mouth daily.   Yes Historical Provider, MD  atorvastatin (LIPITOR) 80 MG tablet Take 80 mg by mouth at bedtime.   Yes Historical Provider, MD  brimonidine (ALPHAGAN) 0.2 % ophthalmic solution Place 1 drop into the left eye 2 (two) times daily.   Yes Historical Provider, MD  budesonide-formoterol (SYMBICORT) 160-4.5 MCG/ACT inhaler Inhale 2 puffs into the lungs 2 (two) times daily.   Yes  Historical Provider, MD  cetirizine (ZYRTEC) 10 MG tablet Take 10 mg by mouth daily.   Yes Historical Provider, MD  cholecalciferol (VITAMIN D) 1000 UNITS tablet Take 2,000 Units by mouth daily.   Yes Historical Provider, MD  hydroxypropyl methylcellulose / hypromellose (ISOPTO TEARS / GONIOVISC) 2.5 % ophthalmic solution Place 1 drop into both eyes 4 (four) times daily as needed for dry eyes (dry eyes).   Yes Historical Provider, MD  levothyroxine (SYNTHROID, LEVOTHROID) 100 MCG tablet Take 200 mcg by mouth daily before breakfast.   Yes Historical Provider, MD  metoprolol succinate (TOPROL-XL) 50 MG 24 hr tablet Take 25 mg by mouth daily. Take with or immediately following a meal.   Yes Historical Provider, MD  sertraline (ZOLOFT) 100 MG tablet Take 200 mg by mouth daily.   Yes Historical Provider, MD  warfarin (COUMADIN) 5 MG tablet Take 5-7.5 mg by mouth daily. Take 1 tablet (5 mg) by mouth daily on Sun, Tues, Thurs, Fri, Sat and Take 1.5 tablets (7.5 mg) by mouth on Mon and Wed.   Yes Historical Provider, MD    Current Facility-Administered Medications  Medication Dose Route Frequency Provider Last Rate Last Dose  . 0.9 % NaCl with KCl 20 mEq/ L  infusion   Intravenous Continuous Rise Patience, MD 75 mL/hr at 04/03/15 0630    . aspirin  chewable tablet 81 mg  81 mg Oral Daily Rise Patience, MD   81 mg at 04/03/15 1024  . brimonidine (ALPHAGAN) 0.2 % ophthalmic solution 1 drop  1 drop Left Eye BID Rise Patience, MD   1 drop at 04/03/15 1023  . budesonide-formoterol (SYMBICORT) 160-4.5 MCG/ACT inhaler 2 puff  2 puff Inhalation BID Rise Patience, MD      . insulin aspart (novoLOG) injection 0-9 Units  0-9 Units Subcutaneous TID WC Rise Patience, MD   0 Units at 04/03/15 0751  . insulin glargine (LANTUS) injection 10 Units  10 Units Subcutaneous QHS Rise Patience, MD      . levothyroxine (SYNTHROID, LEVOTHROID) tablet 200 mcg  200 mcg Oral QAC breakfast Rise Patience, MD   200 mcg at 04/03/15 0850  . loratadine (CLARITIN) tablet 10 mg  10 mg Oral Daily Rise Patience, MD   10 mg at 04/03/15 1024  . metoprolol succinate (TOPROL-XL) 24 hr tablet 25 mg  25 mg Oral Daily Rise Patience, MD   25 mg at 04/03/15 1029  . ondansetron (ZOFRAN) tablet 4 mg  4 mg Oral Q6H PRN Rise Patience, MD       Or  . ondansetron [facility]) injection 4 mg  4 mg Intravenous Q6H PRN Rise Patience, MD      . polyvinyl alcohol (LIQUIFILM TEARS) 1.4 % ophthalmic solution 1 drop  1 drop Both Eyes Daily PRN Rise Patience, MD      . saccharomyces boulardii (FLORASTOR) capsule 250 mg  250 mg Oral BID Rise Patience, MD   250 mg at 04/03/15 1024  . sertraline (ZOLOFT) tablet 200 mg  200 mg Oral Daily Rise Patience, MD   200 mg at 04/03/15 1024  . vancomycin (VANCOCIN) 50 mg/mL oral solution 125 mg  125 mg Oral 4 times per day Dorrene German, RPH   125 mg at 04/03/15 0850    Allergies as of 04/02/2015  . (No Known Allergies)     Review of Systems:    As per HPI, otherwise negative    Physical Exam:  Vital signs in last 24 hours: Temp:  [97.4 F (36.3 C)-97.7 F (36.5 C)] 97.4 F (36.3 C) (06/06 0458) Pulse Rate:  [60-85] 85 (06/06 1029) Resp:  [16] 16 (06/06 0458) BP: (148-168)/(81-95) 156/90 mmHg (06/06 1029) SpO2:  [95 %-98 %] 95 % (06/06 0330) Weight:  [260 lb (117.935 kg)] 260 lb (117.935 kg) (06/05 2103) Last BM Date: 04/03/15 General:   Pleasant black male in NAD Head:  Normocephalic and atraumatic. Eyes:   No icterus.   Conjunctiva pink. Ears:  Normal auditory acuity. Neck:  Supple Lungs:  Respirations even and unlabored. Lungs clear to auscultation bilaterally.   No wheezes, crackles, or rhonchi.  Heart:  Regular rate and rhythm; no MRG Abdomen:  Soft, nondistended, nontender. Normal bowel sounds. No appreciable masses or hepatomegaly.  Rectal:  Not performed.  Msk:  Symmetrical without gross deformities.    Extremities:  Without edema. Neurologic:  Alert and  oriented x4;  grossly normal neurologically. Skin:  Intact without significant lesions or rashes. Psych:  Alert and cooperative. Normal affect.  LAB RESULTS:  Recent Labs  04/02/15 2119 04/03/15 0650  WBC 5.7 6.6  HGB 13.7 13.1  HCT 41.1 40.1  PLT 166 167   BMET  Recent Labs  04/02/15 2119 04/03/15 0650  NA 136 138  K 3.4* 3.0*  CL 100*  101  CO2 29 28  GLUCOSE 140* 120*  BUN 22* 22*  CREATININE 1.49* 1.33*  CALCIUM 8.9 8.8*   LFT  Recent Labs  04/03/15 0650  PROT 6.9  ALBUMIN 2.5*  AST 795*  ALT 547*  ALKPHOS 601*  BILITOT 1.1  BILIDIR 0.6*  IBILI 0.5   PT/INR  Recent Labs  04/02/15 2312  LABPROT 31.3*  INR 3.09*    STUDIES: Ct Abdomen Pelvis W Contrast  04/03/2015   CLINICAL DATA:  Diarrhea. Recent diagnosis of C diff colitis. Weakness.  EXAM: CT ABDOMEN AND PELVIS WITH CONTRAST  TECHNIQUE: Multidetector CT imaging of the abdomen and pelvis was performed using the standard protocol following bolus administration of intravenous contrast.  CONTRAST:  161m OMNIPAQUE IOHEXOL 300 MG/ML  SOLN  COMPARISON:  None available.  FINDINGS: Hypoventilatory change at the lung bases. The heart is enlarged. Pacemaker partially included.  Decreased hepatic density with lobular contours and diminished size the left hepatic lobe, suggestive of cirrhosis. No focal hepatic lesion. There is a periportal lymph node measuring 1 cm in short axis dimension. Calcified gallstones within physiologically distended gallbladder. No biliary dilatation. Small hypodensity in the anterior spleen, likely hemangioma. Spleen is normal in size. The adrenal glands are normal. Mild pancreatic atrophy. There are multiple hypodensities scattered throughout the pancreatic parenchyma. The pancreatic head this measures 1.5 cm, in the distal body 0.9 cm, and in the tail 0.9 cm. Kidneys demonstrate symmetric enhancement and excretion. Small subcentimeter  bilateral renal cysts. Within the upper left kidney is a 1.4 cm lesion measuring soft tissue density, Hounsfield units of 38.  Small amount contrast of the distal esophagus. Stomach is decompressed. There are no dilated or thickened bowel loops. Midline ventral hernia contains normal appearing loops of small bowel. There is minimal colonic wall thickening involving the sigmoid colon without significant surrounding pericolonic inflammatory change. The remainder the colon is decompressed. Distal colonic diverticulosis without diverticulitis. The appendix is normal. No free air, free fluid, or intra-abdominal fluid collection.  No retroperitoneal adenopathy. Abdominal aorta is normal in caliber. Moderate atherosclerosis without aneurysm.  Within the pelvis the urinary bladder is distended, air in the bladder is likely related to instrumentation. Prostate gland appears prominent. There is no pelvic free fluid. No pelvic adenopathy. Probable post left inguinal hernia repair.  There are no acute or suspicious osseous abnormalities. Cortical irregularity about the right anterior iliac crest suggestive of remote prior injury. There scattered bone islands.  IMPRESSION: 1. Minimal colonic wall thickening involving the sigmoid colon, may reflect mild colitis. 2. Ventral abdominal wall hernia containing normal appearing loops of small bowel without bowel compromise or obstruction. 3. Nodular hepatic contours, suggestive of underlying cirrhosis. No focal hepatic lesion. 4. Small round hypodensities within the pancreas, largest measuring 1.5 cm. 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 In the absence of prior imaging, nonemergent MRCP could be considered if patient is able tolerate breath hold technique. 5. Cholelithiasis without cholecystitis. 6. Bilateral renal cysts. In addition there is a left upper pole 1.5 cm lesion measuring higher than simple fluid density. This may reflect  a complex or hemorrhagic cyst. Correlation with any prior imaging would be most helpful. In the absence of prior imaging, correlation with contrast-enhanced MRI is recommended.   Electronically Signed   By: MJeb LeveringM.D.   On: 04/03/2015 01:38    PREVIOUS ENDOSCOPIES:            Patient thinks he  had a normal colonoscopy at [facility] in March   Impression / Plan:   25. 69 year old male with abnormal LFTs, mixed picture. Transaminases in 500-700 range, alk phos 600's. Normal bilirubin. CTscan suggests cirrhosis. He has cholelithiasis but no CBD dilation nor abdominal pain to suggest choledocholithiasis. Patient complains of significant weakness. Will check viral hepatitis studies, ANA, ASMA. He may need MRCP. Recheck LFTs in am.   2. Diarrhea, recently treated for C-diff. Diarrhea persistent, C. diff positive again. CTscan reveals minimal sigmoid colitis.   3. Hx of dvt, on coumadin  4. Abnormal imaging of pancreas. CT scan suggests numerous hypodensities within pancreas,  possibly reflecting IPMN. MRCP may help better characterize these lesions.   Thanks   LOS: 0 days   Tye Savoy  04/03/2015, 11:16 AM     Attending physician's note   I have taken a history, examined the patient and reviewed the chart. I agree with the Advanced Practitioner's note, impression and recommendations. 69 year old male who receives his health care at [facility]. Admitted with elevated LFTs, diarrhea and weakness. Acute hepatic injury-etiology unclear. Cholelithiasis but no biliary dilation to suggest CBD stones. Pancreatic lesions concerning for IPMN. MRI/MRCP would be helpful to further evaluate however he has a pacemaker. Await hepatic serologies. Monitor LFTs, PT/INR. Vancomycin for 3 weeks and then pulse/taper for recurrent C diff.   Ladene Artist, MD [facility]

## 2015-04-03 NOTE — Progress Notes (Signed)
Patient admitted after midnight. Chart reviewed. Patient examined. Appears very weak. Might need placement. Cannot have MRCP, as he has a pacemaker. GI following. Appreciate assistance.  Crista Curborinna Ascher Schroepfer, MD [facility]

## 2015-04-03 NOTE — Progress Notes (Signed)
ANTICOAGULATION CONSULT NOTE - Initial Consult  Pharmacy Consult for Heparin Indication: Hx of DVT  No Known Allergies  Patient Measurements: Height: 6\' 3"  (190.5 cm) Weight: 260 lb (117.935 kg) IBW/kg (Calculated) : 84.5   Vital Signs: Temp: 97.4 F (36.3 C) (06/06 0458) Temp Source: Oral (06/06 0113) BP: 150/95 mmHg (06/06 0458) Pulse Rate: 80 (06/06 0458)  Labs:  Recent Labs  04/02/15 2119 04/02/15 2312 04/03/15 0650  HGB 13.7  --  13.1  HCT 41.1  --  40.1  PLT 166  --  167  APTT  --   --  75*  LABPROT  --  31.3*  --   INR  --  3.09*  --   CREATININE 1.49*  --   --     Estimated Creatinine Clearance: 63 mL/min (by C-G formula based on Cr of 1.49).   Medical History: Past Medical History  Diagnosis Date  . Diabetes mellitus without complication   . Hypertension   . Presence of permanent cardiac pacemaker   . Hypothyroidism   . Stroke     Medications:  Scheduled:  . aspirin  81 mg Oral Daily  . brimonidine  1 drop Left Eye BID  . budesonide-formoterol  2 puff Inhalation BID  . insulin aspart  0-9 Units Subcutaneous TID WC  . insulin glargine  10 Units Subcutaneous QHS  . levothyroxine  200 mcg Oral QAC breakfast  . loratadine  10 mg Oral Daily  . metoprolol succinate  25 mg Oral Daily  . saccharomyces boulardii  250 mg Oral BID  . sertraline  200 mg Oral Daily  . vancomycin  125 mg Oral 4 times per day   Infusions:  . 0.9 % NaCl with KCl 20 mEq / L 75 mL/hr at 04/03/15 0630   PRN: ondansetron **OR** ondansetron (ZOFRAN) IV, polyvinyl alcohol  Assessment: 69 y/o M with PMH that includes but is not limited to stroke, DVT, and CDAD, on warfarin PTA with dosage reported as 5 mg PO daily except 7.5 mg Mondays and Wednesdays (last taken 6/5), admitted through ED 04/03/15 with generalized weakness in setting of severe persistent diarrhea, markedly elevated hepatic transaminases, and CT abd suggestive of mild colitis.  GI consult is pending.  Warfarin was  held for now and pharmacy consult was requested for IV heparin.  Baseline INR is supratherapeutic (3.09). No documented suspicion of any acute thromboembolic process.   Goal of Therapy:  Heparin level 0.3 - 0.7 Monitor platelets by anticoagulation protocol: Yes   Plan: . Follow serial INRs (next due today at 12n). When INR <2, begin heparin infusion  Elie Goodyandy Tracey Stewart, PharmD, BCPS Pager: [phone] 04/03/2015  7:34 AM

## 2015-04-03 NOTE — ED Provider Notes (Signed)
3:07 AM Patient has been weak for 2 months.  He's really not gotten on the bed for 2 months.  He's had persistent diarrhea.  Case was discussed with GI to see the patient in the hospital.  CT scan demonstrates possible mild colitis.  There is also abnormalities to suggest cirrhosis as well as noted gallstones without choledocholithiasis.  There is some abnormality around his pancreas.  He does like her to have symptoms of the pancreatic abnormality and/or come bile duct stone.  MRCP is probably a beneficial next step.  GI will lay and tomorrow.  Patient will display need physical therapy and occupational therapy.  It sounds like at home he has had no physical therapy.  Azalia BilisKevin Jonn Chaikin, MD 04/03/15 [phone]

## 2015-04-04 ENCOUNTER — Inpatient Hospital Stay (HOSPITAL_COMMUNITY): Payer: Medicare Other

## 2015-04-04 DIAGNOSIS — A047 Enterocolitis due to Clostridium difficile: Secondary | ICD-10-CM

## 2015-04-04 LAB — ANTINUCLEAR ANTIBODIES, IFA

## 2015-04-04 LAB — PROTIME-INR
INR: 3.54 — ABNORMAL HIGH (ref 0.00–1.49)
Prothrombin Time: 34.7 seconds — ABNORMAL HIGH (ref 11.6–15.2)

## 2015-04-04 LAB — HEPATITIS PANEL, ACUTE
HCV Ab: <0.1 s/co ratio (ref 0.0–0.9)
Hep A IgM: NEGATIVE
Hep A IgM: NEGATIVE
Hep B C IgM: NEGATIVE
Hep B C IgM: NEGATIVE
Hepatitis B Surface Ag: NEGATIVE
Hepatitis B Surface Ag: NEGATIVE

## 2015-04-04 LAB — COMPREHENSIVE METABOLIC PANEL
ALK PHOS: 668 U/L — AB (ref 38–126)
ALT: 508 U/L — ABNORMAL HIGH (ref 17–63)
AST: 744 U/L — ABNORMAL HIGH (ref 15–41)
Albumin: 2.3 g/dL — ABNORMAL LOW (ref 3.5–5.0)
Anion gap: 8 (ref 5–15)
BUN: 19 mg/dL (ref 6–20)
CALCIUM: 8.7 mg/dL — AB (ref 8.9–10.3)
CHLORIDE: 102 mmol/L (ref 101–111)
CO2: 26 mmol/L (ref 22–32)
Creatinine, Ser: 1.3 mg/dL — ABNORMAL HIGH (ref 0.61–1.24)
GFR calc non Af Amer: 54 mL/min — ABNORMAL LOW (ref >60)
Glucose, Bld: 139 mg/dL — ABNORMAL HIGH (ref 65–99)
Potassium: 3.4 mmol/L — ABNORMAL LOW (ref 3.5–5.1)
SODIUM: 136 mmol/L (ref 135–145)
Total Bilirubin: 1.1 mg/dL (ref 0.3–1.2)
Total Protein: 6.8 g/dL (ref 6.5–8.1)

## 2015-04-04 LAB — FANA STAINING PATTERNS

## 2015-04-04 LAB — HIV ANTIBODY (ROUTINE TESTING W REFLEX): HIV SCREEN 4TH GENERATION: NONREACTIVE

## 2015-04-04 LAB — GLUCOSE, CAPILLARY
Glucose-Capillary: 113 mg/dL — ABNORMAL HIGH (ref 65–99)
Glucose-Capillary: 124 mg/dL — ABNORMAL HIGH (ref 65–99)
Glucose-Capillary: 120 mg/dL — ABNORMAL HIGH (ref 65–99)
Glucose-Capillary: 128 mg/dL — ABNORMAL HIGH (ref 65–99)

## 2015-04-04 LAB — ANTI-SMOOTH MUSCLE ANTIBODY, IGG: F-ACTIN AB IGG: 71 U — AB (ref 0–19)

## 2015-04-04 LAB — TROPONIN I
TROPONIN I: 0.4 ng/mL — AB (ref <0.031)
TROPONIN I: 0.38 ng/mL — AB (ref <0.031)
Troponin I: 0.38 ng/mL — ABNORMAL HIGH (ref <0.031)

## 2015-04-04 MED ORDER — TECHNETIUM TC 99M MEBROFENIN IV KIT
5.5000 | PACK | Freq: Once | INTRAVENOUS | Status: AC | PRN
  Administered 2015-04-04: 6 via INTRAVENOUS

## 2015-04-04 NOTE — Evaluation (Signed)
Physical Therapy Evaluation Patient Details Name: Lindbergh Winkles MRN: [id number] DOB: 1945-12-13 Today's Date: 04/04/2015   History of Present Illness  Ubaldo Daywalt is a 69 y.o. male with multiple medical problems who presented to ED yesterday with weakness. He was diagnosed with C-diff by [facility] two months ago. C-diff still positive. 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  Pt currently requiring mod to max assist of 2 to safely perform most basic mobility tasks.  Pt would benefit from follow up rehab at SNF level to maximize safety and IND.    Follow Up Recommendations SNF    Equipment Recommendations  None recommended by PT    Recommendations for Other Services OT consult     Precautions / Restrictions Precautions Precautions: Fall Restrictions Weight Bearing Restrictions: No      Mobility  Bed Mobility Overal bed mobility: Needs Assistance Bed Mobility: Supine to Sit;Sit to Supine     Supine to sit: +2 for physical assistance;Mod assist Sit to supine: +2 for physical assistance;Max assist   General bed mobility comments: cues for sequencing and use of LEs and UEs to self assist.  Transfers Overall transfer level: Needs assistance Equipment used: Rolling walker (2 wheeled) Transfers: Sit to/from Stand Sit to Stand: Max assist;+2 physical assistance;From elevated surface         General transfer comment: Pt achieved semi-erect standing and maintained x ~30 seconds before needing to sit  Ambulation/Gait                Stairs            Wheelchair Mobility    Modified Rankin (Stroke Patients Only)       Balance Overall balance assessment: Needs assistance Sitting-balance support: Bilateral upper extremity supported;Feet supported Sitting balance-Leahy  Scale: Poor   Postural control: Posterior lean;Right lateral lean Standing balance support: Bilateral upper extremity supported Standing balance-Leahy Scale: Zero                               Pertinent Vitals/Pain Pain Assessment: No/denies pain    Home Living Family/patient expects to be discharged to:: Skilled nursing facility                      Prior Function Level of Independence: Needs assistance               Hand Dominance   Dominant Hand: Right    Extremity/Trunk Assessment   Upper Extremity Assessment: Generalized weakness RUE Deficits / Details: Grossly 3/5 but with diminished fine motor and noted tremor     LUE Deficits / Details: Grossly 3/5 strength but with diimished fine motor and noted tremor   Lower Extremity Assessment: RLE deficits/detail;LLE deficits/detail;Generalized weakness;Difficult to assess due to impaired cognition RLE Deficits / Details: Pt with difficulty following cues for accurate assessment of strength/ROM LLE Deficits / Details: Pt with difficulty following cues for accurate assessment of strength/ROM     Communication   Communication: Expressive difficulties  Cognition Arousal/Alertness: Awake/alert Behavior During Therapy: Flat affect Overall Cognitive Status: No family/caregiver present to determine baseline cognitive functioning Area of Impairment: Following commands;Safety/judgement       Following Commands: Follows one step commands with increased time;Follows one step commands inconsistently  Safety/Judgement: Decreased awareness of safety          General Comments      Exercises        Assessment/Plan    PT Assessment Patient needs continued PT services  PT Diagnosis Difficulty walking;Generalized weakness   PT Problem List Decreased strength;Decreased range of motion;Decreased activity tolerance;Decreased balance;Decreased mobility;Decreased knowledge of use of DME;Decreased  cognition;Decreased safety awareness;Obesity;Pain  PT Treatment Interventions DME instruction;Functional mobility training;Therapeutic activities;Therapeutic exercise;Balance training;Patient/family education;Cognitive remediation   PT Goals (Current goals can be found in the Care Plan section) Acute Rehab PT Goals Patient Stated Goal: get stronger PT Goal Formulation: With patient Time For Goal Achievement: 04/18/15 Potential to Achieve Goals: Fair    Frequency Min 3X/week   Barriers to discharge        Co-evaluation               End of Session Equipment Utilized During Treatment: Gait belt Activity Tolerance: Patient tolerated treatment well;Patient limited by fatigue Patient left: in bed;with call bell/phone within reach Nurse Communication: Mobility status;Need for lift equipment         Time: [id number] PT Time Calculation (min) (ACUTE ONLY): 26 min   Charges:   PT Evaluation $Initial PT Evaluation Tier I: 1 Procedure PT Treatments $Therapeutic Activity: 8-22 mins   PT G Codes:        Niharika Savino 04/04/2015, 4:09 PM

## 2015-04-04 NOTE — Progress Notes (Signed)
Progress Note   Subjective  no abdominal pain, no diarrhea   Objective   Vital signs in last 24 hours: Temp:  [97.5 F (36.4 C)-97.9 F (36.6 C)] 97.9 F (36.6 C) (06/07 7209) Pulse Rate:  [78-84] 78 (06/07 0637) Resp:  [18] 18 (06/07 0637) BP: (161-169)/(86-97) 161/86 mmHg (06/07 0637) SpO2:  [97 %-99 %] 99 % (06/07 1009) Last BM Date: 04/03/15 General:    Black male in NAD Abdomen:  Soft, nontender and nondistended. Normal bowel sounds. Psych:  Cooperative. Makes eye contact and answers questions appropriately.    Lab Results:  Recent Labs  04/02/15 2119 04/03/15 0650  WBC 5.7 6.6  HGB 13.7 13.1  HCT 41.1 40.1  PLT 166 167   BMET  Recent Labs  04/02/15 2119 04/03/15 0650 04/04/15 0512  NA 136 138 136  K 3.4* 3.0* 3.4*  CL 100* 101 102  CO2 _0 GLUCOSE 140* 120* 139*  BUN 22* 22* 19  CREATININE 1.49* 1.33* 1.30*  CALCIUM 8.9 8.8* 8.7*   LFT  Recent Labs  04/03/15 0650 04/04/15 0512  PROT 6.9 6.8  ALBUMIN 2.5* 2.3*  AST 795* 744*  ALT 547* 508*  ALKPHOS 601* 668*  BILITOT 1.1 1.1  BILIDIR 0.6*  --   IBILI 0.5  --    PT/INR  Recent Labs  04/03/15 1240 04/04/15 0512  LABPROT 33.0* 34.7*  INR 3.31* 3.54*    Studies/Results: Ct Abdomen Pelvis W Contrast  04/03/2015   CLINICAL DATA:  Diarrhea. Recent diagnosis of C diff colitis. Weakness.  EXAM: CT ABDOMEN AND PELVIS WITH CONTRAST  TECHNIQUE: Multidetector CT imaging of the abdomen and pelvis was performed using the standard protocol following bolus administration of intravenous contrast.  CONTRAST:  144m OMNIPAQUE IOHEXOL 300 MG/ML  SOLN  COMPARISON:  None available.  FINDINGS: Hypoventilatory change at the lung bases. The heart is enlarged. Pacemaker partially included.  Decreased hepatic density with lobular contours and diminished size the left hepatic lobe, suggestive of cirrhosis. No focal hepatic lesion. There is a periportal lymph node measuring 1 cm in short axis dimension.  Calcified gallstones within physiologically distended gallbladder. No biliary dilatation. Small hypodensity in the anterior spleen, likely hemangioma. Spleen is normal in size. The adrenal glands are normal. Mild pancreatic atrophy. There are multiple hypodensities scattered throughout the pancreatic parenchyma. The pancreatic head this measures 1.5 cm, in the distal body 0.9 cm, and in the tail 0.9 cm. Kidneys demonstrate symmetric enhancement and excretion. Small subcentimeter bilateral renal cysts. Within the upper left kidney is a 1.4 cm lesion measuring soft tissue density, Hounsfield units of 38.  Small amount contrast of the distal esophagus. Stomach is decompressed. There are no dilated or thickened bowel loops. Midline ventral hernia contains normal appearing loops of small bowel. There is minimal colonic wall thickening involving the sigmoid colon without significant surrounding pericolonic inflammatory change. The remainder the colon is decompressed. Distal colonic diverticulosis without diverticulitis. The appendix is normal. No free air, free fluid, or intra-abdominal fluid collection.  No retroperitoneal adenopathy. Abdominal aorta is normal in caliber. Moderate atherosclerosis without aneurysm.  Within the pelvis the urinary bladder is distended, air in the bladder is likely related to instrumentation. Prostate gland appears prominent. There is no pelvic free fluid. No pelvic adenopathy. Probable post left inguinal hernia repair.  There are no acute or suspicious osseous abnormalities. Cortical irregularity about the right anterior iliac crest suggestive of remote prior injury. There scattered bone islands.  IMPRESSION:  1. Minimal colonic wall thickening involving the sigmoid colon, may reflect mild colitis. 2. Ventral abdominal wall hernia containing normal appearing loops of small bowel without bowel compromise or obstruction. 3. Nodular hepatic contours, suggestive of underlying cirrhosis. No focal  hepatic lesion. 4. Small round hypodensities within the pancreas, largest measuring 1.5 cm. 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 In the absence of prior imaging, nonemergent MRCP could be considered if patient is able tolerate breath hold technique. 5. Cholelithiasis without cholecystitis. 6. Bilateral renal cysts. In addition there is a left upper pole 1.5 cm lesion measuring higher than simple fluid density. This may reflect a complex or hemorrhagic cyst. Correlation with any prior imaging would be most helpful. In the absence of prior imaging, correlation with contrast-enhanced MRI is recommended.   Electronically Signed   By: Jeb Levering M.D.   On: 04/03/2015 01:38     Assessment / Plan:   41. 69 year old male with abnormal LFTs, mixed picture. Transaminases in 500-700 range, alk phos 600's. Normal bilirubin. CTscan suggests cirrhosis. He has cholelithiasis but no CBD dilation nor abdominal pain to suggest choledocholithiasis. Mild improvement in transaminases overnight but alk phos continues to rise. Bili remains normal. Hep A, B, C negative.  ANA, ASMA pending. Unfortunately he is not a candidate for MRCP-has pacemaker.   2. Diarrhea, recently treated for C-diff. Diarrhea persistent, C. diff positive again. CTscan reveals minimal sigmoid colitis. On vancomycin.   3. Hx of dvt, coumadin on hold but INR still rising -- at 3.54.  4. Abnormal imaging of pancreas. 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 Can't have MR secondary to pacemaker. May need EUS at some point.     LOS: 1 day   Tye Savoy  04/04/2015, 10:39 AM     Attending physician's note   I have taken an interval history, reviewed the chart and examined the patient. I agree with the Advanced Practitioner's note, impression and recommendations. Transaminases improved but remain significantly elevated. Awaiting AMA, ASMA serologies.  Consider EUS to evaluate for possible CBD stones and to evaluate pancreatic lesions-will discuss with Dr. Ardis Hughs.   Pricilla Riffle. Fuller Plan, MD [facility]

## 2015-04-04 NOTE — Progress Notes (Signed)
CSW assisting with d/c planning. [facility] / [facility]. [facility] [phone]( [phone] and clinicals sent for review. A decision is pending. CSW will contact contact the [facility] in the am and update pt / spouse.   Cori RazorJamie Rosbel Buckner LCSW [phone]

## 2015-04-04 NOTE — Progress Notes (Signed)
ANTICOAGULATION CONSULT NOTE   Pharmacy Consult for Heparin (warfarin on hold for possible procedures) Indication: Hx of DVT  No Known Allergies  Patient Measurements: Height: 6\' 3"  (190.5 cm) Weight: 260 lb (117.935 kg) IBW/kg (Calculated) : 84.5   Vital Signs: Temp: 97.9 F (36.6 C) (06/07 0637) Temp Source: Oral (06/07 0637) BP: 161/86 mmHg (06/07 0637) Pulse Rate: 78 (06/07 0637)  Labs:  Recent Labs  04/02/15 2119 04/02/15 2312 04/03/15 0650 04/03/15 1240 04/04/15 0512  HGB 13.7  --  13.1  --   --   HCT 41.1  --  40.1  --   --   PLT 166  --  167  --   --   APTT  --   --  75*  --   --   LABPROT  --  31.3*  --  33.0* 34.7*  INR  --  3.09*  --  3.31* 3.54*  CREATININE 1.49*  --  1.33*  --  1.30*  CKTOTAL  --   --  45*  --   --   TROPONINI  --   --  0.41*  --   --     Estimated Creatinine Clearance: 72.2 mL/min (by C-G formula based on Cr of 1.3).   Medical History: Past Medical History  Diagnosis Date  . Diabetes mellitus without complication   . Hypertension   . Presence of permanent cardiac pacemaker   . Hypothyroidism   . Stroke     Medications:  Scheduled:  . aspirin  81 mg Oral Daily  . brimonidine  1 drop Left Eye BID  . budesonide-formoterol  2 puff Inhalation BID  . insulin aspart  0-9 Units Subcutaneous TID WC  . insulin glargine  10 Units Subcutaneous QHS  . levothyroxine  200 mcg Oral QAC breakfast  . loratadine  10 mg Oral Daily  . metoprolol succinate  25 mg Oral Daily  . potassium chloride  40 mEq Oral BID  . saccharomyces boulardii  250 mg Oral BID  . sertraline  200 mg Oral Daily  . vancomycin  125 mg Oral 4 times per day   Infusions:    PRN: ondansetron **OR** ondansetron (ZOFRAN) IV, polyvinyl alcohol  Assessment: 69 y/o M with PMH that includes but is not limited to stroke, DVT, and CDAD, on warfarin PTA with dosage reported as 5 mg PO daily except 7.5 mg Mondays and Wednesdays (last taken 6/5), admitted through ED 04/03/15  with generalized weakness in setting of severe persistent diarrhea, markedly elevated hepatic transaminases, and CT abd suggestive of mild colitis.  GI consult is pending.  Warfarin was held for now and pharmacy consult was requested for IV heparin.  INR supratherapeutic (3.09) on admission. No documented suspicion of any acute thromboembolic process.      Today,  04/04/2015  INR = 3.54, INR remains supratherapeutic and is rising   Goal Range when heparin starts:  Heparin level 0.3 - 0.7 Monitor platelets by anticoagulation protocol: Yes   Plan: . 1. Continue holding warfarin and Follow serial INRs (next tomorrow AM). 2. When INR <2, begin heparin infusion  Juliette Alcideustin Zeigler, PharmD, BCPS.   Pager: [phone] 04/04/2015  11:19 AM

## 2015-04-04 NOTE — Evaluation (Addendum)
Occupational Therapy Evaluation Patient Details Name: [name] MRN: [id number] DOB: [birth year] Today's Date: 04/04/2015    History of Present Illness [name] is a 69 y.o. male with multiple medical problems who presented to ED yesterday with weakness. He was diagnosed with C-diff by [facility] two months ago. Patient was apparently treated first with flagyl followed by Vancomycin. He reports completion of the antibiotics without resolution of diarrhea. C-diff still positive. Patient also found to have markedly elevated LFT. He denies previous history of liver problems. CT scan suggests cirrhosis as well as cholelithiasis without CBD dilation   Clinical Impression   Pt admitted with diarrhea. Pt currently with functional limitations due to the deficits listed below (see OT Problem List).  Pt will benefit from skilled OT to increase their safety and independence with ADL and functional mobility for ADL to facilitate discharge to venue listed below.      Follow Up Recommendations  SNF    Equipment Recommendations  None recommended by OT    Recommendations for Other Services       Precautions / Restrictions Precautions Precautions: Fall      Mobility Bed Mobility Overal bed mobility: Needs Assistance Bed Mobility: Rolling Rolling: Mod assist            Transfers                 General transfer comment: did not perform         ADL Overall ADL's : Needs assistance/impaired     Grooming: Minimal assistance;Bed level                   Toilet Transfer: +2 for physical assistance;Moderate assistance- no transfer performed. Rolling for toileting Toilet Transfer Details (indicate cue type and reason): rolling in bed as pt had diarrhea in bed/ mod A  for rolling    Total A for hygiene and clothing management/ mod a rolling for hygiene.    General ADL Comments: pt has been in bed for 2 monthes per wife.  Pt has been given bed baths and had BM in bed  and been changed by rolling.                 Pertinent Vitals/Pain Pain Assessment: No/denies pain     Hand Dominance Right   Extremity/Trunk Assessment Upper Extremity Assessment Upper Extremity Assessment: Generalized weakness              Cognition Arousal/Alertness: Awake/alert Behavior During Therapy: Flat affect Overall Cognitive Status: Within Functional Limits for tasks assessed                     General Comments    wife report pt has not stood up in 2 monthes           Home Living Family/patient expects to be discharged to:: Other (Comment) (or [facility] [place] [facility] Living Arrangements: Spouse/significant other                                      Prior Functioning/Environment Level of Independence: Needs assistance             OT Diagnosis: Generalized weakness   OT Problem List: Decreased strength;Decreased activity tolerance   OT Treatment/Interventions: Self-care/ADL training;DME and/or AE instruction;Patient/family education    OT Goals(Current goals can be found in the care plan section) Acute Rehab  OT Goals Patient Stated Goal: get stronger  OT Goal Formulation: With patient/family Time For Goal Achievement: 04/18/15 Potential to Achieve Goals: Good ADL Goals Pt Will Perform Eating: with min assist;sitting Pt Will Perform Grooming: with min assist;sitting Pt Will Transfer to Toilet: with max assist;bedside commode  OT Frequency: Min 2X/week   Barriers to D/C: Decreased caregiver support             End of Session Nurse Communication: Mobility status  Activity Tolerance: Patient limited by fatigue Patient left: in bed;with call bell/phone within reach;with family/visitor present;with bed alarm set   Time: [id number] OT Time Calculation (min): 26 min Charges:  OT General Charges $OT Visit: 1 Procedure OT Evaluation $Initial OT Evaluation Tier I: 1 Procedure OT Treatments $Self Care/Home  Management : 8-22 mins G-Codes:    Einar CrowEDDING, Hiroyuki Ozanich D 04/04/2015, 9:54 AM

## 2015-04-04 NOTE — Progress Notes (Signed)
PROGRESS NOTE  [name] BJY:[id number] DOB: [birth year] DOA: 04/02/2015 PCP: No primary care provider on file.  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 Acute hepatitis panel and tylenol levels has been ordered. Patient probably will need MRCP but cannot do because of pacemaker - defer to gastroenterology  -Follow LFTs.  History of DVT on Coumadin - since patient is anticipated to have procedures I have held Coumadin and place patient on heparin. Type and screen.  Diabetes mellitus type 2 uncontrolled - -lantus/SSI  History of stroke on aspirin.  Hypothyroidism Synthroid.  Chronic kidney disease stage III with mild hypokalemia- creatinine appears to be at baseline. Replace potassium and follow metabolic panel.   History of pacemaker placement.  Code Status: full Family Communication: sisters at bedside Disposition Plan:    Consultants:    Procedures:      HPI/Subjective: Sitting up with PT- slow to respond  Objective: Filed Vitals:   04/04/15 0637  BP: 161/86  Pulse: 78  Temp: 97.9 F (36.6 C)  Resp: 18    Intake/Output Summary (Last 24 hours) at 04/04/15 1217 Last data filed at 04/04/15 0865  Gross per 24 hour  Intake 1741.25 ml  Output     10 ml  Net 1731.25 ml   Filed Weights   04/02/15 2103  Weight: 117.935 kg (260 lb)    Exam:   General:  Awake, slow to respond  Cardiovascular: rrr  Respiratory: no wheezing  Abdomen: +BS, soft, NT   Data Reviewed: Basic Metabolic Panel:  Recent Labs Lab 04/02/15 2119 04/03/15 0650 04/04/15 0512  NA 136 138 136  K 3.4* 3.0* 3.4*  CL 100* 101 102  CO2 GLUCOSE 140* 120* 139*  BUN 22* 22* 19  CREATININE  1.49* 1.33* 1.30*  CALCIUM 8.9 8.8* 8.7*   Liver Function Tests:  Recent Labs Lab 04/02/15 2119 04/03/15 0650 04/04/15 0512  AST 780* 795* 744*  ALT 560* 547* 508*  ALKPHOS 651* 601* 668*  BILITOT 0.9 1.1 1.1  PROT 7.1 6.9 6.8  ALBUMIN 2.6* 2.5* 2.3*    Recent Labs Lab 04/02/15 2119  LIPASE 15*   No results for input(s): AMMONIA in the last 168 hours. CBC:  Recent Labs Lab 04/02/15 2119 04/03/15 0650  WBC 5.7 6.6  NEUTROABS 2.6 2.8  HGB 13.7 13.1  HCT 41.1 40.1  MCV 86.0 85.5  PLT 166 167   Cardiac Enzymes:  Recent Labs Lab 04/03/15 0650 04/04/15 1123  CKTOTAL 45*  --   TROPONINI 0.41* 0.40*   BNP (last 3 results) No results for input(s): BNP in the last 8760 hours.  ProBNP (last 3 results) No results for input(s): PROBNP in the last 8760 hours.  CBG:  Recent Labs Lab 04/03/15 0748 04/03/15 1227 04/03/15 1646 04/03/15 2134 04/04/15 0919  GLUCAP 116* 136* 198* 218* 113*    Recent Results (from the past 240 hour(s))  Clostridium Difficile by PCR     Status: Abnormal   Collection Time: 04/03/15  2:19 AM  Result Value Ref Range Status   C difficile by pcr POSITIVE (A) NEGATIVE Final    Comment: CRITICAL RESULT CALLED TO, READ BACK BY AND VERIFIED WITH: Raul Del RN 9:40 04/03/15 ([email or web address]) Performed at [facility]  Studies: Nm Hepatobiliary Liver Func  04/04/2015   CLINICAL DATA:  69 year old male with history of gallstones. Total bilirubin 1.1.  EXAM: NUCLEAR MEDICINE HEPATOBILIARY IMAGING  TECHNIQUE: Sequential images of the abdomen were obtained out to 60 minutes following intravenous administration of radiopharmaceutical.  RADIOPHARMACEUTICALS:  5.5 mCi Tc-4760m Choletec IV  COMPARISON:  CT of the abdomen and pelvis 04/03/2015.  FINDINGS: There is rapid uptake of radiotracer within the hepatic parenchyma. Some gallbladder activity is noted by 10 minutes. Small bowel activity is noted by 15 minutes. Continued accumulation of  radiotracer in the hepatic parenchyma and gallbladder, with continued excretion into the small bowel is observed throughout the examination.  IMPRESSION: 1. Normal examination. No findings to suggest cholecystitis or biliary tract obstruction.   Electronically Signed   By: Trudie Reedaniel  Entrikin M.D.   On: 04/04/2015 09:06   Ct Abdomen Pelvis W Contrast  04/03/2015   CLINICAL DATA:  Diarrhea. Recent diagnosis of C diff colitis. Weakness.  EXAM: CT ABDOMEN AND PELVIS WITH CONTRAST  TECHNIQUE: Multidetector CT imaging of the abdomen and pelvis was performed using the standard protocol following bolus administration of intravenous contrast.  CONTRAST:  100mL OMNIPAQUE IOHEXOL 300 MG/ML  SOLN  COMPARISON:  None available.  FINDINGS: Hypoventilatory change at the lung bases. The heart is enlarged. Pacemaker partially included.  Decreased hepatic density with lobular contours and diminished size the left hepatic lobe, suggestive of cirrhosis. No focal hepatic lesion. There is a periportal lymph node measuring 1 cm in short axis dimension. Calcified gallstones within physiologically distended gallbladder. No biliary dilatation. Small hypodensity in the anterior spleen, likely hemangioma. Spleen is normal in size. The adrenal glands are normal. Mild pancreatic atrophy. There are multiple hypodensities scattered throughout the pancreatic parenchyma. The pancreatic head this measures 1.5 cm, in the distal body 0.9 cm, and in the tail 0.9 cm. Kidneys demonstrate symmetric enhancement and excretion. Small subcentimeter bilateral renal cysts. Within the upper left kidney is a 1.4 cm lesion measuring soft tissue density, Hounsfield units of 38.  Small amount contrast of the distal esophagus. Stomach is decompressed. There are no dilated or thickened bowel loops. Midline ventral hernia contains normal appearing loops of small bowel. There is minimal colonic wall thickening involving the sigmoid colon without significant surrounding  pericolonic inflammatory change. The remainder the colon is decompressed. Distal colonic diverticulosis without diverticulitis. The appendix is normal. No free air, free fluid, or intra-abdominal fluid collection.  No retroperitoneal adenopathy. Abdominal aorta is normal in caliber. Moderate atherosclerosis without aneurysm.  Within the pelvis the urinary bladder is distended, air in the bladder is likely related to instrumentation. Prostate gland appears prominent. There is no pelvic free fluid. No pelvic adenopathy. Probable post left inguinal hernia repair.  There are no acute or suspicious osseous abnormalities. Cortical irregularity about the right anterior iliac crest suggestive of remote prior injury. There scattered bone islands.  IMPRESSION: 1. Minimal colonic wall thickening involving the sigmoid colon, may reflect mild colitis. 2. Ventral abdominal wall hernia containing normal appearing loops of small bowel without bowel compromise or obstruction. 3. Nodular hepatic contours, suggestive of underlying cirrhosis. No focal hepatic lesion. 4. Small round hypodensities within the pancreas, largest measuring 1.5 cm. 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 In the absence of prior imaging, nonemergent MRCP could be considered if patient is able tolerate breath hold technique. 5. Cholelithiasis without cholecystitis. 6. Bilateral renal cysts. In addition there is a left upper pole 1.5  cm lesion measuring higher than simple fluid density. This may reflect a complex or hemorrhagic cyst. Correlation with any prior imaging would be most helpful. In the absence of prior imaging, correlation with contrast-enhanced MRI is recommended.   Electronically Signed   By: Rubye Oaks M.D.   On: 04/03/2015 01:38    Scheduled Meds: . aspirin  81 mg Oral Daily  . brimonidine  1 drop Left Eye BID  . budesonide-formoterol  2 puff Inhalation BID  . insulin aspart  0-9  Units Subcutaneous TID WC  . insulin glargine  10 Units Subcutaneous QHS  . levothyroxine  200 mcg Oral QAC breakfast  . loratadine  10 mg Oral Daily  . metoprolol succinate  25 mg Oral Daily  . potassium chloride  40 mEq Oral BID  . saccharomyces boulardii  250 mg Oral BID  . sertraline  200 mg Oral Daily  . vancomycin  125 mg Oral 4 times per day   Continuous Infusions:  Antibiotics Given (last 72 hours)    Date/Time Action Medication Dose   04/03/15 0850 Given   vancomycin (VANCOCIN) 50 mg/mL oral solution 125 mg 125 mg   04/03/15 1532 Given   vancomycin (VANCOCIN) 50 mg/mL oral solution 125 mg 125 mg   04/03/15 1946 Given   vancomycin (VANCOCIN) 50 mg/mL oral solution 125 mg 125 mg   04/04/15 0045 Given   vancomycin (VANCOCIN) 50 mg/mL oral solution 125 mg 125 mg   04/04/15 4098 Given   vancomycin (VANCOCIN) 50 mg/mL oral solution 125 mg 125 mg      Principal Problem:   Weakness Active Problems:   Elevated liver function tests   Diarrhea   Diabetes mellitus type 2, uncontrolled   Hypothyroidism   Essential hypertension   Gallstones   CKD (chronic kidney disease) stage 3, GFR 30-59 ml/min    Time spent: 25 min    Fadumo Heng  [facility] Pager [phone]. If 7PM-7AM, please contact night-coverage at [email or web address], password [facility] 04/04/2015, 12:17 PM  LOS: 1 day

## 2015-04-05 ENCOUNTER — Inpatient Hospital Stay (HOSPITAL_COMMUNITY): Payer: Medicare Other

## 2015-04-05 DIAGNOSIS — R55 Syncope and collapse: Secondary | ICD-10-CM

## 2015-04-05 LAB — COMPREHENSIVE METABOLIC PANEL
ALT: 558 U/L — AB (ref 17–63)
AST: 829 U/L — ABNORMAL HIGH (ref 15–41)
Albumin: 2.5 g/dL — ABNORMAL LOW (ref 3.5–5.0)
Alkaline Phosphatase: 740 U/L — ABNORMAL HIGH (ref 38–126)
Anion gap: 8 (ref 5–15)
BUN: 30 mg/dL — AB (ref 6–20)
CO2: 24 mmol/L (ref 22–32)
CREATININE: 1.66 mg/dL — AB (ref 0.61–1.24)
Calcium: 9 mg/dL (ref 8.9–10.3)
Chloride: 103 mmol/L (ref 101–111)
GFR calc Af Amer: 46 mL/min — ABNORMAL LOW (ref >60)
GFR calc non Af Amer: 40 mL/min — ABNORMAL LOW (ref >60)
GLUCOSE: 127 mg/dL — AB (ref 65–99)
Potassium: 4.1 mmol/L (ref 3.5–5.1)
Sodium: 135 mmol/L (ref 135–145)
Total Bilirubin: 1.6 mg/dL — ABNORMAL HIGH (ref 0.3–1.2)
Total Protein: 7.1 g/dL (ref 6.5–8.1)

## 2015-04-05 LAB — PROTIME-INR
INR: 3.06 — ABNORMAL HIGH (ref 0.00–1.49)
INR: 3.36 — ABNORMAL HIGH (ref 0.00–1.49)
PROTHROMBIN TIME: 31.1 s — AB (ref 11.6–15.2)
Prothrombin Time: 33.3 seconds — ABNORMAL HIGH (ref 11.6–15.2)

## 2015-04-05 LAB — GLUCOSE, CAPILLARY
GLUCOSE-CAPILLARY: 106 mg/dL — AB (ref 65–99)
GLUCOSE-CAPILLARY: 106 mg/dL — AB (ref 65–99)
GLUCOSE-CAPILLARY: 141 mg/dL — AB (ref 65–99)
Glucose-Capillary: 193 mg/dL — ABNORMAL HIGH (ref 65–99)

## 2015-04-05 MED ORDER — SODIUM CHLORIDE 0.9 % IV SOLN
INTRAVENOUS | Status: DC
  Administered 2015-04-05: 18:00:00 via INTRAVENOUS
  Administered 2015-04-06: 50 mL/h via INTRAVENOUS

## 2015-04-05 MED ORDER — PHYTONADIONE 5 MG PO TABS
5.0000 mg | ORAL_TABLET | Freq: Once | ORAL | Status: AC
  Administered 2015-04-05: 5 mg via ORAL
  Filled 2015-04-05: qty 1

## 2015-04-05 MED ORDER — VITAMIN K1 10 MG/ML IJ SOLN: 3.0000 mg | Freq: Once | INTRAVENOUS | Status: DC

## 2015-04-05 MED ORDER — VITAMIN K1 10 MG/ML IJ SOLN
5.0000 mg | Freq: Once | INTRAVENOUS | Status: AC
  Administered 2015-04-05: 5 mg via INTRAVENOUS
  Filled 2015-04-05: qty 0.5

## 2015-04-05 MED ORDER — PHYTONADIONE 5 MG PO TABS
10.0000 mg | ORAL_TABLET | Freq: Once | ORAL | Status: DC
  Filled 2015-04-05: qty 2

## 2015-04-05 MED ORDER — SODIUM CHLORIDE 0.9 % IV SOLN: Freq: Once | INTRAVENOUS | Status: DC

## 2015-04-05 NOTE — Progress Notes (Signed)
Echocardiogram 2D Echocardiogram has been performed.  Dorothey BasemanReel, Quavon Keisling M 04/05/2015, 10:09 AM

## 2015-04-05 NOTE — Clinical Social Work Note (Signed)
Clinical Social Work Assessment  Patient Details  Name: [name] MRN: [id number] Date of Birth: 1946-09-12  Date of referral:  04/04/15               Reason for consult:  Facility Placement, Discharge Planning                Permission sought to share information with:    Permission granted to share information::     Name::        Agency::     Relationship::     Contact Information:     Housing/Transportation Living arrangements for the past 2 months:  Single Family Home Source of Information:  Spouse Patient Interpreter Needed:  None Criminal Activity/Legal Involvement Pertinent to Current Situation/Hospitalization:  No - Comment as needed Significant Relationships:    Lives with:  Spouse, Relatives Do you feel safe going back to the place where you live?   (SNF placement is needed.) Need for family participation in patient care:     Care giving concerns:  Pt's care cannot be managed at home following hospital d/c.   Social Worker assessment / plan: Pt hospitalized on 04/02/15 with weakness and c-diff. PT has recommended ST rehab following hospital d/c. Pt / spouse are in agreement with this plan. SNF search has been initiated and bed offers provided. [facility] CSW will continue to follow to assist with d/c planning to SNF.  Employment status:    Insurance information:    PT Recommendations:  Skilled Nursing Facility Information / Referral to community resources:  Skilled Nursing Facility  Patient/Family's Response to care:  Pt / spouse feel rehab is needed.  Patient/Family's Understanding of and Emotional Response to Diagnosis, Current Treatment, and Prognosis:  Spouse had [facility]. Spouse is aware and [place].   Emotional Assessment Appearance:  Appears stated age Attitude/Demeanor/Rapport:  Other (cooperative) Affect  (typically observed):  Calm Orientation:  Oriented to Self, Oriented to Place, Oriented to  Time Alcohol / Substance use:  Not Applicable Psych involvement (Current and /or in the community):  No (Comment)  Discharge Needs  Concerns to be addressed:  Discharge Planning Concerns Readmission within the last 30 days:  No Current discharge risk:  None Barriers to Discharge:  No Barriers Identified   Mikeisha Lemonds, Dickey GaveJamie Lee, LCSW 04/05/2015, 4:38 PM

## 2015-04-05 NOTE — Progress Notes (Signed)
Pt.no urine output for more than 8 hours,bladder scanner shows 413cc of urine. I&O cath. 400cc output. Pt.tolerate the procedure and we will continue to monitor.

## 2015-04-05 NOTE — Care Management Note (Signed)
Case Management Note  Patient Details  Name: [name] MRN: [id number] Date of Birth: 12/18/1945  Subjective/Objective:                   Chief Complaint: Weakness. Action/Plan: Discharge planning Expected Discharge Date:  04/08/15               Expected Discharge Plan:  [place]  In-House Referral:  Clinical Social Work  Discharge planning Services  CM Consult  Post Acute Care Choice:    Choice offered to:     DME Arranged:    DME Agency:     HH Arranged:    [place] Agency:     Status of Service:  Completed, signed off  Medicare Important Message Given:    Date Medicare IM Given:    Medicare IM give by:    Date Additional Medicare IM Given:    Additional Medicare Important Message give by:     If discussed at [place] of Stay Meetings, dates discussed:    Additional Comments: CM received call from [place] of the [facility] [phone] requesting CM obtain a refusal of Transfer from pt if pt chooses to stay at [facility] for treatment.  CM met with pt and extended family in room to discuss pt's desire to transfer to [facility] for treatment and found pt not able to sign or discuss desire or refusal; family suggested I call wife of pt, [name].  CM received a nod of approval from pt and CM called [name] who states she is coming to the hospital.  When [name] arrived, CM spoke with wife and CM explained the [place] is extending the option of transferring the pt to [facility] and [name] refuses and states she is happy with the treatment here at [facility].  CM asked of her plans after discharge and she states she needs to be physically close to husband and therefore is not interested in the [place] SNF and would like to have a SNF arranged within this community.  She requests [place] bc there is another family member at [place] but I explained the [place] needs to speak with her and [facility] may not be available.  She agrees to wait and talk with the CSW for SNF options.  CM gave [name] a copy  of the [facility] refusal of [facility] form, placed a copy in shadow chart and faxed original back to [place] at the after hours Fax: [phone].  CSW aware and following for disposition. Dellie Catholic, RN 04/05/2015, 3:41 PM

## 2015-04-05 NOTE — Progress Notes (Signed)
    Progress Note   Subjective  no complaints   Objective   Vital signs in last 24 hours: Temp:  [98.3 F (36.8 C)-98.4 F (36.9 C)] 98.4 F (36.9 C) (06/08 0556) Pulse Rate:  [74-79] 79 (06/08 0556) Resp:  [18] 18 (06/08 0556) BP: (151-161)/(90-94) 151/94 mmHg (06/08 0556) SpO2:  [96 %-100 %] 96 % (06/08 0556) Last BM Date: 04/03/15 General:    Black male in NAD Heart:  Regular rate and rhythm Abdomen:  Soft, nontender and nondistended. Normal bowel sounds. Extremities:  Without edema. Neurologic:  Alert and oriented,  grossly normal neurologically. Psych:  Cooperative. Normal mood and affect.     Lab Results:  Recent Labs  04/02/15 2119 04/03/15 0650  WBC 5.7 6.6  HGB 13.7 13.1  HCT 41.1 40.1  PLT 166 167   BMET  Recent Labs  04/02/15 2119 04/03/15 0650 04/04/15 0512  NA 136 138 136  K 3.4* 3.0* 3.4*  CL 100* 101 102  CO2 29 28 26   GLUCOSE 140* 120* 139*  BUN 22* 22* 19  CREATININE 1.49* 1.33* 1.30*  CALCIUM 8.9 8.8* 8.7*   LFT  Recent Labs  04/03/15 0650 04/04/15 0512  PROT 6.9 6.8  ALBUMIN 2.5* 2.3*  AST 795* 744*  ALT 547* 508*  ALKPHOS 601* 668*  BILITOT 1.1 1.1  BILIDIR 0.6*  --   IBILI 0.5  --    PT/INR  Recent Labs  04/04/15 0512 04/05/15 0617  LABPROT 34.7* 31.1*  INR 3.54* 3.06*      Assessment / Plan:   781. 69 year old male with abnormal LFTs.  CTscan suggests cirrhosis. He has cholelithiasis but no CBD dilation nor abdominal pain to suggest choledocholithiasis. 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 Plan is to reverse INR (I spoke with hospitalist) and get liver biopsy.  Unfortunately patient may need steroids which will complicate C-diff. Recheck INR in am.   2. Diarrhea, recently treated for C-diff. Diarrhea persistent, C. diff positive again. CTscan reveals minimal sigmoid colitis. On vancomycin.   3. Hx of dvt, coumadin on hold   4. Abnormal imaging of  pancreas. 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 Can't have MR secondary to pacemaker. I spoke with Dr. Wendall Papaan Jacobs about EUS. He agrees that eventual EUS (probably outpatient) is reasonable for evaluation of pancreatic lesions.   5. Elevated troponin x3. No chest pain   LOS: 2 days   [name]  04/05/2015, 10:01 AM      Attending physician's note   I have taken an interval history, reviewed the chart and examined the patient. I agree with the Advanced Practitioner's note, impression and recommendations. ANA and F-actin Ab significantly elevated suggesting autoimmune hepatitis. Liver biopsies per IR when INR has improved. Continue Vancomycin for C diff.   Venita LickMalcolm T. Russella DarStark, MD [facility]

## 2015-04-05 NOTE — Progress Notes (Signed)
ANTICOAGULATION CONSULT NOTE   Pharmacy Consult for Heparin (warfarin on hold for invasive procedures) Indication: Hx of DVT  No Known Allergies  Patient Measurements: Height:  (190.5 cm) Weight: 260 lb (117.935 kg) IBW/kg (Calculated) : 84.5   Vital Signs: Temp: 98.4 F (36.9 C) (06/08 0556) Temp Source: Oral (06/08 0556) BP: 151/94 mmHg (06/08 0556) Pulse Rate: 79 (06/08 0556)  Labs:  Recent Labs  04/02/15 2119  04/03/15 0650 04/03/15 1240 04/04/15 0512 04/04/15 1123 04/04/15 1655 04/04/15 2227 04/05/15 0617  HGB 13.7  --  13.1  --   --   --   --   --   --   HCT 41.1  --  40.1  --   --   --   --   --   --   PLT 166  --  167  --   --   --   --   --   --   APTT  --   --  75*  --   --   --   --   --   --   LABPROT  --   < >  --  33.0* 34.7*  --   --   --  31.1*  INR  --   < >  --  3.31* 3.54*  --   --   --  3.06*  CREATININE 1.49*  --  1.33*  --  1.30*  --   --   --   --   CKTOTAL  --   --  45*  --   --   --   --   --   --   TROPONINI  --   < > 0.41*  --   --  0.40* 0.38* 0.38*  --   < > = values in this interval not displayed.  Estimated Creatinine Clearance: 72.2 mL/min (by C-G formula based on Cr of 1.3).   Medical History: Past Medical History  Diagnosis Date  . Diabetes mellitus without complication   . Hypertension   . Presence of permanent cardiac pacemaker   . Hypothyroidism   . Stroke     Medications:  Scheduled:  . aspirin  81 mg Oral Daily  . brimonidine  1 drop Left Eye BID  . budesonide-formoterol  2 puff Inhalation BID  . insulin aspart  0-9 Units Subcutaneous TID WC  . insulin glargine  10 Units Subcutaneous QHS  . levothyroxine  200 mcg Oral QAC breakfast  . loratadine  10 mg Oral Daily  . metoprolol succinate  25 mg Oral Daily  . phytonadione  5 mg Oral Once  . saccharomyces boulardii  250 mg Oral BID  . sertraline  200 mg Oral Daily  . vancomycin  125 mg Oral 4 times per day   Infusions:    PRN: ondansetron **OR**  ondansetron (ZOFRAN) IV, polyvinyl alcohol  Assessment: 69 y/o M with PMH that includes but is not limited to stroke, DVT, and CDAD, on warfarin PTA with dosage reported as 5 mg PO daily except 7.5 mg Mondays and Wednesdays (last taken 6/5), admitted through ED 04/03/15 with generalized weakness in setting of severe persistent diarrhea, markedly elevated hepatic transaminases, and CT abd suggestive of mild colitis.  GI consult is pending.  Warfarin was held and pharmacy consult was requested for IV heparin.  INR supratherapeutic (3.09) on admission. No documented suspicion of any acute thromboembolic process.      Today,  04/05/2015  INR falling  but still supratherapeutic  Plans for liver biopsy noted - vitamin K 5 mg PO x 1 ordered by MD   Goal Range when heparin starts:  Heparin level 0.3 - 0.7 Monitor platelets by anticoagulation protocol: Yes   Plan: . 1. Recheck INR at 6pm 2. When INR <2, begin heparin infusion 3. Follow-up on timing of heparin stop / restart around liver biopsy.  Elie Goodyandy Niomie Englert, PharmD, BCPS Pager: [phone] 04/05/2015  10:25 AM   .

## 2015-04-05 NOTE — Progress Notes (Signed)
Patient ID: [name], male   DOB: [birth year], 69 y.o.   MRN: [id number] Aware of request for liver biopsy on pt. PT/INR 33.3/3.36 today. Will need INR less than or equal to 1.5 before proceeding with biopsy. Dr. Randol KernElgergawy aware.

## 2015-04-05 NOTE — Progress Notes (Addendum)
Pharmacy: Re- heparin   Patient's a 69 y.o M on coumadin PTA for hx DVT.  Coumadin currently on hold and reversing with vit K with plan for liver biopsy once INR is < 1.5.  INR now up to 3.36 with lab draw at  ~4PM --s/p vit K 5mg  PO given at noon today, an additional  Vit K 5 mg IV x1 ordered for tonight per MD.  Plan: - continue to hold off on starting heparin - recheck another INR at 11 PM tonight and will start heparin if INR <2.  Dorna LeitzAnh Luman Holway, PharmD, BCPS 04/05/2015 5:23 PM

## 2015-04-05 NOTE — Clinical Social Work Placement (Signed)
   CLINICAL SOCIAL WORK PLACEMENT  NOTE  Date:  04/05/2015  Patient Details  Name: [name] MRN: [id number] Date of Birth: Nov 14, 1945  Clinical Social Work is seeking post-discharge placement for this patient at the Skilled  Nursing Facility level of care (*CSW will initial, date and re-position this form in  chart as items are completed):  Yes   Patient/family provided with [place] Clinical Social Work Department's list of facilities offering this level of care within the geographic area requested by the patient (or if unable, by the patient's family).  Yes   Patient/family informed of their freedom to choose among providers that offer the needed level of care, that participate in Medicare, Medicaid or managed care program needed by the patient, have an available bed and are willing to accept the patient.  No   Patient/family informed of [facility]'s ownership interest in [facility] and [facility], as well as of the fact that they are under no obligation to receive care at these facilities.  PASRR submitted to EDS on 04/05/15     PASRR number received on 04/05/15     Existing PASRR number confirmed on       FL2 transmitted to all facilities in geographic area requested by pt/family on 04/05/15     FL2 transmitted to all facilities within larger geographic area on       Patient informed that his/her managed care company has contracts with or will negotiate with certain facilities, including the following:        Yes   Patient/family informed of bed offers received.  [facility]     Physician recommends and patient chooses bed at      [facility] on  .  Patient to be transferred to facility by       Patient family notified on   of transfer.  Name of family member notified:        PHYSICIAN       Additional Comment:    _______________________________________________ Royetta AsalHaidinger, Nickey Canedo Lee,  LCSW 04/05/2015, 4:44 PM

## 2015-04-05 NOTE — Progress Notes (Signed)
Patient Demographics  [name], is a 69 y.o. male, DOB - 01/12/1946, ZOX:[id number]  Admit date - 04/02/2015   Admitting Physician Eduard Clos, MD  Outpatient Primary MD for the patient is No primary care provider on file.  LOS - 2   Chief Complaint  Patient presents with  . Diarrhea       Admission HPI/Brief narrative:  69 year-old with diabetes mellitus, previous CVA, COPD, PPM, history of DVT on warfarin, resents with diarrhea, found to have C. 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  Subjective:   [name] today has, No headache, No chest pain, No abdominal pain - No Nausea, No new weakness tingling or numbness, No Cough - SOB. Reports only 2 bowel movements yesterday.  Assessment & Plan    Principal Problem:   Weakness Active Problems:   Elevated liver function tests   Diarrhea   Diabetes mellitus type 2, uncontrolled   Hypothyroidism   Essential hypertension   Gallstones   CKD (chronic kidney disease) stage 3, GFR 30-59 ml/min  C. difficile diarrhea - Improving on oral vancomycin, continue with probiotics.  Elevated LFTs -Elevated F-Actin IgG, ANA, workup suspicious for autoimmune hepatitis, IR consulted for CT-guided biopsy. - CT showing cholelithiasis but no CBD dilation no abdominal pain to suggest choledocholithiasis.normal HIDA scan. - Elevated INR, giving 5 mg of oral vitamin K.  Abnormal finding on imaging of pancreas - Patient will need EUS for further evaluation(possibly as an outpatient)  History of DVT - On warfarin, currently on hold, will bridge with heparin gtt once INR less than 2.  elevated trponins - denies any chest pain or shortness of breath, troponins plateaued 0.40>> 0.38>> 0.38 - 2-D echo showing EF 55%, with no wall motion  abnormality.  History of CVA - on aspirin  CKD - Baseline creatinine 1.3-1.5, today is 1.66, monitor closely.  Diabetes mellitus - Controlled on Lantus and insulin sliding scale  Code Status: Full  Family Communication: wife at bedside  Disposition Plan: pending PT work up.   Procedures  none   Consults   GI   Medications  Scheduled Meds: . aspirin  81 mg Oral Daily  . brimonidine  1 drop Left Eye BID  . budesonide-formoterol  2 puff Inhalation BID  . insulin aspart  0-9 Units Subcutaneous TID WC  . insulin glargine  10 Units Subcutaneous QHS  . levothyroxine  200 mcg Oral QAC breakfast  . loratadine  10 mg Oral Daily  . metoprolol succinate  25 mg Oral Daily  . saccharomyces boulardii  250 mg Oral BID  . sertraline  200 mg Oral Daily  . vancomycin  125 mg Oral 4 times per day   Continuous Infusions:  PRN Meds:.ondansetron **OR** ondansetron (ZOFRAN) IV, polyvinyl alcohol  DVT Prophylaxis  On warfarin  Lab Results  Component Value Date   PLT 167 04/03/2015    Antibiotics    Anti-infectives    Start     Dose/Rate Route Frequency Ordered Stop   04/03/15 0615  vancomycin (VANCOCIN) 50 mg/mL oral solution 125 mg     125 mg Oral 4 times per day 04/03/15 401 351 1689  Objective:   Filed Vitals:   04/04/15 1400 04/04/15 2048 04/04/15 2152 04/05/15 0556  BP: 155/92  161/90 151/94  Pulse: 74  79 79  Temp: 98.3 F (36.8 C)  98.3 F (36.8 C) 98.4 F (36.9 C)  TempSrc: Oral  Oral Oral  Resp: 18  18 18   Height:      Weight:      SpO2: 100% 96% 99% 96%    Wt Readings from Last 3 Encounters:  04/02/15 117.935 kg (260 lb)     Intake/Output Summary (Last 24 hours) at 04/05/15 1411 Last data filed at 04/05/15 1000  Gross per 24 hour  Intake    120 ml  Output    400 ml  Net   -280 ml     Physical Exam  Awake Alert, Oriented ,No new F.N deficits, Normal affect [place].AT,PERRAL Supple Neck,No JVD, No cervical lymphadenopathy appriciated.    Symmetrical Chest wall movement, Good air movement bilaterally. RRR,No Gallops,Rubs or new Murmurs, No Parasternal Heave +ve B.Sounds, Abd Soft, No tenderness, No organomegaly appriciated, No rebound - guarding or rigidity. No Cyanosis, Clubbing or edema, No new Rash or bruise     Data Review   Micro Results Recent Results (from the past 240 hour(s))  Clostridium Difficile by PCR     Status: Abnormal   Collection Time: 04/03/15  2:19 AM  Result Value Ref Range Status   C difficile by pcr POSITIVE (A) NEGATIVE Final    Comment: CRITICAL RESULT CALLED TO, READ BACK BY AND VERIFIED WITH: Raul DelS. WALTON RN 9:40 04/03/15 ([email or web address]) [facility]     Radiology Reports Nm Hepatobiliary Liver Func  04/04/2015   CLINICAL DATA:  69 year old male with history of gallstones. Total bilirubin 1.1.  EXAM: NUCLEAR MEDICINE HEPATOBILIARY IMAGING  TECHNIQUE: Sequential images of the abdomen were obtained out to 60 minutes following intravenous administration of radiopharmaceutical.  RADIOPHARMACEUTICALS:  5.5 mCi Tc-6077m Choletec IV  COMPARISON:  CT of the abdomen and pelvis 04/03/2015.  FINDINGS: There is rapid uptake of radiotracer within the hepatic parenchyma. Some gallbladder activity is noted by 10 minutes. Small bowel activity is noted by 15 minutes. Continued accumulation of radiotracer in the hepatic parenchyma and gallbladder, with continued excretion into the small bowel is observed throughout the examination.  IMPRESSION: 1. Normal examination. No findings to suggest cholecystitis or biliary tract obstruction.   Electronically Signed   By: Trudie Reedaniel  Entrikin M.D.   On: 04/04/2015 09:06   Ct Abdomen Pelvis W Contrast  04/03/2015   CLINICAL DATA:  Diarrhea. Recent diagnosis of C diff colitis. Weakness.  EXAM: CT ABDOMEN AND PELVIS WITH CONTRAST  TECHNIQUE: Multidetector CT imaging of the abdomen and pelvis was performed using the standard protocol following bolus administration of  intravenous contrast.  CONTRAST:  100mL OMNIPAQUE IOHEXOL 300 MG/ML  SOLN  COMPARISON:  None available.  FINDINGS: Hypoventilatory change at the lung bases. The heart is enlarged. Pacemaker partially included.  Decreased hepatic density with lobular contours and diminished size the left hepatic lobe, suggestive of cirrhosis. No focal hepatic lesion. There is a periportal lymph node measuring 1 cm in short axis dimension. Calcified gallstones within physiologically distended gallbladder. No biliary dilatation. Small hypodensity in the anterior spleen, likely hemangioma. Spleen is normal in size. The adrenal glands are normal. Mild pancreatic atrophy. There are multiple hypodensities scattered throughout the pancreatic parenchyma. The pancreatic head this measures 1.5 cm, in the distal body 0.9 cm, and in the tail 0.9 cm. Kidneys demonstrate  symmetric enhancement and excretion. Small subcentimeter bilateral renal cysts. Within the upper left kidney is a 1.4 cm lesion measuring soft tissue density, Hounsfield units of 38.  Small amount contrast of the distal esophagus. Stomach is decompressed. There are no dilated or thickened bowel loops. Midline ventral hernia contains normal appearing loops of small bowel. There is minimal colonic wall thickening involving the sigmoid colon without significant surrounding pericolonic inflammatory change. The remainder the colon is decompressed. Distal colonic diverticulosis without diverticulitis. The appendix is normal. No free air, free fluid, or intra-abdominal fluid collection.  No retroperitoneal adenopathy. Abdominal aorta is normal in caliber. Moderate atherosclerosis without aneurysm.  Within the pelvis the urinary bladder is distended, air in the bladder is likely related to instrumentation. Prostate gland appears prominent. There is no pelvic free fluid. No pelvic adenopathy. Probable post left inguinal hernia repair.  There are no acute or suspicious osseous  abnormalities. Cortical irregularity about the right anterior iliac crest suggestive of remote prior injury. There scattered bone islands.  IMPRESSION: 1. Minimal colonic wall thickening involving the sigmoid colon, may reflect mild colitis. 2. Ventral abdominal wall hernia containing normal appearing loops of small bowel without bowel compromise or obstruction. 3. Nodular hepatic contours, suggestive of underlying cirrhosis. No focal hepatic lesion. 4. Small round hypodensities within the pancreas, largest measuring 1.5 cm. 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 In the absence of prior imaging, nonemergent MRCP could be considered if patient is able tolerate breath hold technique. 5. Cholelithiasis without cholecystitis. 6. Bilateral renal cysts. In addition there is a left upper pole 1.5 cm lesion measuring higher than simple fluid density. This may reflect a complex or hemorrhagic cyst. Correlation with any prior imaging would be most helpful. In the absence of prior imaging, correlation with contrast-enhanced MRI is recommended.   Electronically Signed   By: Rubye Oaks M.D.   On: 04/03/2015 01:38     CBC  Recent Labs Lab 04/02/15 2119 04/03/15 0650  WBC 5.7 6.6  HGB 13.7 13.1  HCT 41.1 40.1  PLT 166 167  MCV 86.0 85.5  MCH 28.7 27.9  MCHC 33.3 32.7  RDW 17.2* 17.3*  LYMPHSABS 1.9 2.4  MONOABS 1.0 1.2*  EOSABS 0.2 0.2  BASOSABS 0.0 0.0    Chemistries   Recent Labs Lab 04/02/15 2119 04/03/15 0650 04/04/15 0512 04/05/15 1008  NA 136 138 136 135  K 3.4* 3.0* 3.4* 4.1  CL 100* 101 102 103  CO2 GLUCOSE 140* 120* 139* 127*  BUN 22* 22* 19 30*  CREATININE 1.49* 1.33* 1.30* 1.66*  CALCIUM 8.9 8.8* 8.7* 9.0  AST 780* 795* 744* 829*  ALT 560* 547* 508* 558*  ALKPHOS 651* 601* 668* 740*  BILITOT 0.9 1.1 1.1 1.6*    ------------------------------------------------------------------------------------------------------------------ estimated creatinine clearance is 56.5 mL/min (by C-G formula based on Cr of 1.66). ------------------------------------------------------------------------------------------------------------------ No results for input(s): HGBA1C in the last 72 hours. ------------------------------------------------------------------------------------------------------------------ No results for input(s): CHOL, HDL, LDLCALC, TRIG, CHOLHDL, LDLDIRECT in the last 72 hours. ------------------------------------------------------------------------------------------------------------------ No results for input(s): TSH, T4TOTAL, T3FREE, THYROIDAB in the last 72 hours.  Invalid input(s): FREET3 ------------------------------------------------------------------------------------------------------------------ No results for input(s): VITAMINB12, FOLATE, FERRITIN, TIBC, IRON, RETICCTPCT in the last 72 hours.  Coagulation profile  Recent Labs Lab 04/02/15 2312 04/03/15 1240 04/04/15 0512 04/05/15 0617  INR 3.09* 3.31* 3.54* 3.06*    No results for input(s): DDIMER in the last 72 hours.  Cardiac Enzymes  Recent Labs Lab 04/04/15 1123 04/04/15 1655 04/04/15 2227  TROPONINI 0.40* 0.38* 0.38*   ------------------------------------------------------------------------------------------------------------------ Invalid input(s): POCBNP     Time Spent in minutes   35 minutes   Travarus Trudo M.D on 04/05/2015 at 2:11 PM  Between 7am to 7pm - Pager - [phone]  After 7pm go to [email or web address] - password [email or web address]  [facility]   Office  [phone]

## 2015-04-06 ENCOUNTER — Inpatient Hospital Stay (HOSPITAL_COMMUNITY): Payer: Medicare Other

## 2015-04-06 LAB — BASIC METABOLIC PANEL
ANION GAP: 5 (ref 5–15)
BUN: 34 mg/dL — ABNORMAL HIGH (ref 6–20)
CHLORIDE: 100 mmol/L — AB (ref 101–111)
CO2: 27 mmol/L (ref 22–32)
CREATININE: 1.62 mg/dL — AB (ref 0.61–1.24)
Calcium: 8.8 mg/dL — ABNORMAL LOW (ref 8.9–10.3)
GFR calc Af Amer: 48 mL/min — ABNORMAL LOW (ref >60)
GFR, EST NON AFRICAN AMERICAN: 41 mL/min — AB (ref >60)
Glucose, Bld: 251 mg/dL — ABNORMAL HIGH (ref 65–99)
POTASSIUM: 3.8 mmol/L (ref 3.5–5.1)
SODIUM: 132 mmol/L — AB (ref 135–145)

## 2015-04-06 LAB — CBC
HCT: 36.1 % — ABNORMAL LOW (ref 39.0–52.0)
HEMOGLOBIN: 12 g/dL — AB (ref 13.0–17.0)
MCH: 27.8 pg (ref 26.0–34.0)
MCHC: 33.2 g/dL (ref 30.0–36.0)
MCV: 83.6 fL (ref 78.0–100.0)
Platelets: 148 10*3/uL — ABNORMAL LOW (ref 150–400)
RBC: 4.32 MIL/uL (ref 4.22–5.81)
RDW: 17.9 % — AB (ref 11.5–15.5)
WBC: 7 10*3/uL (ref 4.0–10.5)

## 2015-04-06 LAB — GLUCOSE, CAPILLARY
GLUCOSE-CAPILLARY: 182 mg/dL — AB (ref 65–99)
GLUCOSE-CAPILLARY: 160 mg/dL — AB (ref 65–99)
GLUCOSE-CAPILLARY: 157 mg/dL — AB (ref 65–99)
Glucose-Capillary: 132 mg/dL — ABNORMAL HIGH (ref 65–99)

## 2015-04-06 LAB — PREPARE FRESH FROZEN PLASMA
UNIT DIVISION: 0
UNIT DIVISION: 0

## 2015-04-06 LAB — PROTIME-INR
INR: 1.45 (ref 0.00–1.49)
Prothrombin Time: 17.7 seconds — ABNORMAL HIGH (ref 11.6–15.2)

## 2015-04-06 MED ORDER — SODIUM CHLORIDE 0.9 % IV SOLN: Freq: Once | INTRAVENOUS | Status: DC

## 2015-04-06 MED ORDER — HYDROCODONE-ACETAMINOPHEN 5-325 MG PO TABS: 1.0000 | ORAL_TABLET | ORAL | Status: DC | PRN

## 2015-04-06 MED ORDER — HEPARIN (PORCINE) IN NACL 100-0.45 UNIT/ML-% IJ SOLN
1500.0000 [IU]/h | INTRAMUSCULAR | Status: DC
  Administered 2015-04-06 – 2015-04-07 (×2): 1500 [IU]/h via INTRAVENOUS
  Filled 2015-04-06 (×3): qty 250

## 2015-04-06 MED ORDER — FENTANYL CITRATE (PF) 100 MCG/2ML IJ SOLN
INTRAMUSCULAR | Status: AC
  Filled 2015-04-06: qty 4

## 2015-04-06 MED ORDER — MIDAZOLAM HCL 2 MG/2ML IJ SOLN
INTRAMUSCULAR | Status: AC
  Filled 2015-04-06: qty 6

## 2015-04-06 MED ORDER — MIDAZOLAM HCL 2 MG/2ML IJ SOLN
INTRAMUSCULAR | Status: AC | PRN
  Administered 2015-04-06: 1 mg via INTRAVENOUS
  Administered 2015-04-06 (×2): 0.5 mg via INTRAVENOUS

## 2015-04-06 MED ORDER — FENTANYL CITRATE (PF) 100 MCG/2ML IJ SOLN
INTRAMUSCULAR | Status: AC | PRN
  Administered 2015-04-06: 25 ug via INTRAVENOUS

## 2015-04-06 MED ORDER — SODIUM CHLORIDE 0.9 % IV SOLN
INTRAVENOUS | Status: DC
  Administered 2015-04-06: 1000 mL via INTRAVENOUS

## 2015-04-06 NOTE — Progress Notes (Signed)
Patient ID: [name], male   DOB: [birth year], 69 y.o.   MRN: [id number]    Progress Note   Subjective  Sleepy, feels fine- "diarrhea about the same", denies abdominal pain. INR normalized   Objective   Vital signs in last 24 hours: Temp:  [97.7 F (36.5 C)-98.7 F (37.1 C)] 98 F (36.7 C) (06/09 0625) Pulse Rate:  [63-70] 66 (06/09 0625) Resp:  [18-20] 20 (06/09 0625) BP: (153-178)/(77-99) 176/86 mmHg (06/09 0625) SpO2:  [95 %-100 %] 100 % (06/09 0625) Last BM Date: 04/03/15 General:   Older AA male in NAD Heart:  Regular rate and rhythm; no murmurs Lungs: Respirations even and unlabored, lungs CTA bilaterally Abdomen: large  Soft, nontender and nondistended. Normal bowel sounds. Extremities:  Without edema. Neurologic:  Alert and oriented,  grossly normal neurologically. Psych:  Cooperative. Normal mood and affect.  Intake/Output from previous day: 06/08 0701 - 06/09 0700 In: 1949 [P.O.:1320; I.V.:100; Blood:529] Out: 2075 [Urine:2075] Intake/Output this shift:    Lab Results:  Recent Labs  04/06/15 0421  WBC 7.0  HGB 12.0*  HCT 36.1*  PLT 148*   BMET  Recent Labs  04/04/15 0512 04/05/15 1008 04/06/15 0421  NA 136 135 132*  K 3.4* 4.1 3.8  CL 102 103 100*  CO2 26 24 27   GLUCOSE 139* 127* 251*  BUN 19 30* 34*  CREATININE 1.30* 1.66* 1.62*  CALCIUM 8.7* 9.0 8.8*   LFT  Recent Labs  04/05/15 1008  PROT 7.1  ALBUMIN 2.5*  AST 829*  ALT 558*  ALKPHOS 740*  BILITOT 1.6*   PT/INR  Recent Labs  04/05/15 1540 04/06/15 0421  LABPROT 33.3* 17.7*  INR 3.36* 1.45    Studies/Results: Nm Hepatobiliary Liver Func  04/04/2015   CLINICAL DATA:  69 year old male with history of gallstones. Total bilirubin 1.1.  EXAM: NUCLEAR MEDICINE HEPATOBILIARY IMAGING  TECHNIQUE: Sequential images of the abdomen were obtained out to 60 minutes following intravenous administration of radiopharmaceutical.  RADIOPHARMACEUTICALS:  5.5 mCi Tc-84m Choletec IV   COMPARISON:  CT of the abdomen and pelvis 04/03/2015.  FINDINGS: There is rapid uptake of radiotracer within the hepatic parenchyma. Some gallbladder activity is noted by 10 minutes. Small bowel activity is noted by 15 minutes. Continued accumulation of radiotracer in the hepatic parenchyma and gallbladder, with continued excretion into the small bowel is observed throughout the examination.  IMPRESSION: 1. Normal examination. No findings to suggest cholecystitis or biliary tract obstruction.   Electronically Signed   By: Trudie Reed M.D.   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- needs liver Bx- hopefully this can be done today as INR normalized #3 AODM #4 CKD #5 HX DVT- off Coumadin  For now- he will need to be screened for varices before deciding about leaving on long term anticoagulation #6 abnormal pancreas on CT with small hypodensities- cannot r/o IPMN- plan is for out pt follow up and eventual EUS  Principal Problem:   Weakness Active Problems:   Elevated liver function tests   Diarrhea   Diabetes mellitus type 2, uncontrolled   Hypothyroidism   Essential hypertension   Gallstones   CKD (chronic kidney disease) stage 3, GFR 30-59 ml/min    LOS: 3 days   Amy [name]  04/06/2015, 8:38 AM      Attending physician's note   I have taken  an interval history, reviewed the chart and examined the patient. I agree with the Advanced Practitioner's note, impression and recommendations. 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 INR has normalized. Awaiting liver biopsy by IR. EGD to screen for varices tomorrow. Pancreatic lesions to be evaluated as outpatient. Pt receives his care at the [facility] and may want to continue to have his follow up there.  Venita Lick. Russella Dar, MD [facility]

## 2015-04-06 NOTE — Procedures (Signed)
US liver core bx 18g x3 to surg path No complication No blood loss. See complete dictation in [facility] PACS.  

## 2015-04-06 NOTE — Progress Notes (Signed)
ANTICOAGULATION CONSULT NOTE   Pharmacy Consult for Heparin (warfarin on hold for invasive procedures) Indication: Hx of DVT  No Known Allergies  Patient Measurements: Height:  (190.5 cm) Weight: 260 lb (117.935 kg) IBW/kg (Calculated) : 84.5   Vital Signs: Temp: 97.9 F (36.6 C) (06/09 0231) Temp Source: Oral (06/09 0231) BP: 176/88 mmHg (06/09 0231) Pulse Rate: 64 (06/09 0231)  Labs:  Recent Labs  04/03/15 0650  04/04/15 0512 04/04/15 1123 04/04/15 1655 04/04/15 2227 04/05/15 0617 04/05/15 1008 04/05/15 1540 04/06/15 0421  HGB 13.1  --   --   --   --   --   --   --   --  12.0*  HCT 40.1  --   --   --   --   --   --   --   --  36.1*  PLT 167  --   --   --   --   --   --   --   --  148*  APTT 75*  --   --   --   --   --   --   --   --   --   LABPROT  --   < > 34.7*  --   --   --  31.1*  --  33.3* 17.7*  INR  --   < > 3.54*  --   --   --  3.06*  --  3.36* 1.45  CREATININE 1.33*  --  1.30*  --   --   --   --  1.66*  --  1.62*  CKTOTAL 45*  --   --   --   --   --   --   --   --   --   TROPONINI 0.41*  --   --  0.40* 0.38* 0.38*  --   --   --   --   < > = values in this interval not displayed.  Estimated Creatinine Clearance: 57.9 mL/min (by C-G formula based on Cr of 1.62).   Medical History: Past Medical History  Diagnosis Date  . Diabetes mellitus without complication   . Hypertension   . Presence of permanent cardiac pacemaker   . Hypothyroidism   . Stroke     Medications:  Scheduled:  . sodium chloride   Intravenous Once  . sodium chloride   Intravenous Once  . aspirin  81 mg Oral Daily  . brimonidine  1 drop Left Eye BID  . budesonide-formoterol  2 puff Inhalation BID  . insulin aspart  0-9 Units Subcutaneous TID WC  . insulin glargine  10 Units Subcutaneous QHS  . levothyroxine  200 mcg Oral QAC breakfast  . loratadine  10 mg Oral Daily  . metoprolol succinate  25 mg Oral Daily  . saccharomyces boulardii  250 mg Oral BID  . sertraline  200  mg Oral Daily  . vancomycin  125 mg Oral 4 times per day   Infusions:  . sodium chloride 50 mL/hr (04/06/15 0237)  . heparin 1,500 Units/hr (04/06/15 0524)   PRN: ondansetron **OR** ondansetron (ZOFRAN) IV, polyvinyl alcohol  Assessment: 69 y/o M with PMH that includes but is not limited to stroke, DVT, and CDAD, on warfarin PTA with dosage reported as 5 mg PO daily except 7.5 mg Mondays and Wednesdays (last taken 6/5), admitted through ED 04/03/15 with generalized weakness in setting of severe persistent diarrhea, markedly elevated hepatic transaminases, and CT abd suggestive  of mild colitis.  GI consult is pending.  Warfarin was held and pharmacy consult was requested for IV heparin.  INR supratherapeutic (3.09) on admission. No documented suspicion of any acute thromboembolic process.      Today,  04/06/2015  INR now < 2 after vitamin K x 2 doses (5mg  PO x 1 at 1213 6/8,  5 mg IV x 1 at 1801 6/8) and 2 units FFP.  Anticipate liver biopsy by IR today but according to RN the procedure time has not been finalized.   Goal Range:  Heparin level 0.3 - 0.7 Monitor platelets by anticoagulation protocol: Yes   Plan: . 1. Begin heparin IV infusion at 1500 units/hr  2. Will need interventional radiology to give orders on when to hold heparin preprocedure - discussed this with RN 3. If for some reason the procedure is delayed, will plan to check heparin level 8 hours after starting heparin 4. Post-procedure, follow-up on orders for timing of heparin restart. 5. When heparin restarted post-procedure check heparin level 8 hours later. 6. Daily heparin level, CBC 7. Post-procedure await word on when to resume warfarin.  Elie Goody, PharmD, BCPS Pager: [phone] 04/06/2015  5:51 AM   .

## 2015-04-06 NOTE — Consult Note (Signed)
Reason for consult: Liver biopsy  Referring Physician(s): Stark,M/Elgergawy  History of Present Illness: [name] is a 69 y.o. male with history of diabetes, previous stroke, chronic kidney disease, prior to pacemaker, history of DVT on Coumadin, hypothyroidism who was recently admitted with diarrhea and weakness. The patient was initially treated for  C. difficile approximately 2 months ago, however he continues to have diarrhea. His LFTs are also markedly elevated. CT of the abdomen and pelvis on 6/6 revealed findings suspicious for colitis as well as nodular hepatic contour suggestive of underlying cirrhosis but no focal hepatic lesion. There are small rounded hypodensities within the pancreas. Request has now been made for ultrasound-guided random liver biopsy.  Past Medical History  Diagnosis Date  . Diabetes mellitus without complication   . Hypertension   . Presence of permanent cardiac pacemaker   . Hypothyroidism   . Stroke     Past Surgical History  Procedure Laterality Date  . Insert / replace / remove pacemaker    . Tonsillectomy      Allergies: Review of patient's allergies indicates no known allergies.  Medications: Prior to Admission medications   Medication Sig Start Date End Date Taking? Authorizing Provider  acetaminophen (TYLENOL) 325 MG tablet Take 650 mg by mouth 2 (two) times daily.   Yes Historical Provider, MD  aspirin 81 MG tablet Take 81 mg by mouth daily.   Yes Historical Provider, MD  atorvastatin (LIPITOR) 80 MG tablet Take 80 mg by mouth at bedtime.   Yes Historical Provider, MD  brimonidine (ALPHAGAN) 0.2 % ophthalmic solution Place 1 drop into the left eye 2 (two) times daily.   Yes Historical Provider, MD  budesonide-formoterol (SYMBICORT) 160-4.5 MCG/ACT inhaler Inhale 2 puffs into the lungs 2 (two) times daily.   Yes Historical Provider, MD  cetirizine (ZYRTEC) 10 MG tablet Take 10 mg by mouth daily.   Yes Historical Provider, MD    cholecalciferol (VITAMIN D) 1000 UNITS tablet Take 2,000 Units by mouth daily.   Yes Historical Provider, MD  hydroxypropyl methylcellulose / hypromellose (ISOPTO TEARS / GONIOVISC) 2.5 % ophthalmic solution Place 1 drop into both eyes 4 (four) times daily as needed for dry eyes (dry eyes).   Yes Historical Provider, MD  levothyroxine (SYNTHROID, LEVOTHROID) 100 MCG tablet Take 200 mcg by mouth daily before breakfast.   Yes Historical Provider, MD  metoprolol succinate (TOPROL-XL) 50 MG 24 hr tablet Take 25 mg by mouth daily. Take with or immediately following a meal.   Yes Historical Provider, MD  sertraline (ZOLOFT) 100 MG tablet Take 200 mg by mouth daily.   Yes Historical Provider, MD  warfarin (COUMADIN) 5 MG tablet Take 5-7.5 mg by mouth daily. Take 1 tablet (5 mg) by mouth daily on Sun, Tues, Thurs, Fri, Sat and Take 1.5 tablets (7.5 mg) by mouth on Mon and Wed.   Yes Historical Provider, MD     Family History  Problem Relation Age of Onset  . Diabetes Mellitus II Sister   . CAD Sister   . Hypertension Sister     History   Social History  . Marital Status: Married    Spouse Name: N/A  . Number of Children: N/A  . Years of Education: N/A   Social History Main Topics  . Smoking status: Never Smoker   . Smokeless tobacco: Not on file  . Alcohol Use: No  . Drug Use: Not on file  . Sexual Activity: Not on file   Other Topics  Concern  . None   Social History Narrative       Review of Systems  see above  Vital Signs: BP 176/86 mmHg  Pulse 66  Temp(Src) 98 F (36.7 C) (Oral)  Resp 20  Ht  (1.905 m)  Wt 260 lb (117.935 kg)  BMI 32.50 kg/m2  SpO2 100%  Physical Exam patient awake, alert. Chest clear to auscultation bilaterally. Abdomen obese, positive bowel sounds, nontender; extremities without edema -some limitation of movement secondary to prior stroke  Mallampati Score:     Imaging: Nm Hepatobiliary Liver Func  04/04/2015   CLINICAL DATA:   69 year old male with history of gallstones. Total bilirubin 1.1.  EXAM: NUCLEAR MEDICINE HEPATOBILIARY IMAGING  TECHNIQUE: Sequential images of the abdomen were obtained out to 60 minutes following intravenous administration of radiopharmaceutical.  RADIOPHARMACEUTICALS:  5.5 mCi Tc-54m Choletec IV  COMPARISON:  CT of the abdomen and pelvis 04/03/2015.  FINDINGS: There is rapid uptake of radiotracer within the hepatic parenchyma. Some gallbladder activity is noted by 10 minutes. Small bowel activity is noted by 15 minutes. Continued accumulation of radiotracer in the hepatic parenchyma and gallbladder, with continued excretion into the small bowel is observed throughout the examination.  IMPRESSION: 1. Normal examination. No findings to suggest cholecystitis or biliary tract obstruction.   Electronically Signed   By: Trudie Reed M.D.   On: 04/04/2015 09:06   Ct Abdomen Pelvis W Contrast  04/03/2015   CLINICAL DATA:  Diarrhea. Recent diagnosis of C diff colitis. Weakness.  EXAM: CT ABDOMEN AND PELVIS WITH CONTRAST  TECHNIQUE: Multidetector CT imaging of the abdomen and pelvis was performed using the standard protocol following bolus administration of intravenous contrast.  CONTRAST:  OMNIPAQUE IOHEXOL 300 MG/ML  SOLN  COMPARISON:  None available.  FINDINGS: Hypoventilatory change at the lung bases. The heart is enlarged. Pacemaker partially included.  Decreased hepatic density with lobular contours and diminished size the left hepatic lobe, suggestive of cirrhosis. No focal hepatic lesion. There is a periportal lymph node measuring 1 cm in short axis dimension. Calcified gallstones within physiologically distended gallbladder. No biliary dilatation. Small hypodensity in the anterior spleen, likely hemangioma. Spleen is normal in size. The adrenal glands are normal. Mild pancreatic atrophy. There are multiple hypodensities scattered throughout the pancreatic parenchyma. The pancreatic head this measures  1.5 cm, in the distal body 0.9 cm, and in the tail 0.9 cm. Kidneys demonstrate symmetric enhancement and excretion. Small subcentimeter bilateral renal cysts. Within the upper left kidney is a 1.4 cm lesion measuring soft tissue density, Hounsfield units of 38.  Small amount contrast of the distal esophagus. Stomach is decompressed. There are no dilated or thickened bowel loops. Midline ventral hernia contains normal appearing loops of small bowel. There is minimal colonic wall thickening involving the sigmoid colon without significant surrounding pericolonic inflammatory change. The remainder the colon is decompressed. Distal colonic diverticulosis without diverticulitis. The appendix is normal. No free air, free fluid, or intra-abdominal fluid collection.  No retroperitoneal adenopathy. Abdominal aorta is normal in caliber. Moderate atherosclerosis without aneurysm.  Within the pelvis the urinary bladder is distended, air in the bladder is likely related to instrumentation. Prostate gland appears prominent. There is no pelvic free fluid. No pelvic adenopathy. Probable post left inguinal hernia repair.  There are no acute or suspicious osseous abnormalities. Cortical irregularity about the right anterior iliac crest suggestive of remote prior injury. There scattered bone islands.  IMPRESSION: 1. Minimal colonic wall thickening involving the sigmoid colon, may  reflect mild colitis. 2. Ventral abdominal wall hernia containing normal appearing loops of small bowel without bowel compromise or obstruction. 3. Nodular hepatic contours, suggestive of underlying cirrhosis. No focal hepatic lesion. 4. Small round hypodensities within the pancreas, largest measuring 1.5 cm. 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 In the absence of prior imaging, nonemergent MRCP could be considered if patient is able tolerate breath hold technique. 5. Cholelithiasis without cholecystitis. 6.  Bilateral renal cysts. In addition there is a left upper pole 1.5 cm lesion measuring higher than simple fluid density. This may reflect a complex or hemorrhagic cyst. Correlation with any prior imaging would be most helpful. In the absence of prior imaging, correlation with contrast-enhanced MRI is recommended.   Electronically Signed   By: Rubye Oaks M.D.   On: 04/03/2015 01:38    Labs:  CBC:  Recent Labs  04/02/15 2119 04/03/15 0650 04/06/15 0421  WBC 5.7 6.6 7.0  HGB 13.7 13.1 12.0*  HCT 41.1 40.1 36.1*  PLT 166 167 148*    COAGS:  Recent Labs  04/03/15 0650  04/04/15 0512 04/05/15 0617 04/05/15 1540 04/06/15 0421  INR  --   < > 3.54* 3.06* 3.36* 1.45  APTT 75*  --   --   --   --   --   < > = values in this interval not displayed.  BMP:  Recent Labs  04/03/15 0650 04/04/15 0512 04/05/15 1008 04/06/15 0421  NA 138 136 135 132*  K 3.0* 3.4* 4.1 3.8  CL 101 102 103 100*  CO2 28 26 24 27   GLUCOSE 120* 139* 127* 251*  BUN 22* 19 30* 34*  CALCIUM 8.8* 8.7* 9.0 8.8*  CREATININE 1.33* 1.30* 1.66* 1.62*  GFRNONAA 52* 54* 40* 41*  GFRAA >60 >60 46* 48*    LIVER FUNCTION TESTS:  Recent Labs  04/02/15 2119 04/03/15 0650 04/04/15 0512 04/05/15 1008  BILITOT 0.9 1.1 1.1 1.6*  AST 780* 795* 744* 829*  ALT 560* 547* 508* 558*  ALKPHOS 651* 601* 668* 740*  PROT 7.1 6.9 6.8 7.1  ALBUMIN 2.6* 2.5* 2.3* 2.5*    TUMOR MARKERS: No results for input(s): AFPTM, CEA, CA199, CHROMGRNA in the last 8760 hours.  Assessment and Plan: [name] is a 69 y.o. male with history of diabetes, previous stroke, chronic kidney disease, prior to pacemaker, history of DVT on Coumadin, hypothyroidism who was recently admitted with diarrhea and weakness. The patient was initially treated for  C. difficile approximately 2 months ago, however he continues to have diarrhea. His LFTs are also markedly elevated. CT of the abdomen and pelvis on 6/6 revealed findings suspicious for  colitis as well as nodular hepatic contour suggestive of underlying cirrhosis but no focal hepatic lesion. There are small rounded hypodensities within the pancreas. Request has now been made for ultrasound-guided random liver biopsy. Patient currently on IV heparin and this has been held for biopsy.Risks and benefits discussed with the patient/pt's sister in law including, but not limited to bleeding, infection, damage to adjacent structures or low yield requiring additional tests.All of the patient's questions were answered, patient is agreeable to proceed.Consent signed and in chart. Procedure scheduled for later today.        Signed: D. Jeananne Rama 04/06/2015, 9:58 AM   I spent a total of 20 minutes  in face to face in clinical consultation, greater than 50% of which was counseling/coordinating care for ultrasound-guided liver biopsy.

## 2015-04-06 NOTE — Progress Notes (Signed)
Patient Demographics  [name], is a 69 y.o. male, DOB - 26-Nov-1945, ZOX:[id number]  Admit date - 04/02/2015   Admitting Physician Eduard Clos, MD  Outpatient Primary MD for the patient is No primary care provider on file.  LOS - 3   Chief Complaint  Patient presents with  . Diarrhea       Admission HPI/Brief narrative:  69 year-old with diabetes mellitus, previous CVA, COPD, PPM, history of DVT on warfarin, resents with diarrhea, found to have C. 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  Subjective:   [name] today has, No headache, No chest pain, No abdominal pain - No Nausea, No new weakness tingling or numbness, No Cough - SOB. Reports only 2 bowel movements yesterday as well.  Assessment & Plan    Principal Problem:   Weakness Active Problems:   Elevated liver function tests   Diarrhea   Diabetes mellitus type 2, uncontrolled   Hypothyroidism   Essential hypertension   Gallstones   CKD (chronic kidney disease) stage 3, GFR 30-59 ml/min  C. difficile diarrhea - Improving on oral vancomycin, continue with probiotics.  Elevated LFTs -Elevated F-Actin IgG, ANA, workup suspicious for autoimmune hepatitis, IR consulted for CT-guided biopsy. - CT showing cholelithiasis but no CBD dilation no abdominal pain to suggest choledocholithiasis.normal HIDA scan. - INR corrected with Vitamin K, and FFP, INR is 1.45 today, IR will proceed with liver biopsy. - EGD to evaluate for varices tomorrow per GI.  Abnormal finding on imaging of pancreas - Patient will need EUS for further evaluation as outpatient.  History of DVT - on warfarin at home, currently on heparin drip.  elevated trponins - denies any chest pain or shortness of breath, troponins plateaued 0.40>> 0.38>>  0.38 - 2-D echo showing EF 55%, with no wall motion abnormality.  History of CVA - on aspirin  CKD - Baseline creatinine 1.3-1.5,   Diabetes mellitus - Controlled on Lantus and insulin sliding scale  Code Status: Full  Family Communication: none at bedside  Disposition Plan: pending PT work up.   Procedures  none   Consults   GI IR  Medications  Scheduled Meds: . sodium chloride   Intravenous Once  . sodium chloride   Intravenous Once  . aspirin  81 mg Oral Daily  . brimonidine  1 drop Left Eye BID  . budesonide-formoterol  2 puff Inhalation BID  . insulin aspart  0-9 Units Subcutaneous TID WC  . insulin glargine  10 Units Subcutaneous QHS  . levothyroxine  200 mcg Oral QAC breakfast  . loratadine  10 mg Oral Daily  . metoprolol succinate  25 mg Oral Daily  . saccharomyces boulardii  250 mg Oral BID  . sertraline  200 mg Oral Daily  . vancomycin  125 mg Oral 4 times per day   Continuous Infusions: . sodium chloride 50 mL/hr (04/06/15 0237)  . heparin Stopped (04/06/15 1025)   PRN Meds:.ondansetron **OR** ondansetron (ZOFRAN) IV, polyvinyl alcohol  DVT Prophylaxis  On warfarin  Lab Results  Component Value Date   PLT 148* 04/06/2015    Antibiotics    Anti-infectives    Start  Dose/Rate Route Frequency Ordered Stop   04/03/15 0615  vancomycin (VANCOCIN) 50 mg/mL oral solution 125 mg     125 mg Oral 4 times per day 04/03/15 0613            Objective:   Filed Vitals:   04/06/15 0032 04/06/15 0231 04/06/15 0625 04/06/15 1034  BP: 162/87 176/88 176/86 159/92  Pulse: 66 64 66 77  Temp: 98.7 F (37.1 C) 97.9 F (36.6 C) 98 F (36.7 C)   TempSrc: Oral Oral Oral   Resp: Height:      Weight:      SpO2: 95% 100% 100%     Wt Readings from Last 3 Encounters:  04/02/15 117.935 kg (260 lb)     Intake/Output Summary (Last 24 hours) at 04/06/15 1247 Last data filed at 04/06/15 0957  Gross per 24 hour  Intake   1949 ml  Output    2975 ml  Net  -1026 ml     Physical Exam  Awake Alert, Oriented ,No new F.N deficits, Normal affect Wing.AT,PERRAL Supple Neck,No JVD, No cervical lymphadenopathy appriciated.  Symmetrical Chest wall movement, Good air movement bilaterally. RRR,No Gallops,Rubs or new Murmurs, No Parasternal Heave +ve B.Sounds, Abd Soft, No tenderness, No organomegaly appriciated, No rebound - guarding or rigidity. No Cyanosis, Clubbing or edema, No new Rash or bruise     Data Review   Micro Results Recent Results (from the past 240 hour(s))  Clostridium Difficile by PCR     Status: Abnormal   Collection Time: 04/03/15  2:19 AM  Result Value Ref Range Status   C difficile by pcr POSITIVE (A) NEGATIVE Final    Comment: CRITICAL RESULT CALLED TO, READ BACK BY AND VERIFIED WITH: Raul Del RN 9:40 04/03/15 ([email or web address]) Performed at [facility]     Radiology Reports Nm Hepatobiliary Liver Func  04/04/2015   CLINICAL DATA:  69 year old male with history of gallstones. Total bilirubin 1.1.  EXAM: NUCLEAR MEDICINE HEPATOBILIARY IMAGING  TECHNIQUE: Sequential images of the abdomen were obtained out to 60 minutes following intravenous administration of radiopharmaceutical.  RADIOPHARMACEUTICALS:  5.5 mCi Tc-80m Choletec IV  COMPARISON:  CT of the abdomen and pelvis 04/03/2015.  FINDINGS: There is rapid uptake of radiotracer within the hepatic parenchyma. Some gallbladder activity is noted by 10 minutes. Small bowel activity is noted by 15 minutes. Continued accumulation of radiotracer in the hepatic parenchyma and gallbladder, with continued excretion into the small bowel is observed throughout the examination.  IMPRESSION: 1. Normal examination. No findings to suggest cholecystitis or biliary tract obstruction.   Electronically Signed   By: Trudie Reed M.D.   On: 04/04/2015 09:06   Ct Abdomen Pelvis W Contrast  04/03/2015   CLINICAL DATA:  Diarrhea. Recent diagnosis of C diff colitis. Weakness.  EXAM:  CT ABDOMEN AND PELVIS WITH CONTRAST  TECHNIQUE: Multidetector CT imaging of the abdomen and pelvis was performed using the standard protocol following bolus administration of intravenous contrast.  CONTRAST:  OMNIPAQUE IOHEXOL 300 MG/ML  SOLN  COMPARISON:  None available.  FINDINGS: Hypoventilatory change at the lung bases. The heart is enlarged. Pacemaker partially included.  Decreased hepatic density with lobular contours and diminished size the left hepatic lobe, suggestive of cirrhosis. No focal hepatic lesion. There is a periportal lymph node measuring 1 cm in short axis dimension. Calcified gallstones within physiologically distended gallbladder. No biliary dilatation. Small hypodensity in the anterior spleen, likely hemangioma. Spleen is normal in size.  The adrenal glands are normal. Mild pancreatic atrophy. There are multiple hypodensities scattered throughout the pancreatic parenchyma. The pancreatic head this measures 1.5 cm, in the distal body 0.9 cm, and in the tail 0.9 cm. Kidneys demonstrate symmetric enhancement and excretion. Small subcentimeter bilateral renal cysts. Within the upper left kidney is a 1.4 cm lesion measuring soft tissue density, Hounsfield units of 38.  Small amount contrast of the distal esophagus. Stomach is decompressed. There are no dilated or thickened bowel loops. Midline ventral hernia contains normal appearing loops of small bowel. There is minimal colonic wall thickening involving the sigmoid colon without significant surrounding pericolonic inflammatory change. The remainder the colon is decompressed. Distal colonic diverticulosis without diverticulitis. The appendix is normal. No free air, free fluid, or intra-abdominal fluid collection.  No retroperitoneal adenopathy. Abdominal aorta is normal in caliber. Moderate atherosclerosis without aneurysm.  Within the pelvis the urinary bladder is distended, air in the bladder is likely related to instrumentation. Prostate  gland appears prominent. There is no pelvic free fluid. No pelvic adenopathy. Probable post left inguinal hernia repair.  There are no acute or suspicious osseous abnormalities. Cortical irregularity about the right anterior iliac crest suggestive of remote prior injury. There scattered bone islands.  IMPRESSION: 1. Minimal colonic wall thickening involving the sigmoid colon, may reflect mild colitis. 2. Ventral abdominal wall hernia containing normal appearing loops of small bowel without bowel compromise or obstruction. 3. Nodular hepatic contours, suggestive of underlying cirrhosis. No focal hepatic lesion. 4. Small round hypodensities within the pancreas, largest measuring 1.5 cm. 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 In the absence of prior imaging, nonemergent MRCP could be considered if patient is able tolerate breath hold technique. 5. Cholelithiasis without cholecystitis. 6. Bilateral renal cysts. In addition there is a left upper pole 1.5 cm lesion measuring higher than simple fluid density. This may reflect a complex or hemorrhagic cyst. Correlation with any prior imaging would be most helpful. In the absence of prior imaging, correlation with contrast-enhanced MRI is recommended.   Electronically Signed   By: Rubye Oaks M.D.   On: 04/03/2015 01:38     CBC  Recent Labs Lab 04/02/15 2119 04/03/15 0650 04/06/15 0421  WBC 5.7 6.6 7.0  HGB 13.7 13.1 12.0*  HCT 41.1 40.1 36.1*  PLT 166 167 148*  MCV 86.0 85.5 83.6  MCH 28.7 27.9 27.8  MCHC 33.3 32.7 33.2  RDW 17.2* 17.3* 17.9*  LYMPHSABS 1.9 2.4  --   MONOABS 1.0 1.2*  --   EOSABS 0.2 0.2  --   BASOSABS 0.0 0.0  --     Chemistries   Recent Labs Lab 04/02/15 2119 04/03/15 0650 04/04/15 0512 04/05/15 1008 04/06/15 0421  NA 136 138 136 135 132*  K 3.4* 3.0* 3.4* 4.1 3.8  CL 100* 101 102 103 100*  CO2 GLUCOSE 140* 120* 139* 127* 251*  BUN 22* 22* 19 30*  34*  CREATININE 1.49* 1.33* 1.30* 1.66* 1.62*  CALCIUM 8.9 8.8* 8.7* 9.0 8.8*  AST 780* 795* 744* 829*  --   ALT 560* 547* 508* 558*  --   ALKPHOS 651* 601* 668* 740*  --   BILITOT 0.9 1.1 1.1 1.6*  --    ------------------------------------------------------------------------------------------------------------------ estimated creatinine clearance is 57.9 mL/min (by C-G formula based on Cr of 1.62). ------------------------------------------------------------------------------------------------------------------ No results for input(s): HGBA1C in the last 72 hours. ------------------------------------------------------------------------------------------------------------------ No results for input(s): CHOL,  HDL, LDLCALC, TRIG, CHOLHDL, LDLDIRECT in the last 72 hours. ------------------------------------------------------------------------------------------------------------------ No results for input(s): TSH, T4TOTAL, T3FREE, THYROIDAB in the last 72 hours.  Invalid input(s): FREET3 ------------------------------------------------------------------------------------------------------------------ No results for input(s): VITAMINB12, FOLATE, FERRITIN, TIBC, IRON, RETICCTPCT in the last 72 hours.  Coagulation profile  Recent Labs Lab 04/03/15 1240 04/04/15 0512 04/05/15 0617 04/05/15 1540 04/06/15 0421  INR 3.31* 3.54* 3.06* 3.36* 1.45    No results for input(s): DDIMER in the last 72 hours.  Cardiac Enzymes  Recent Labs Lab 04/04/15 1123 04/04/15 1655 04/04/15 2227  TROPONINI 0.40* 0.38* 0.38*   ------------------------------------------------------------------------------------------------------------------ Invalid input(s): POCBNP     Time Spent in minutes   30 minutes   ELGERGAWY, DAWOOD M.D on 04/06/2015 at 12:47 PM  Between 7am to 7pm - Pager - [phone]  After 7pm go to [email or web address] - password [facility]  [facility]   Office  [phone]

## 2015-04-07 ENCOUNTER — Encounter (HOSPITAL_COMMUNITY)
Admission: EM | Disposition: A | Discharge status: Skilled Nursing Facility | Payer: Medicare Other | Source: Home / Self Care | Attending: Internal Medicine

## 2015-04-07 ENCOUNTER — Encounter (HOSPITAL_COMMUNITY): Payer: Self-pay

## 2015-04-07 DIAGNOSIS — K759 Inflammatory liver disease, unspecified: Secondary | ICD-10-CM

## 2015-04-07 DIAGNOSIS — K7469 Other cirrhosis of liver: Secondary | ICD-10-CM

## 2015-04-07 DIAGNOSIS — K2900 Acute gastritis without bleeding: Secondary | ICD-10-CM

## 2015-04-07 DIAGNOSIS — K2960 Other gastritis without bleeding: Secondary | ICD-10-CM | POA: Insufficient documentation

## 2015-04-07 DIAGNOSIS — K2980 Duodenitis without bleeding: Secondary | ICD-10-CM

## 2015-04-07 HISTORY — PX: ESOPHAGOGASTRODUODENOSCOPY: SHX5428

## 2015-04-07 LAB — GLUCOSE, CAPILLARY
GLUCOSE-CAPILLARY: 174 mg/dL — AB (ref 65–99)
Glucose-Capillary: 115 mg/dL — ABNORMAL HIGH (ref 65–99)
Glucose-Capillary: 107 mg/dL — ABNORMAL HIGH (ref 65–99)
Glucose-Capillary: 130 mg/dL — ABNORMAL HIGH (ref 65–99)

## 2015-04-07 LAB — BASIC METABOLIC PANEL
Anion gap: 7 (ref 5–15)
BUN: 25 mg/dL — ABNORMAL HIGH (ref 6–20)
CHLORIDE: 102 mmol/L (ref 101–111)
CO2: 26 mmol/L (ref 22–32)
Calcium: 8.7 mg/dL — ABNORMAL LOW (ref 8.9–10.3)
Creatinine, Ser: 1.24 mg/dL (ref 0.61–1.24)
GFR calc Af Amer: >60 mL/min (ref >60)
GFR calc non Af Amer: 57 mL/min — ABNORMAL LOW (ref >60)
Glucose, Bld: 138 mg/dL — ABNORMAL HIGH (ref 65–99)
POTASSIUM: 3.5 mmol/L (ref 3.5–5.1)
SODIUM: 135 mmol/L (ref 135–145)

## 2015-04-07 LAB — CBC
HCT: 37.3 % — ABNORMAL LOW (ref 39.0–52.0)
HEMOGLOBIN: 12.4 g/dL — AB (ref 13.0–17.0)
MCH: 28.1 pg (ref 26.0–34.0)
MCHC: 33.2 g/dL (ref 30.0–36.0)
MCV: 84.6 fL (ref 78.0–100.0)
Platelets: 156 10*3/uL (ref 150–400)
RBC: 4.41 MIL/uL (ref 4.22–5.81)
RDW: 17.6 % — AB (ref 11.5–15.5)
WBC: 7.8 10*3/uL (ref 4.0–10.5)

## 2015-04-07 LAB — PROTIME-INR
INR: 1.31 (ref 0.00–1.49)
Prothrombin Time: 16.5 seconds — ABNORMAL HIGH (ref 11.6–15.2)

## 2015-04-07 LAB — HEPARIN LEVEL (UNFRACTIONATED)
Heparin Unfractionated: 0.65 IU/mL (ref 0.30–0.70)
Heparin Unfractionated: 0.82 IU/mL — ABNORMAL HIGH (ref 0.30–0.70)

## 2015-04-07 SURGERY — EGD (ESOPHAGOGASTRODUODENOSCOPY)
Anesthesia: Moderate Sedation

## 2015-04-07 MED ORDER — APIXABAN 5 MG PO TABS
5.0000 mg | ORAL_TABLET | Freq: Two times a day (BID) | ORAL | Status: DC
  Administered 2015-04-07 – 2015-04-08 (×3): 5 mg via ORAL
  Filled 2015-04-07 (×4): qty 1

## 2015-04-07 MED ORDER — TAMSULOSIN HCL 0.4 MG PO CAPS
0.4000 mg | ORAL_CAPSULE | Freq: Every day | ORAL | Status: DC
  Administered 2015-04-07 – 2015-04-08 (×2): 0.4 mg via ORAL
  Filled 2015-04-07 (×2): qty 1

## 2015-04-07 MED ORDER — MIDAZOLAM HCL 10 MG/2ML IJ SOLN
INTRAMUSCULAR | Status: DC | PRN
  Administered 2015-04-07: 2 mg via INTRAVENOUS

## 2015-04-07 MED ORDER — FENTANYL CITRATE (PF) 100 MCG/2ML IJ SOLN
INTRAMUSCULAR | Status: DC | PRN
  Administered 2015-04-07: 25 ug via INTRAVENOUS

## 2015-04-07 MED ORDER — BUTAMBEN-TETRACAINE-BENZOCAINE 2-2-14 % EX AERO
INHALATION_SPRAY | CUTANEOUS | Status: DC | PRN
  Administered 2015-04-07: 1 via TOPICAL

## 2015-04-07 MED ORDER — FENTANYL CITRATE (PF) 100 MCG/2ML IJ SOLN
INTRAMUSCULAR | Status: AC
  Filled 2015-04-07: qty 2

## 2015-04-07 MED ORDER — MIDAZOLAM HCL 5 MG/ML IJ SOLN
INTRAMUSCULAR | Status: AC
  Filled 2015-04-07: qty 2

## 2015-04-07 MED ORDER — PANTOPRAZOLE SODIUM 40 MG PO TBEC
40.0000 mg | DELAYED_RELEASE_TABLET | Freq: Every day | ORAL | Status: DC
  Administered 2015-04-07 – 2015-04-08 (×2): 40 mg via ORAL
  Filled 2015-04-07 (×3): qty 1

## 2015-04-07 MED ORDER — AMLODIPINE BESYLATE 10 MG PO TABS
10.0000 mg | ORAL_TABLET | Freq: Every day | ORAL | Status: DC
  Administered 2015-04-07 – 2015-04-08 (×2): 10 mg via ORAL
  Filled 2015-04-07 (×3): qty 1

## 2015-04-07 MED ORDER — DIPHENHYDRAMINE HCL 50 MG/ML IJ SOLN
INTRAMUSCULAR | Status: AC
  Filled 2015-04-07: qty 1

## 2015-04-07 NOTE — Interval H&P Note (Signed)
History and Physical Interval Note:  04/07/2015 8:37 AM  [name]  has presented today for surgery, with the diagnosis of cirrhosis -r/o varices  The various methods of treatment have been discussed with the patient and family. After consideration of risks, benefits and other options for treatment, the patient has consented to  Procedure(s): ESOPHAGOGASTRODUODENOSCOPY (EGD) (N/A) as a surgical intervention .  The patient's history has been reviewed, patient examined, no change in status, stable for surgery.  I have reviewed the patient's chart and labs.  Questions were answered to the patient's satisfaction.     Venita Lick. Russella Dar

## 2015-04-07 NOTE — Progress Notes (Signed)
ANTICOAGULATION CONSULT NOTE - Follow Up Consult  Pharmacy Consult for warfarin >> heparin >> apixaban Indication: Hx of DVT   No Known Allergies  Patient Measurements: Height: 6\' 3"  (190.5 cm) Weight: 260 lb (117.935 kg) IBW/kg (Calculated) : 84.5  Labs:  Recent Labs  04/04/15 1123 04/04/15 1655 04/04/15 2227  04/05/15 1008 04/05/15 1540 04/06/15 0421 04/07/15 0120  HGB  --   --   --   --   --   --  12.0* 12.4*  HCT  --   --   --   --   --   --  36.1* 37.3*  PLT  --   --   --   --   --   --  148* 156  LABPROT  --   --   --   < >  --  33.3* 17.7* 16.5*  INR  --   --   --   < >  --  3.36* 1.45 1.31  HEPARINUNFRC  --   --   --   --   --   --   --  0.65  CREATININE  --   --   --   --  1.66*  --  1.62* 1.24  TROPONINI 0.40* 0.38* 0.38*  --   --   --   --   --   < > = values in this interval not displayed.  Estimated Creatinine Clearance: 75.7 mL/min (by C-G formula based on Cr of 1.24).   Medications:  Infusions:  . sodium chloride 1,000 mL (04/06/15 2035)  . heparin 1,500 Units/hr (04/07/15 0453)    Assessment: 69 y/o M with PMH including stroke, DVT, and CDAD, on warfarin PTA with INR 3.09 on admission.  Presents with generalized weakness in setting of severe persistent diarrhea. He was diagnosed with C-diff by [facility] two months ago. He reports completion of the antibiotics without resolution of diarrhea, and C-diff still positive. Patient also found to have markedly elevated LFT.  CT scan suggests cirrhosis as well as cholelithiasis without CBD dilation.  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  Warfarin stopped and transitioned to heparin IV per pharmacy after INR < 2 in anticipation of liver biopsy.  To transition to Apixaban today for long-term anticoagulation.  Significant events: 6/8: received Vit K 5 mg PO + 5 mg IV; 2 units FFP 6/9: heparin held ~2 hrs for liver Bx.  Bx negative for varices; GI cleared  patient for resumption of long-term anticoagulation 6/10: heparin held ~30 min for EGD  Today, 04/07/2015  To begin apixaban Most recent heparin level supratherapeutic on 1500 units/hr CBC: Hgb sl low; Plt wnl SCr 1.24; CrCl 76 ml/min (90 ml/min by TBW) RN reports no bleeding complications or infusion issues.   Goal Range:  Prevent recurrence of VTE  Plan:  Heparin infusion stopped  Begin apixaban 5 mg PO bid  CBC, SCr at least q72 hr while on apixaban  Note that patient has liver disease which may be progressive.  Apixaban is not recommended for patients with Child-Pugh Class C liver failure (score 10+).  Would ensure close follow-up of liver disease; can use LMWH if patient does reach Class C.   Bernadene Person, PharmD Pager: [phone] 04/07/2015, 10:44 AM

## 2015-04-07 NOTE — Progress Notes (Signed)
Heparin infusion pause at 8:20am to 9:48 am for endoscopy.

## 2015-04-07 NOTE — Progress Notes (Signed)
CSW assisting with d/c planning. Pt has a rehab bed at [facility] on [facility] if stable for trans.  Cori Razor LCSW [phone]

## 2015-04-07 NOTE — Progress Notes (Signed)
Occupational Therapy Treatment Patient Details Name: [name] MRN: [id number] DOB: May 22, 1946 Today's Date: 04/07/2015    History of present illness [name] is a 69 y.o. male with multiple medical problems who presented to ED with weakness. He was diagnosed with C-diff by [facility] two months ago. C-diff still positive. Pt also with hx of CVA x 2 with residual L side weakness    OT comments  Patient making slow progress towards goals, continue plan of care for now. Pt max assist with bed mobility and to maintain static sitting EOB. Patient continues to follow one-step commands inconsistently and with increased time. Patient with complaints of throat hurting and being hoarse; RN notified. Continue to recommend SNF post acute d/c.    Follow Up Recommendations  SNF;Supervision/Assistance - 24 hour    Equipment Recommendations  Other (comment) (TBD next venue of care)    Recommendations for Other Services  None at this time   Precautions / Restrictions Precautions Precautions: Fall Restrictions Weight Bearing Restrictions: No    Mobility Bed Mobility Overal bed mobility: Needs Assistance Bed Mobility: Supine to Sit;Sit to Supine Rolling: Mod assist   Supine to sit: Max assist Sit to supine: Max assist   General bed mobility comments: Cues for sequencing, assistance for management of BLEs, trunk control, and use of BUEs  Transfers General transfer comment: Did not occur    Balance Overall balance assessment: Needs assistance Sitting-balance support: Bilateral upper extremity supported;Feet supported Sitting balance-Leahy Scale: Poor Sitting balance - Comments: zero balance with no UE supported    ADL Overall ADL's : Needs assistance/impaired General ADL Comments: Engaged pt in bed mobility and assisted pt to EOB. Pt required max assist to maintain static sitting, pt followed one step commands with extra time. Pt unable to perform functional activity sitting EOB due to  decreased balance, tolerance, and endurance. Pt laid back in supine and therapist put bed in chair like position for BUE strengthening exercises, see below.      Cognition   Behavior During Therapy: Flat affect Overall Cognitive Status: No family/caregiver present to determine baseline cognitive functioning Area of Impairment: Following commands;Safety/judgement        Following Commands: Follows one step commands with increased time;Follows one step commands inconsistently Safety/Judgement: Decreased awareness of safety      Exercises General Exercises - Upper Extremity Shoulder Flexion: AAROM;Both;10 reps;Seated (chair like position in bed) Shoulder Extension: AAROM;Both;10 reps;Seated (chair like position in bed) Digit Composite Flexion: AROM;Both;10 reps;Seated (chair like position in bed) Composite Extension: AROM;Both;10 reps;Seated (chair like position in bed)           Pertinent Vitals/ Pain       Pain Assessment: Faces Faces Pain Scale: Hurts a little bit Pain Location: "throat" Pain Descriptors / Indicators: Sore;Grimacing Pain Intervention(s): Monitored during session   Frequency Min 2X/week     Progress Toward Goals  OT Goals(current goals can now befound in the care plan section)  Progress towards OT goals: Progressing toward goals     Plan Discharge plan remains appropriate    Activity Tolerance Patient limited by fatigue   Patient Left in bed;with call bell/phone within reach   Notified RN of patient's complaints of hoarseness and sore throat.     Time: 4163-8453 OT Time Calculation (min): 21 min  Charges: OT General Charges $OT Visit: 1 Procedure OT Treatments $Therapeutic Activity: 8-22 mins  Tecumseh Yeagley , MS, OTR/L, CLT Pager: [phone]  04/07/2015, 2:21 PM

## 2015-04-07 NOTE — H&P (View-Only) (Signed)
Consultation  Referring Provider:  [facility]    Primary Care Physician:  [facility] Primary Gastroenterologist:  unassigned       Reason for Consultation:    Elevated LFTs          HPI:   [name] is a 69 y.o. male with multiple medical problems who presented to ED yesterday with weakness. He was diagnosed with C-diff by [facility] two months ago. Patient was apparently treated first with flagyl followed by Vancomycin. He reports completion of the antibiotics without resolution of diarrhea. C-diff still positive. Patient also found to have markedly elevated LFT. He denies previous history of liver problems. CT scan suggests cirrhosis as well as cholelithiasis without CBD dilation. Patient denies abdominal pain.   Past Medical History  Diagnosis Date  . Diabetes mellitus without complication   . Hypertension   . Presence of permanent cardiac pacemaker   . Hypothyroidism   . Stroke     Past Surgical History  Procedure Laterality Date  . Insert / replace / remove pacemaker    . Tonsillectomy      Family History  Problem Relation Age of Onset  . Diabetes Mellitus II Sister   . CAD Sister   . Hypertension Sister      History  Substance Use Topics  . Smoking status: Never Smoker   . Smokeless tobacco: Not on file  . Alcohol Use: No    Prior to Admission medications   Medication Sig Start Date End Date Taking? Authorizing Provider  acetaminophen (TYLENOL) 325 MG tablet Take 650 mg by mouth 2 (two) times daily.   Yes Historical Provider, MD  aspirin 81 MG tablet Take 81 mg by mouth daily.   Yes Historical Provider, MD  atorvastatin (LIPITOR) 80 MG tablet Take 80 mg by mouth at bedtime.   Yes Historical Provider, MD  brimonidine (ALPHAGAN) 0.2 % ophthalmic solution Place 1 drop into the left eye 2 (two) times daily.   Yes Historical Provider, MD  budesonide-formoterol (SYMBICORT) 160-4.5 MCG/ACT inhaler Inhale 2 puffs into the lungs 2 (two) times daily.   Yes  Historical Provider, MD  cetirizine (ZYRTEC) 10 MG tablet Take 10 mg by mouth daily.   Yes Historical Provider, MD  cholecalciferol (VITAMIN D) 1000 UNITS tablet Take 2,000 Units by mouth daily.   Yes Historical Provider, MD  hydroxypropyl methylcellulose / hypromellose (ISOPTO TEARS / GONIOVISC) 2.5 % ophthalmic solution Place 1 drop into both eyes 4 (four) times daily as needed for dry eyes (dry eyes).   Yes Historical Provider, MD  levothyroxine (SYNTHROID, LEVOTHROID) 100 MCG tablet Take 200 mcg by mouth daily before breakfast.   Yes Historical Provider, MD  metoprolol succinate (TOPROL-XL) 50 MG 24 hr tablet Take 25 mg by mouth daily. Take with or immediately following a meal.   Yes Historical Provider, MD  sertraline (ZOLOFT) 100 MG tablet Take 200 mg by mouth daily.   Yes Historical Provider, MD  warfarin (COUMADIN) 5 MG tablet Take 5-7.5 mg by mouth daily. Take 1 tablet (5 mg) by mouth daily on Sun, Tues, Thurs, Fri, Sat and Take 1.5 tablets (7.5 mg) by mouth on Mon and Wed.   Yes Historical Provider, MD    Current Facility-Administered Medications  Medication Dose Route Frequency Provider Last Rate Last Dose  . 0.9 % NaCl with KCl 20 mEq/ L  infusion   Intravenous Continuous Rise Patience, MD 75 mL/hr at 04/03/15 0630    . aspirin  chewable tablet 81 mg  81 mg Oral Daily Rise Patience, MD   81 mg at 04/03/15 1024  . brimonidine (ALPHAGAN) 0.2 % ophthalmic solution 1 drop  1 drop Left Eye BID Rise Patience, MD   1 drop at 04/03/15 1023  . budesonide-formoterol (SYMBICORT) 160-4.5 MCG/ACT inhaler 2 puff  2 puff Inhalation BID Rise Patience, MD      . insulin aspart (novoLOG) injection 0-9 Units  0-9 Units Subcutaneous TID WC Rise Patience, MD   0 Units at 04/03/15 0751  . insulin glargine (LANTUS) injection 10 Units  10 Units Subcutaneous QHS Rise Patience, MD      . levothyroxine (SYNTHROID, LEVOTHROID) tablet 200 mcg  200 mcg Oral QAC breakfast Rise Patience, MD   200 mcg at 04/03/15 0850  . loratadine (CLARITIN) tablet 10 mg  10 mg Oral Daily Rise Patience, MD   10 mg at 04/03/15 1024  . metoprolol succinate (TOPROL-XL) 24 hr tablet 25 mg  25 mg Oral Daily Rise Patience, MD   25 mg at 04/03/15 1029  . ondansetron (ZOFRAN) tablet 4 mg  4 mg Oral Q6H PRN Rise Patience, MD       Or  . ondansetron [facility]) injection 4 mg  4 mg Intravenous Q6H PRN Rise Patience, MD      . polyvinyl alcohol (LIQUIFILM TEARS) 1.4 % ophthalmic solution 1 drop  1 drop Both Eyes Daily PRN Rise Patience, MD      . saccharomyces boulardii (FLORASTOR) capsule 250 mg  250 mg Oral BID Rise Patience, MD   250 mg at 04/03/15 1024  . sertraline (ZOLOFT) tablet 200 mg  200 mg Oral Daily Rise Patience, MD   200 mg at 04/03/15 1024  . vancomycin (VANCOCIN) 50 mg/mL oral solution 125 mg  125 mg Oral 4 times per day Dorrene German, RPH   125 mg at 04/03/15 0850    Allergies as of 04/02/2015  . (No Known Allergies)     Review of Systems:    As per HPI, otherwise negative    Physical Exam:  Vital signs in last 24 hours: Temp:  [97.4 F (36.3 C)-97.7 F (36.5 C)] 97.4 F (36.3 C) (06/06 0458) Pulse Rate:  [60-85] 85 (06/06 1029) Resp:  [16] 16 (06/06 0458) BP: (148-168)/(81-95) 156/90 mmHg (06/06 1029) SpO2:  [95 %-98 %] 95 % (06/06 0330) Weight:  [260 lb (117.935 kg)] 260 lb (117.935 kg) (06/05 2103) Last BM Date: 04/03/15 General:   Pleasant black male in NAD Head:  Normocephalic and atraumatic. Eyes:   No icterus.   Conjunctiva pink. Ears:  Normal auditory acuity. Neck:  Supple Lungs:  Respirations even and unlabored. Lungs clear to auscultation bilaterally.   No wheezes, crackles, or rhonchi.  Heart:  Regular rate and rhythm; no MRG Abdomen:  Soft, nondistended, nontender. Normal bowel sounds. No appreciable masses or hepatomegaly.  Rectal:  Not performed.  Msk:  Symmetrical without gross deformities.    Extremities:  Without edema. Neurologic:  Alert and  oriented x4;  grossly normal neurologically. Skin:  Intact without significant lesions or rashes. Psych:  Alert and cooperative. Normal affect.  LAB RESULTS:  Recent Labs  04/02/15 2119 04/03/15 0650  WBC 5.7 6.6  HGB 13.7 13.1  HCT 41.1 40.1  PLT 166 167   BMET  Recent Labs  04/02/15 2119 04/03/15 0650  NA 136 138  K 3.4* 3.0*  CL 100*  101  CO2 29 28  GLUCOSE 140* 120*  BUN 22* 22*  CREATININE 1.49* 1.33*  CALCIUM 8.9 8.8*   LFT  Recent Labs  04/03/15 0650  PROT 6.9  ALBUMIN 2.5*  AST 795*  ALT 547*  ALKPHOS 601*  BILITOT 1.1  BILIDIR 0.6*  IBILI 0.5   PT/INR  Recent Labs  04/02/15 2312  LABPROT 31.3*  INR 3.09*    STUDIES: Ct Abdomen Pelvis W Contrast  04/03/2015   CLINICAL DATA:  Diarrhea. Recent diagnosis of C diff colitis. Weakness.  EXAM: CT ABDOMEN AND PELVIS WITH CONTRAST  TECHNIQUE: Multidetector CT imaging of the abdomen and pelvis was performed using the standard protocol following bolus administration of intravenous contrast.  CONTRAST:  161m OMNIPAQUE IOHEXOL 300 MG/ML  SOLN  COMPARISON:  None available.  FINDINGS: Hypoventilatory change at the lung bases. The heart is enlarged. Pacemaker partially included.  Decreased hepatic density with lobular contours and diminished size the left hepatic lobe, suggestive of cirrhosis. No focal hepatic lesion. There is a periportal lymph node measuring 1 cm in short axis dimension. Calcified gallstones within physiologically distended gallbladder. No biliary dilatation. Small hypodensity in the anterior spleen, likely hemangioma. Spleen is normal in size. The adrenal glands are normal. Mild pancreatic atrophy. There are multiple hypodensities scattered throughout the pancreatic parenchyma. The pancreatic head this measures 1.5 cm, in the distal body 0.9 cm, and in the tail 0.9 cm. Kidneys demonstrate symmetric enhancement and excretion. Small subcentimeter  bilateral renal cysts. Within the upper left kidney is a 1.4 cm lesion measuring soft tissue density, Hounsfield units of 38.  Small amount contrast of the distal esophagus. Stomach is decompressed. There are no dilated or thickened bowel loops. Midline ventral hernia contains normal appearing loops of small bowel. There is minimal colonic wall thickening involving the sigmoid colon without significant surrounding pericolonic inflammatory change. The remainder the colon is decompressed. Distal colonic diverticulosis without diverticulitis. The appendix is normal. No free air, free fluid, or intra-abdominal fluid collection.  No retroperitoneal adenopathy. Abdominal aorta is normal in caliber. Moderate atherosclerosis without aneurysm.  Within the pelvis the urinary bladder is distended, air in the bladder is likely related to instrumentation. Prostate gland appears prominent. There is no pelvic free fluid. No pelvic adenopathy. Probable post left inguinal hernia repair.  There are no acute or suspicious osseous abnormalities. Cortical irregularity about the right anterior iliac crest suggestive of remote prior injury. There scattered bone islands.  IMPRESSION: 1. Minimal colonic wall thickening involving the sigmoid colon, may reflect mild colitis. 2. Ventral abdominal wall hernia containing normal appearing loops of small bowel without bowel compromise or obstruction. 3. Nodular hepatic contours, suggestive of underlying cirrhosis. No focal hepatic lesion. 4. Small round hypodensities within the pancreas, largest measuring 1.5 cm. 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 In the absence of prior imaging, nonemergent MRCP could be considered if patient is able tolerate breath hold technique. 5. Cholelithiasis without cholecystitis. 6. Bilateral renal cysts. In addition there is a left upper pole 1.5 cm lesion measuring higher than simple fluid density. This may reflect  a complex or hemorrhagic cyst. Correlation with any prior imaging would be most helpful. In the absence of prior imaging, correlation with contrast-enhanced MRI is recommended.   Electronically Signed   By: MJeb LeveringM.D.   On: 04/03/2015 01:38    PREVIOUS ENDOSCOPIES:            Patient thinks he  had a normal colonoscopy at [facility] in March   Impression / Plan:   25. 69 year old male with abnormal LFTs, mixed picture. Transaminases in 500-700 range, alk phos 600's. Normal bilirubin. CTscan suggests cirrhosis. He has cholelithiasis but no CBD dilation nor abdominal pain to suggest choledocholithiasis. Patient complains of significant weakness. Will check viral hepatitis studies, ANA, ASMA. He may need MRCP. Recheck LFTs in am.   2. Diarrhea, recently treated for C-diff. Diarrhea persistent, C. diff positive again. CTscan reveals minimal sigmoid colitis.   3. Hx of dvt, on coumadin  4. Abnormal imaging of pancreas. CT scan suggests numerous hypodensities within pancreas,  possibly reflecting IPMN. MRCP may help better characterize these lesions.   Thanks   LOS: 0 days   Tye Savoy  04/03/2015, 11:16 AM     Attending physician's note   I have taken a history, examined the patient and reviewed the chart. I agree with the Advanced Practitioner's note, impression and recommendations. 69 year old male who receives his health care at [facility]. Admitted with elevated LFTs, diarrhea and weakness. Acute hepatic injury-etiology unclear. Cholelithiasis but no biliary dilation to suggest CBD stones. Pancreatic lesions concerning for IPMN. MRI/MRCP would be helpful to further evaluate however he has a pacemaker. Await hepatic serologies. Monitor LFTs, PT/INR. Vancomycin for 3 weeks and then pulse/taper for recurrent C diff.   Ladene Artist, MD [facility]

## 2015-04-07 NOTE — Progress Notes (Addendum)
Patient ID: [name], male   DOB: [birth year], 69 y.o.   MRN: [id number]    Progress Note   Subjective  Pt had EGD earlier today- no varices Liver bx yetserday- pending No complaints   Objective   Vital signs in last 24 hours: Temp:  [97.5 F (36.4 C)-98.4 F (36.9 C)] 98.2 F (36.8 C) (06/10 0754) Pulse Rate:  [51-78] 72 (06/10 1108) Resp:  [13-25] 18 (06/10 0910) BP: (130-185)/(58-104) 173/90 mmHg (06/10 1108) SpO2:  [93 %-99 %] 95 % (06/10 0910) Last BM Date: 04/03/15 General:    Older AA male  in NAD Heart:  Regular rate and rhythm; no murmurs Lungs: Respirations even and unlabored, lungs CTA bilaterally Abdomen:  Soft, nontender and nondistended. Normal bowel sounds. Extremities:  Without edema. Neurologic:  Alert and oriented,  grossly normal neurologically. Psych:  Cooperative. Normal mood and affect.  Intake/Output from previous day: 06/09 0701 - 06/10 0700 In: 1622.8 [I.V.:1622.8] Out: 2450 [Urine:2450] Intake/Output this shift: Total I/O In: -  Out: 250 [Urine:250]  Lab Results:  Recent Labs  04/06/15 0421 04/07/15 0120  WBC 7.0 7.8  HGB 12.0* 12.4*  HCT 36.1* 37.3*  PLT 148* 156   BMET  Recent Labs  04/05/15 1008 04/06/15 0421 04/07/15 0120  NA 135 132* 135  K 4.1 3.8 3.5  CL 103 100* 102  CO2 24 27 26   GLUCOSE 127* 251* 138*  BUN 30* 34* 25*  CREATININE 1.66* 1.62* 1.24  CALCIUM 9.0 8.8* 8.7*   LFT  Recent Labs  04/05/15 1008  PROT 7.1  ALBUMIN 2.5*  AST 829*  ALT 558*  ALKPHOS 740*  BILITOT 1.6*   PT/INR  Recent Labs  04/06/15 0421 04/07/15 0120  LABPROT 17.7* 16.5*  INR 1.45 1.31    Studies/Results: US Biopsy  04/06/2015   CLINICAL DATA:  Elevated LFTs, abnormal contour on CT suggesting cirrhosis.  EXAM: ULTRASOUND-GUIDED CORE LIVER BIOPSY  TECHNIQUE: An ultrasound guided liver biopsy was thoroughly discussed with the patient and questions were answered. The benefits, risks, alternatives, and complications were  also discussed. The patient understands and wishes to proceed with the procedure. A verbal as well as written consent was obtained.  Survey ultrasound of the liver was performed and an appropriate skin entry site was determined. Skin site was marked, prepped with Betadine, and draped in usual sterile fashion, and infiltrated locally with 1% lidocaine.  Intravenous Fentanyl and Versed were administered as conscious sedation during continuous cardiorespiratory monitoring by the radiology RN, with a total moderate sedation time of less than 30 minutes.  A 17 gauge trocar needle was advanced under ultrasound guidance into the liver. 3 coaxial 18gauge core samples were then obtained through the guide needle. The guide needle was removed. Post procedure scans demonstrate no apparent complication.  COMPLICATIONS: COMPLICATIONS None immediate  FINDINGS: Survey images of the liver demonstrate no focal lesion or intrahepatic biliary ductal dilatation. Cholelithiasis without gallbladder wall thickening incidentally noted. Core biopsy samples obtained without complication.  IMPRESSION: 1. Technically successful ultrasound guided core liver biopsy.   Electronically Signed   By: Corlis Leak M.D.   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- ANA , and F-actin IGG +- bx completed yesterday and pending- if positive  For autoimmune hepatitis will require steroids but would not initiate until biopsy reviewed and we are sure  he has resolved the C diff infection #3 Compensated cirrhosis- EGD negative for varices #4 Erosive gastritis/duodenitis- continue long term PPI- f/u H pylori- would not treat right now again secondary to cdiff #5 Pancreatic hypodensities- will need EUS outpt  GI can follow up outpt and if they decide to have GI care in [place]- we can arrange outpt F/U however he gets most of his care  thru the [place] in [place], he has multiple medical problems - and management coordination within one system would be best- he will be best served by continuing care thru [facility]- would be sure he has copies of labs/imaging and D/C summary to take to [facility]   Principal Problem:   Weakness Active Problems:   Elevated liver function tests   Diarrhea   Diabetes mellitus type 2, uncontrolled   Hypothyroidism   Essential hypertension   Gallstones   CKD (chronic kidney disease) stage 3, GFR 30-59 ml/min   Other cirrhosis of liver   Erosive gastritis   Duodenitis    LOS: 4 days   Amy [name]  04/07/2015, 12:45 PM      Attending physician's note   I have taken an interval history, reviewed the chart and examined the patient. I agree with the Advanced Practitioner's note, impression and recommendations.  Complete course of Vancomycin and Florastor for C diff as above.  PPI daily long term for erosive gastritis and erosive duodenitis. H. pylori Ab pending. Avoid ASA/NSAIDs.  If liver biopsy confirms autoimmune hepatitis would wait until C diff has completely resolved before starting corticosteroids.  Further evaluation of pancreatic lesions as outpatient.  OK to resume anticoagulation from GI standpoint.  OK for discharge from GI standpoint. GI signing off. Recommend GI follow up care at [facility] where he already receives most of his care.   Venita Lick. Russella Dar, MD [facility]

## 2015-04-07 NOTE — Progress Notes (Signed)
PT Cancellation Note  Patient Details Name: [name] MRN: [id number] DOB: [birth year]   Cancelled Treatment:     endoscopy this am   Armando Reichert 04/07/2015, 9:12 AM

## 2015-04-07 NOTE — Progress Notes (Signed)
ANTICOAGULATION CONSULT NOTE   Pharmacy Consult for Heparin (warfarin on hold for invasive procedures) Indication: Hx of DVT  Infusions:  . sodium chloride 1,000 mL (04/06/15 2035)  . heparin 1,500 Units/hr (04/06/15 1650)   Assessment: 69 y/o M admitted 6/5 currently on heparin infusion while warfarin is held.  Heparin drip was held 6/10 at 10:25 for liver biopsy that was completed ~1434.  Heparin infusion was resumed ~ 2 hours after procedure at 1729.  Heparin currently infusing at 15 ml/hr Heparin level 0.65, therapeutic CBC: Hgb 12.4, Plt 156 RN reports no complications or infusion issues.   Goal Range:  Heparin level 0.3 - 0.7 Monitor platelets by anticoagulation protocol: Yes   Plan:  Continue heparin IV infusion at 1500 units/hr  Heparin level in 8 hours to confirm therapeutic level.  Daily heparin level and CBC  Continue to monitor H&H and platelets  Follow up plans to resume warfarin.  Lynann Beaver PharmD, BCPS Pager [phone] 04/07/2015 2:42 AM

## 2015-04-07 NOTE — Progress Notes (Signed)
Patient Demographics  [name], is a 69 y.o. male, DOB - 08-30-1946, RCB:[id number]  Admit date - 04/02/2015   Admitting Physician Eduard Clos, MD  Outpatient Primary MD for the patient is No primary care provider on file.  LOS - 4   Chief Complaint  Patient presents with  . Diarrhea       Admission HPI/Brief narrative:  69 year-old with diabetes mellitus, previous CVA, COPD, PPM, history of DVT on warfarin, resents with diarrhea, found to have C. 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 US-guided biopsy by IR 6/9, EGD on 6/10 significant for erosive gastritis and duodenitis  Subjective:   [name] today has, No headache, No chest pain, No abdominal pain - No Nausea, No new weakness tingling or numbness, No Cough - SOB.   Assessment & Plan    Principal Problem:   Weakness Active Problems:   Elevated liver function tests   Diarrhea   Diabetes mellitus type 2, uncontrolled   Hypothyroidism   Essential hypertension   Gallstones   CKD (chronic kidney disease) stage 3, GFR 30-59 ml/min   Other cirrhosis of liver   Erosive gastritis   Duodenitis  C. difficile diarrhea - Improving on oral vancomycin, continue with probiotics.  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 - Patient to follow with GI as an outpatient regarding biopsy results and decision for stroke treatment, discussed with patient and wife, and they wish to continue to follow with [facility]. - CT showing cholelithiasis but no CBD dilation no abdominal pain to suggest choledocholithiasis.normal HIDA scan.  Erosive gastritis and duodenitis - As evident on EGD 04/07/15, and tinea with PPI, will hold aspirin initially patient is on  full anticoagulation.  Abnormal finding on imaging of pancreas - Patient will need EUS for further evaluation as outpatient.  History of DVT - INR was reversed initially with FFP and vitamin K for procedure. - Will start patient on Eliquis .  elevated trponins - denies any chest pain or shortness of breath, troponins plateaued 0.40>> 0.38>> 0.38 - 2-D echo showing EF 55%, with no wall motion abnormality.  History of CVA - Hold aspirin short-term giving evidence of erosive gastritis and duodenitis  CKD - Baseline creatinine 1.3-1.5,  at baseline  Hypertension - Uncontrolled, will add Norvasc to metoprolol.  Diabetes mellitus - Controlled on Lantus and insulin sliding scale  Code Status: Full  Family Communication: none at bedside  Disposition Plan: pending PT work up.   Procedures   EGD 04/07/15 1. Erosive gastritis in the gastric body 2. Erosive duodenitis in the duodenal bulb 3. Small hiatal hernia  US liver biopsy 04/06/15  Consults   GI IR  Medications  Scheduled Meds: . sodium chloride   Intravenous Once  . sodium chloride   Intravenous Once  . apixaban  5 mg Oral BID  . brimonidine  1 drop Left Eye BID  . budesonide-formoterol  2 puff Inhalation BID  . insulin aspart  0-9 Units Subcutaneous TID WC  . insulin glargine  10 Units Subcutaneous QHS  . levothyroxine  200 mcg Oral QAC  breakfast  . loratadine  10 mg Oral Daily  . metoprolol succinate  25 mg Oral Daily  . pantoprazole  40 mg Oral Q0600  . saccharomyces boulardii  250 mg Oral BID  . sertraline  200 mg Oral Daily  . tamsulosin  0.4 mg Oral Daily  . vancomycin  125 mg Oral 4 times per day   Continuous Infusions:   PRN Meds:.HYDROcodone-acetaminophen, ondansetron **OR** ondansetron (ZOFRAN) IV, polyvinyl alcohol  DVT Prophylaxis  On eliquis  Lab Results  Component Value Date   PLT 156 04/07/2015    Antibiotics    Anti-infectives    Start     Dose/Rate Route Frequency Ordered Stop    04/03/15 0615  vancomycin (VANCOCIN) 50 mg/mL oral solution 125 mg     125 mg Oral 4 times per day 04/03/15 0613            Objective:   Filed Vitals:   04/07/15 0849 04/07/15 0900 04/07/15 0910 04/07/15 1108  BP: 183/99 142/58 146/72 173/90  Pulse: 76 51 62 72  Temp:      TempSrc:      Resp: 23 18 18    Height:      Weight:      SpO2: 98% 98% 95%     Wt Readings from Last 3 Encounters:  04/02/15 117.935 kg (260 lb)     Intake/Output Summary (Last 24 hours) at 04/07/15 1302 Last data filed at 04/07/15 1045  Gross per 24 hour  Intake 1667.5 ml  Output   1800 ml  Net -132.5 ml     Physical Exam  Awake Alert, Oriented ,No new F.N deficits, Normal affect [place].AT,PERRAL Supple Neck,No JVD, No cervical lymphadenopathy appriciated.  Symmetrical Chest wall movement, Good air movement bilaterally. RRR,No Gallops,Rubs or new Murmurs, No Parasternal Heave +ve B.Sounds, Abd Soft, No tenderness, No organomegaly appriciated, No rebound - guarding or rigidity. No Cyanosis, Clubbing or edema, No new Rash or bruise     Data Review   Micro Results Recent Results (from the past 240 hour(s))  Clostridium Difficile by PCR     Status: Abnormal   Collection Time: 04/03/15  2:19 AM  Result Value Ref Range Status   C difficile by pcr POSITIVE (A) NEGATIVE Final    Comment: CRITICAL RESULT CALLED TO, READ BACK BY AND VERIFIED WITH: Raul Del RN 9:40 04/03/15 ([email or web address]) Performed at [facility]     Radiology Reports Nm Hepatobiliary Liver Func  04/04/2015   CLINICAL DATA:  69 year old male with history of gallstones. Total bilirubin 1.1.  EXAM: NUCLEAR MEDICINE HEPATOBILIARY IMAGING  TECHNIQUE: Sequential images of the abdomen were obtained out to 60 minutes following intravenous administration of radiopharmaceutical.  RADIOPHARMACEUTICALS:  5.5 mCi Tc-56m Choletec IV  COMPARISON:  CT of the abdomen and pelvis 04/03/2015.  FINDINGS: There is rapid uptake of radiotracer within the  hepatic parenchyma. Some gallbladder activity is noted by 10 minutes. Small bowel activity is noted by 15 minutes. Continued accumulation of radiotracer in the hepatic parenchyma and gallbladder, with continued excretion into the small bowel is observed throughout the examination.  IMPRESSION: 1. Normal examination. No findings to suggest cholecystitis or biliary tract obstruction.   Electronically Signed   By: Trudie Reed M.D.   On: 04/04/2015 09:06   Ct Abdomen Pelvis W Contrast  04/03/2015   CLINICAL DATA:  Diarrhea. Recent diagnosis of C diff colitis. Weakness.  EXAM: CT ABDOMEN AND PELVIS WITH CONTRAST  TECHNIQUE: Multidetector CT imaging of the abdomen and  pelvis was performed using the standard protocol following bolus administration of intravenous contrast.  CONTRAST:  OMNIPAQUE IOHEXOL 300 MG/ML  SOLN  COMPARISON:  None available.  FINDINGS: Hypoventilatory change at the lung bases. The heart is enlarged. Pacemaker partially included.  Decreased hepatic density with lobular contours and diminished size the left hepatic lobe, suggestive of cirrhosis. No focal hepatic lesion. There is a periportal lymph node measuring 1 cm in short axis dimension. Calcified gallstones within physiologically distended gallbladder. No biliary dilatation. Small hypodensity in the anterior spleen, likely hemangioma. Spleen is normal in size. The adrenal glands are normal. Mild pancreatic atrophy. There are multiple hypodensities scattered throughout the pancreatic parenchyma. The pancreatic head this measures 1.5 cm, in the distal body 0.9 cm, and in the tail 0.9 cm. Kidneys demonstrate symmetric enhancement and excretion. Small subcentimeter bilateral renal cysts. Within the upper left kidney is a 1.4 cm lesion measuring soft tissue density, Hounsfield units of 38.  Small amount contrast of the distal esophagus. Stomach is decompressed. There are no dilated or thickened bowel loops. Midline ventral hernia contains  normal appearing loops of small bowel. There is minimal colonic wall thickening involving the sigmoid colon without significant surrounding pericolonic inflammatory change. The remainder the colon is decompressed. Distal colonic diverticulosis without diverticulitis. The appendix is normal. No free air, free fluid, or intra-abdominal fluid collection.  No retroperitoneal adenopathy. Abdominal aorta is normal in caliber. Moderate atherosclerosis without aneurysm.  Within the pelvis the urinary bladder is distended, air in the bladder is likely related to instrumentation. Prostate gland appears prominent. There is no pelvic free fluid. No pelvic adenopathy. Probable post left inguinal hernia repair.  There are no acute or suspicious osseous abnormalities. Cortical irregularity about the right anterior iliac crest suggestive of remote prior injury. There scattered bone islands.  IMPRESSION: 1. Minimal colonic wall thickening involving the sigmoid colon, may reflect mild colitis. 2. Ventral abdominal wall hernia containing normal appearing loops of small bowel without bowel compromise or obstruction. 3. Nodular hepatic contours, suggestive of underlying cirrhosis. No focal hepatic lesion. 4. Small round hypodensities within the pancreas, largest measuring 1.5 cm. 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 In the absence of prior imaging, nonemergent MRCP could be considered if patient is able tolerate breath hold technique. 5. Cholelithiasis without cholecystitis. 6. Bilateral renal cysts. In addition there is a left upper pole 1.5 cm lesion measuring higher than simple fluid density. This may reflect a complex or hemorrhagic cyst. Correlation with any prior imaging would be most helpful. In the absence of prior imaging, correlation with contrast-enhanced MRI is recommended.   Electronically Signed   By: Rubye Oaks M.D.   On: 04/03/2015 01:38   US Biopsy  04/06/2015    CLINICAL DATA:  Elevated LFTs, abnormal contour on CT suggesting cirrhosis.  EXAM: ULTRASOUND-GUIDED CORE LIVER BIOPSY  TECHNIQUE: An ultrasound guided liver biopsy was thoroughly discussed with the patient and questions were answered. The benefits, risks, alternatives, and complications were also discussed. The patient understands and wishes to proceed with the procedure. A verbal as well as written consent was obtained.  Survey ultrasound of the liver was performed and an appropriate skin entry site was determined. Skin site was marked, prepped with Betadine, and draped in usual sterile fashion, and infiltrated locally with 1% lidocaine.  Intravenous Fentanyl and Versed were administered as conscious sedation during continuous cardiorespiratory monitoring by the radiology RN, with a total moderate sedation time  of less than 30 minutes.  A 17 gauge trocar needle was advanced under ultrasound guidance into the liver. 3 coaxial 18gauge core samples were then obtained through the guide needle. The guide needle was removed. Post procedure scans demonstrate no apparent complication.  COMPLICATIONS: COMPLICATIONS None immediate  FINDINGS: Survey images of the liver demonstrate no focal lesion or intrahepatic biliary ductal dilatation. Cholelithiasis without gallbladder wall thickening incidentally noted. Core biopsy samples obtained without complication.  IMPRESSION: 1. Technically successful ultrasound guided core liver biopsy.   Electronically Signed   By: Corlis Leak M.D.   On: 04/06/2015 15:33     CBC  Recent Labs Lab 04/02/15 2119 04/03/15 0650 04/06/15 0421 04/07/15 0120  WBC 5.7 6.6 7.0 7.8  HGB 13.7 13.1 12.0* 12.4*  HCT 41.1 40.1 36.1* 37.3*  PLT 166 167 148* 156  MCV 86.0 85.5 83.6 84.6  MCH 28.7 27.9 27.8 28.1  MCHC 33.3 32.7 33.2 33.2  RDW 17.2* 17.3* 17.9* 17.6*  LYMPHSABS 1.9 2.4  --   --   MONOABS 1.0 1.2*  --   --   EOSABS 0.2 0.2  --   --   BASOSABS 0.0 0.0  --   --      Chemistries   Recent Labs Lab 04/02/15 2119 04/03/15 0650 04/04/15 0512 04/05/15 1008 04/06/15 0421 04/07/15 0120  NA 136 138 136 135 132* 135  K 3.4* 3.0* 3.4* 4.1 3.8 3.5  CL 100* 101 102 103 100* 102  CO2 GLUCOSE 140* 120* 139* 127* 251* 138*  BUN 22* 22* 19 30* 34* 25*  CREATININE 1.49* 1.33* 1.30* 1.66* 1.62* 1.24  CALCIUM 8.9 8.8* 8.7* 9.0 8.8* 8.7*  AST 780* 795* 744* 829*  --   --   ALT 560* 547* 508* 558*  --   --   ALKPHOS 651* 601* 668* 740*  --   --   BILITOT 0.9 1.1 1.1 1.6*  --   --    ------------------------------------------------------------------------------------------------------------------ estimated creatinine clearance is 75.7 mL/min (by C-G formula based on Cr of 1.24). ------------------------------------------------------------------------------------------------------------------ No results for input(s): HGBA1C in the last 72 hours. ------------------------------------------------------------------------------------------------------------------ No results for input(s): CHOL, HDL, LDLCALC, TRIG, CHOLHDL, LDLDIRECT in the last 72 hours. ------------------------------------------------------------------------------------------------------------------ No results for input(s): TSH, T4TOTAL, T3FREE, THYROIDAB in the last 72 hours.  Invalid input(s): FREET3 ------------------------------------------------------------------------------------------------------------------ No results for input(s): VITAMINB12, FOLATE, FERRITIN, TIBC, IRON, RETICCTPCT in the last 72 hours.  Coagulation profile  Recent Labs Lab 04/04/15 0512 04/05/15 0617 04/05/15 1540 04/06/15 0421 04/07/15 0120  INR 3.54* 3.06* 3.36* 1.45 1.31    No results for input(s): DDIMER in the last 72 hours.  Cardiac Enzymes  Recent Labs Lab 04/04/15 1123 04/04/15 1655 04/04/15 2227  TROPONINI 0.40* 0.38* 0.38*    ------------------------------------------------------------------------------------------------------------------ Invalid input(s): POCBNP     Time Spent in minutes   25 minutes   Tacora Athanas M.D on 04/07/2015 at 1:02 PM  Between 7am to 7pm - Pager - [phone]  After 7pm go to [email or web address] - password [facility]  [facility]   Office  [phone]

## 2015-04-07 NOTE — Op Note (Addendum)
[facility] [address] [place], [address]   ENDOSCOPY PROCEDURE REPORT  PATIENT: [name], [name]  MR#: [id number] BIRTHDATE: 1946/04/08 , 69  yrs. old GENDER: male ENDOSCOPIST: [name] Dare, MD, [facility] REFERRED BY:  [facility] PROCEDURE DATE:  04/07/2015 PROCEDURE:  EGD, diagnostic ASA CLASS:     Class III INDICATIONS:  screening for varices. MEDICATIONS: Fentanyl 25 mcg IV and Versed 2 mg IV TOPICAL ANESTHETIC: Cetacaine Spray DESCRIPTION OF PROCEDURE: After the risks benefits and alternatives of the procedure were thoroughly explained, informed consent was obtained.  The    endoscope was introduced through the mouth and advanced to the second portion of the duodenum , Without limitations.  The instrument was slowly withdrawn as the mucosa was fully examined.    ESOPHAGUS: The mucosa of the esophagus appeared normal. No esophageal varices noted. STOMACH: Moderate erosive gastritis (inflammation) was found in the gastric body.  No gastric varices noted. The stomach otherwise appeared normal. DUODENUM: Moderate erosive duodenitis was found in the duodenal bulb.   The duodenal mucosa showed no abnormalities in the 2nd part of the duodenum.  Retroflexed views revealed a small hiatal hernia. The scope was then withdrawn from the patient and the procedure completed.  COMPLICATIONS: There were no immediate complications.  ENDOSCOPIC IMPRESSION: 1.   Erosive gastritis in the gastric body 2.   Erosive duodenitis in the duodenal bulb 3.   Small hiatal hernia  RECOMMENDATIONS: 1.  Avoid ASA/NSAIDS long term, especially while anticoagulated 2.  PPI qam long term 3.  H. pylori Ab, treat if positive 4.  OK to resume anticoagulation today from GI standpoint  eSigned:  [name] Dare, MD, [facility] 04/07/2015 9:11 AM Revised: 04/07/2015 9:11 AM

## 2015-04-08 LAB — PROTIME-INR
INR: 1.37 (ref 0.00–1.49)
PROTHROMBIN TIME: 17 s — AB (ref 11.6–15.2)

## 2015-04-08 LAB — H. PYLORI ANTIBODY, IGG: H Pylori IgG: 1.1 U/mL — ABNORMAL HIGH (ref 0.0–0.8)

## 2015-04-08 LAB — HEPARIN LEVEL (UNFRACTIONATED): Heparin Unfractionated: >2.2 IU/mL — ABNORMAL HIGH (ref 0.30–0.70)

## 2015-04-08 LAB — GLUCOSE, CAPILLARY
GLUCOSE-CAPILLARY: 231 mg/dL — AB (ref 65–99)
Glucose-Capillary: 171 mg/dL — ABNORMAL HIGH (ref 65–99)

## 2015-04-08 MED ORDER — SACCHAROMYCES BOULARDII 250 MG PO CAPS: 250.0000 mg | ORAL_CAPSULE | Freq: Two times a day (BID) | ORAL | Status: DC

## 2015-04-08 MED ORDER — APIXABAN 5 MG PO TABS: 5.0000 mg | ORAL_TABLET | Freq: Two times a day (BID) | ORAL | Status: DC

## 2015-04-08 MED ORDER — INSULIN ASPART 100 UNIT/ML ~~LOC~~ SOLN
0.0000 [IU] | Freq: Three times a day (TID) | SUBCUTANEOUS | Status: DC
Start: 2015-04-08 — End: 2015-04-11

## 2015-04-08 MED ORDER — VANCOMYCIN 50 MG/ML ORAL SOLUTION: 125.0000 mg | Freq: Four times a day (QID) | ORAL | Status: AC

## 2015-04-08 MED ORDER — TAMSULOSIN HCL 0.4 MG PO CAPS: 0.4000 mg | ORAL_CAPSULE | Freq: Every day | ORAL | Status: DC

## 2015-04-08 MED ORDER — PANTOPRAZOLE SODIUM 40 MG PO TBEC: 40.0000 mg | DELAYED_RELEASE_TABLET | Freq: Every day | ORAL | Status: DC

## 2015-04-08 MED ORDER — AMLODIPINE BESYLATE 10 MG PO TABS: 10.0000 mg | ORAL_TABLET | Freq: Every day | ORAL | Status: DC

## 2015-04-08 MED ORDER — RANITIDINE HCL 300 MG PO TABS: 300.0000 mg | ORAL_TABLET | Freq: Two times a day (BID) | ORAL | Status: DC

## 2015-04-08 MED ORDER — POLYVINYL ALCOHOL 1.4 % OP SOLN: 1.0000 [drp] | Freq: Every day | OPHTHALMIC | Status: AC | PRN

## 2015-04-08 MED ORDER — INSULIN GLARGINE 100 UNIT/ML ~~LOC~~ SOLN
10.0000 [IU] | Freq: Every day | SUBCUTANEOUS | Status: DC
Start: 2015-04-08 — End: 2015-04-15

## 2015-04-08 NOTE — Plan of Care (Signed)
Problem: Phase I Progression Outcomes Goal: OOB as tolerated unless otherwise ordered Outcome: Not Applicable Date Met:  67/54/49 Bedrest

## 2015-04-08 NOTE — Discharge Instructions (Signed)
Follow with Primary MD  [facility] in 7 days   Get CBC, CMP, 2 view Chest X ray checked  by Primary MD next visit.    Activity: As tolerated with Full fall precautions use walker/cane & assistance as needed   Disposition SNF   Diet: Heart Healthy /carbohydrate modified , with feeding assistance and aspiration precautions.  For Heart failure patients - Check your Weight same time everyday, if you gain over 2 pounds, or you develop in leg swelling, experience more shortness of breath or chest pain, call your Primary MD immediately. Follow Cardiac Low Salt Diet and 1.5 lit/day fluid restriction.   On your next visit with your primary care physician please Get Medicines reviewed and adjusted.   Please request your Prim.MD to go over all Hospital Tests and Procedure/Radiological results at the follow up, please get all Hospital records sent to your Prim MD by signing hospital release before you go home.   If you experience worsening of your admission symptoms, develop shortness of breath, life threatening emergency, suicidal or homicidal thoughts you must seek medical attention immediately by calling 911 or calling your MD immediately  if symptoms less severe.  You Must read complete instructions/literature along with all the possible adverse reactions/side effects for all the Medicines you take and that have been prescribed to you. Take any new Medicines after you have completely understood and accpet all the possible adverse reactions/side effects.   Do not drive, operating heavy machinery, perform activities at heights, swimming or participation in water activities or provide baby sitting services if your were admitted for syncope or siezures until you have seen by Primary MD or a Neurologist and advised to do so again.  Do not drive when taking Pain medications.    Do not take more than prescribed Pain, Sleep and Anxiety Medications  Special Instructions: If you have smoked or chewed  Tobacco  in the last 2 yrs please stop smoking, stop any regular Alcohol  and or any Recreational drug use.  Wear Seat belts while driving.   Please note  You were cared for by a hospitalist during your hospital stay. If you have any questions about your discharge medications or the care you received while you were in the hospital after you are discharged, you can call the unit and asked to speak with the hospitalist on call if the hospitalist that took care of you is not available. Once you are discharged, your primary care physician will handle any further medical issues. Please note that NO REFILLS for any discharge medications will be authorized once you are discharged, as it is imperative that you return to your primary care physician (or establish a relationship with a primary care physician if you do not have one) for your aftercare needs so that they can reassess your need for medications and monitor your lab values.

## 2015-04-08 NOTE — Discharge Summary (Signed)
[name], is a 69 y.o. male  DOB 1946-06-19  MRN [id number].  Admission date:  04/02/2015  Admitting Physician  Eduard Clos, MD  Discharge Date:  04/08/2015   Primary MD  No primary care provider on file.  Recommendations for primary care physician /skilled nursing facility physician for things to follow:  - Please check basic labs including CBC, BMP during next visit. - Patient needs to follow with gastroenterology next week regardingrecommendation for treatment for possible immune hepatitis,  and further workup for pancreatic lesion as an outpatient.    Admission Diagnosis  Diarrhea [R19.7] Weakness [R53.1] Gallstones [K80.20] Elevated liver function tests [R79.89]   Discharge Diagnosis  Diarrhea [R19.7] Weakness [R53.1] Gallstones [K80.20] Elevated liver function tests [R79.89]   Principal Problem:   Weakness Active Problems:   Elevated liver function tests   Diarrhea   Diabetes mellitus type 2, uncontrolled   Hypothyroidism   Essential hypertension   Gallstones   CKD (chronic kidney disease) stage 3, GFR 30-59 ml/min   Other cirrhosis of liver   Erosive gastritis   Duodenitis   Hepatitis      Past Medical History  Diagnosis Date  . Diabetes mellitus without complication   . Hypertension   . Presence of permanent cardiac pacemaker   . Hypothyroidism   . Stroke     Past Surgical History  Procedure Laterality Date  . Insert / replace / remove pacemaker    . Tonsillectomy         History of present illness and  Hospital Course:     Kindly see H&P for history of present illness and admission details, please review complete Labs, Consult reports and Test reports for all details in brief  HPI  from the history and physical done on the day of admission  [name] is a 69 y.o. male with history of diabetes mellitus type 2, previous stroke, chronic kidney disease,  permanent pacemaker, history of DVT on Coumadin, hypothyroidism was brought to the ER after patient was signed persistent diarrhea and weakness. Patient's wife states that patient started developing diarrhea 2 months ago and was initially diagnosed with C. difficile and was treated with antibiotics. Despite this patient is still having diarrhea 4-5 episodes daily. Denies any nausea vomiting or abdominal pain. Patient denies any chest pain shortness of breath fever chills. Patient has become bedbound over the last 2 months because of his weakness. Prior to which patient used to be ambulatory. In the ER patient had CT abdomen and pelvis which shows possible sigmoid colitis with gallbladder stones and intraductal papillary tumor. Patient's LFTs also is markedly elevated. Patient denies any dizziness loss of consciousness or using any extra amounts of Tylenol. Denies any recent sick contacts. On-call gastroenterologist Dr. Juanda Chance was consulted by ER physician  Hospital Course  69 year-old with diabetes mellitus, previous CVA, COPD, PPM, history of DVT on warfarin, resents with diarrhea, found to have C. 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 US-guided biopsy by IR 6/9, EGD on 6/10 significant for erosive gastritis and duodenitis  C. difficile diarrhea - Improving on oral vancomycin, continue with probiotics, to finish total of 14 days.  Elevated LFTs/autoimmune hepatitis -Elevated F-Actin IgG, ANA, workup suspicious for autoimmune hepatitis, US guided liver biopsy 04/06/15 by IR. Biopsies suspicious for autoimmune hepatitis, giving patient C. Difficile, will hold on starting any steroidal treatment for C. difficile is resolved as per discussion with GI Dr. Russella Dar. - CT showing cholelithiasis but no CBD dilation no abdominal pain to suggest choledocholithiasis.normal HIDA scan.  Erosive gastritis and  duodenitis - As evident on EGD 04/07/15, will start Zantac 300 mg twice a day (holding PPI giving C. Difficile)  Abnormal finding on imaging of pancreas - Patient will need EUS for further evaluation as outpatient.  History of DVT - INR was reversed initially with FFP and vitamin K for procedure. - Transition to Eliquis.  elevated trponins - denies any chest pain or shortness of breath, troponins plateaued 0.40>> 0.38>> 0.38 - 2-D echo showing EF 55%, with no wall motion abnormality.  History of CVA - Hold aspirin short-term giving evidence of erosive gastritis and duodenitis  CKD - Baseline creatinine 1.3-1.5, at baseline  Hypertension - Norvasc added to metoprolol during hospital stay  Diabetes mellitus -on Lantus and insulin sliding scale    Discharge Condition:  stable   Follow UP  Follow-up Information    Follow up with Judie Petit T. Russella Dar, MD. Schedule an appointment as soon as possible for a visit in 1 week.   Specialty:  Gastroenterology   Why:  Follow-up on liver biopsy, and pancreatic lesion.   Contact information:   [address] [place] [address] [phone]         Discharge Instructions  and  Discharge Medications         Discharge Instructions    Discharge instructions    Complete by:  As directed   Follow with Primary MD  [facility] in 7 days   Get CBC, CMP, 2 view Chest X ray checked  by Primary MD next visit.    Activity: As tolerated with Full fall precautions use walker/cane & assistance as needed   Disposition SNF   Diet: Heart Healthy /carbohydrate modified , with feeding assistance and aspiration precautions.  For Heart failure patients - Check your Weight same time everyday, if you gain over 2 pounds, or you develop in leg swelling, experience more shortness of breath or chest pain, call your Primary MD immediately. Follow Cardiac Low Salt Diet and 1.5 lit/day fluid restriction.   On your next visit with your primary  care physician please Get Medicines reviewed and adjusted.   Please request your Prim.MD to go over all Hospital Tests and Procedure/Radiological results at the follow up, please get all Hospital records sent to your Prim MD by signing hospital release before you go home.   If you experience worsening of your admission symptoms, develop shortness of breath, life threatening emergency, suicidal or homicidal thoughts you must seek medical attention immediately by calling 911 or calling your MD immediately  if symptoms less severe.  You Must read complete instructions/literature along with all the possible adverse reactions/side effects for all the Medicines you take and that have been prescribed to you. Take any new Medicines after you have completely understood and accpet all the possible adverse reactions/side effects.   Do not drive, operating heavy machinery, perform activities at heights, swimming or  participation in water activities or provide baby sitting services if your were admitted for syncope or siezures until you have seen by Primary MD or a Neurologist and advised to do so again.  Do not drive when taking Pain medications.    Do not take more than prescribed Pain, Sleep and Anxiety Medications  Special Instructions: If you have smoked or chewed Tobacco  in the last 2 yrs please stop smoking, stop any regular Alcohol  and or any Recreational drug use.  Wear Seat belts while driving.   Please note  You were cared for by a hospitalist during your hospital stay. If you have any questions about your discharge medications or the care you received while you were in the hospital after you are discharged, you can call the unit and asked to speak with the hospitalist on call if the hospitalist that took care of you is not available. Once you are discharged, your primary care physician will handle any further medical issues. Please note that NO REFILLS for any discharge medications will be  authorized once you are discharged, as it is imperative that you return to your primary care physician (or establish a relationship with a primary care physician if you do not have one) for your aftercare needs so that they can reassess your need for medications and monitor your lab values.     Increase activity slowly    Complete by:  As directed             Medication List    STOP taking these medications        acetaminophen 325 MG tablet  Commonly known as:  TYLENOL     aspirin 81 MG tablet     warfarin 5 MG tablet  Commonly known as:  COUMADIN      TAKE these medications        amLODipine 10 MG tablet  Commonly known as:  NORVASC  Take 1 tablet (10 mg total) by mouth daily.     apixaban 5 MG Tabs tablet  Commonly known as:  ELIQUIS  Take 1 tablet (5 mg total) by mouth 2 (two) times daily.     atorvastatin 80 MG tablet  Commonly known as:  LIPITOR  Take 80 mg by mouth at bedtime.     brimonidine 0.2 % ophthalmic solution  Commonly known as:  ALPHAGAN  Place 1 drop into the left eye 2 (two) times daily.     budesonide-formoterol 160-4.5 MCG/ACT inhaler  Commonly known as:  SYMBICORT  Inhale 2 puffs into the lungs 2 (two) times daily.     cetirizine 10 MG tablet  Commonly known as:  ZYRTEC  Take 10 mg by mouth daily.     cholecalciferol 1000 UNITS tablet  Commonly known as:  VITAMIN D  Take 2,000 Units by mouth daily.     hydroxypropyl methylcellulose / hypromellose 2.5 % ophthalmic solution  Commonly known as:  ISOPTO TEARS / GONIOVISC  Place 1 drop into both eyes 4 (four) times daily as needed for dry eyes (dry eyes).     insulin aspart 100 UNIT/ML injection  Commonly known as:  novoLOG  Inject 0-9 Units into the skin 3 (three) times daily with meals.     insulin glargine 100 UNIT/ML injection  Commonly known as:  LANTUS  Inject 0.1 mLs (10 Units total) into the skin at bedtime.     levothyroxine 100 MCG tablet  Commonly known as:  SYNTHROID,  LEVOTHROID  Take 200  mcg by mouth daily before breakfast.     metoprolol succinate 50 MG 24 hr tablet  Commonly known as:  TOPROL-XL  Take 25 mg by mouth daily. Take with or immediately following a meal.     polyvinyl alcohol 1.4 % ophthalmic solution  Commonly known as:  LIQUIFILM TEARS  Place 1 drop into both eyes daily as needed for dry eyes.     ranitidine 300 MG tablet  Commonly known as:  ZANTAC  Take 1 tablet (300 mg total) by mouth 2 (two) times daily.     saccharomyces boulardii 250 MG capsule  Commonly known as:  FLORASTOR  Take 1 capsule (250 mg total) by mouth 2 (two) times daily.     sertraline 100 MG tablet  Commonly known as:  ZOLOFT  Take 200 mg by mouth daily.     tamsulosin 0.4 MG Caps capsule  Commonly known as:  FLOMAX  Take 1 capsule (0.4 mg total) by mouth daily.     vancomycin 50 mg/mL oral solution  Commonly known as:  VANCOCIN  Take 2.5 mLs (125 mg total) by mouth every 6 (six) hours.          Diet and Activity recommendation: See Discharge Instructions above   Consults obtained -  GI IR  Major procedures and Radiology Reports - PLEASE review detailed and final reports for all details, in brief -   EGD 04/07/15 1. Erosive gastritis in the gastric body 2. Erosive duodenitis in the duodenal bulb 3. Small hiatal hernia  US liver biopsy 04/06/15   Nm Hepatobiliary Liver Func  04/04/2015   CLINICAL DATA:  69 year old male with history of gallstones. Total bilirubin 1.1.  EXAM: NUCLEAR MEDICINE HEPATOBILIARY IMAGING  TECHNIQUE: Sequential images of the abdomen were obtained out to 60 minutes following intravenous administration of radiopharmaceutical.  RADIOPHARMACEUTICALS:  5.5 mCi Tc-55m Choletec IV  COMPARISON:  CT of the abdomen and pelvis 04/03/2015.  FINDINGS: There is rapid uptake of radiotracer within the hepatic parenchyma. Some gallbladder activity is noted by 10 minutes. Small bowel activity is noted by 15 minutes. Continued accumulation  of radiotracer in the hepatic parenchyma and gallbladder, with continued excretion into the small bowel is observed throughout the examination.  IMPRESSION: 1. Normal examination. No findings to suggest cholecystitis or biliary tract obstruction.   Electronically Signed   By: Trudie Reed M.D.   On: 04/04/2015 09:06   Ct Abdomen Pelvis W Contrast  04/03/2015   CLINICAL DATA:  Diarrhea. Recent diagnosis of C diff colitis. Weakness.  EXAM: CT ABDOMEN AND PELVIS WITH CONTRAST  TECHNIQUE: Multidetector CT imaging of the abdomen and pelvis was performed using the standard protocol following bolus administration of intravenous contrast.  CONTRAST:  OMNIPAQUE IOHEXOL 300 MG/ML  SOLN  COMPARISON:  None available.  FINDINGS: Hypoventilatory change at the lung bases. The heart is enlarged. Pacemaker partially included.  Decreased hepatic density with lobular contours and diminished size the left hepatic lobe, suggestive of cirrhosis. No focal hepatic lesion. There is a periportal lymph node measuring 1 cm in short axis dimension. Calcified gallstones within physiologically distended gallbladder. No biliary dilatation. Small hypodensity in the anterior spleen, likely hemangioma. Spleen is normal in size. The adrenal glands are normal. Mild pancreatic atrophy. There are multiple hypodensities scattered throughout the pancreatic parenchyma. The pancreatic head this measures 1.5 cm, in the distal body 0.9 cm, and in the tail 0.9 cm. Kidneys demonstrate symmetric enhancement and excretion. Small subcentimeter bilateral renal cysts. Within the upper  left kidney is a 1.4 cm lesion measuring soft tissue density, Hounsfield units of 38.  Small amount contrast of the distal esophagus. Stomach is decompressed. There are no dilated or thickened bowel loops. Midline ventral hernia contains normal appearing loops of small bowel. There is minimal colonic wall thickening involving the sigmoid colon without significant surrounding  pericolonic inflammatory change. The remainder the colon is decompressed. Distal colonic diverticulosis without diverticulitis. The appendix is normal. No free air, free fluid, or intra-abdominal fluid collection.  No retroperitoneal adenopathy. Abdominal aorta is normal in caliber. Moderate atherosclerosis without aneurysm.  Within the pelvis the urinary bladder is distended, air in the bladder is likely related to instrumentation. Prostate gland appears prominent. There is no pelvic free fluid. No pelvic adenopathy. Probable post left inguinal hernia repair.  There are no acute or suspicious osseous abnormalities. Cortical irregularity about the right anterior iliac crest suggestive of remote prior injury. There scattered bone islands.  IMPRESSION: 1. Minimal colonic wall thickening involving the sigmoid colon, may reflect mild colitis. 2. Ventral abdominal wall hernia containing normal appearing loops of small bowel without bowel compromise or obstruction. 3. Nodular hepatic contours, suggestive of underlying cirrhosis. No focal hepatic lesion. 4. Small round hypodensities within the pancreas, largest measuring 1.5 cm. 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 In the absence of prior imaging, nonemergent MRCP could be considered if patient is able tolerate breath hold technique. 5. Cholelithiasis without cholecystitis. 6. Bilateral renal cysts. In addition there is a left upper pole 1.5 cm lesion measuring higher than simple fluid density. This may reflect a complex or hemorrhagic cyst. Correlation with any prior imaging would be most helpful. In the absence of prior imaging, correlation with contrast-enhanced MRI is recommended.   Electronically Signed   By: Rubye Oaks M.D.   On: 04/03/2015 01:38   US Biopsy  04/06/2015   CLINICAL DATA:  Elevated LFTs, abnormal contour on CT suggesting cirrhosis.  EXAM: ULTRASOUND-GUIDED CORE LIVER BIOPSY  TECHNIQUE: An  ultrasound guided liver biopsy was thoroughly discussed with the patient and questions were answered. The benefits, risks, alternatives, and complications were also discussed. The patient understands and wishes to proceed with the procedure. A verbal as well as written consent was obtained.  Survey ultrasound of the liver was performed and an appropriate skin entry site was determined. Skin site was marked, prepped with Betadine, and draped in usual sterile fashion, and infiltrated locally with 1% lidocaine.  Intravenous Fentanyl and Versed were administered as conscious sedation during continuous cardiorespiratory monitoring by the radiology RN, with a total moderate sedation time of less than 30 minutes.  A 17 gauge trocar needle was advanced under ultrasound guidance into the liver. 3 coaxial 18gauge core samples were then obtained through the guide needle. The guide needle was removed. Post procedure scans demonstrate no apparent complication.  COMPLICATIONS: COMPLICATIONS None immediate  FINDINGS: Survey images of the liver demonstrate no focal lesion or intrahepatic biliary ductal dilatation. Cholelithiasis without gallbladder wall thickening incidentally noted. Core biopsy samples obtained without complication.  IMPRESSION: 1. Technically successful ultrasound guided core liver biopsy.   Electronically Signed   By: Corlis Leak M.D.   On: 04/06/2015 15:33    Micro Results     Recent Results (from the past 240 hour(s))  Clostridium Difficile by PCR     Status: Abnormal   Collection Time: 04/03/15  2:19 AM  Result Value Ref Range Status   C difficile by  pcr POSITIVE (A) NEGATIVE Final    Comment: CRITICAL RESULT CALLED TO, READ BACK BY AND VERIFIED WITH: Raul Del RN 9:40 04/03/15 ([email or web address]) Performed at [facility]        Today   Subjective:   [name] today has no headache,no chest abdominal pain,no new weakness tingling or numbness, feels much better today.   Objective:    Blood pressure 145/77, pulse 85, temperature 98.2 F (36.8 C), temperature source Oral, resp. rate 18, height 6\' 3"  (1.905 m), weight 117.935 kg (260 lb), SpO2 95 %.   Intake/Output Summary (Last 24 hours) at 04/08/15 1127 Last data filed at 04/08/15 0900  Gross per 24 hour  Intake   1620 ml  Output    400 ml  Net   1220 ml    Exam Awake Alert, Oriented ,No new F.N deficits, Normal affect [place].AT,PERRAL Supple Neck,No JVD, No cervical lymphadenopathy appriciated.  Symmetrical Chest wall movement, Good air movement bilaterally. RRR,No Gallops,Rubs or new Murmurs, No Parasternal Heave +ve B.Sounds, Abd Soft, No tenderness, No organomegaly appriciated, No rebound - guarding or rigidity. No Cyanosis, Clubbing or edema, No new Rash or bruise   Data Review   CBC w Diff:  Lab Results  Component Value Date   WBC 7.8 04/07/2015   HGB 12.4* 04/07/2015   HCT 37.3* 04/07/2015   PLT 156 04/07/2015   LYMPHOPCT 36 04/03/2015   MONOPCT 18* 04/03/2015   EOSPCT 3 04/03/2015   BASOPCT 1 04/03/2015    CMP:  Lab Results  Component Value Date   NA 135 04/07/2015   K 3.5 04/07/2015   CL 102 04/07/2015   CO2 26 04/07/2015   BUN 25* 04/07/2015   CREATININE 1.24 04/07/2015   PROT 7.1 04/05/2015   ALBUMIN 2.5* 04/05/2015   BILITOT 1.6* 04/05/2015   ALKPHOS 740* 04/05/2015   AST 829* 04/05/2015   ALT 558* 04/05/2015  .   Total Time in preparing paper work, data evaluation and todays exam - 35 minutes  Kevion Fatheree M.D on 04/08/2015 at 11:27 AM  [facility]   Office  [phone]

## 2015-04-08 NOTE — Care Management Note (Signed)
Case Management Note  Patient Details  Name: [name] MRN: [id number] Date of Birth: [birth year]                   Action/Plan: Discharge to SNF  Expected Discharge Date:  04/08/15               Expected Discharge Plan:  Skilled Nursing Facility  In-House Referral:  Clinical Social Work  Discharge planning Services  CM Consult   Status of Service:  Completed, signed off  Medicare Important Message Given:  Yes Date Medicare IM Given:  04/08/15 Medicare IM give by:  Isidoro Donning RN  CCM Date Additional Medicare IM Given:    Additional Medicare Important Message give by:     If discussed at Long Length of Stay Meetings, dates discussed:    Additional Comments: Chart reviewed. CSW arranging SNF. Scheduled dc today.   Elliot Cousin, RN 04/08/2015, 11:12 AM

## 2015-04-08 NOTE — Progress Notes (Signed)
SW on unit with packet ready for PTAR upon arrival to transport pt to [facility]. Report called to facility and given to [facility], Merchandiser, retail. Pt recently ate lunch. Awaiting PTAR to arrive.

## 2015-04-08 NOTE — Clinical Social Work Placement (Signed)
   CLINICAL SOCIAL WORK PLACEMENT  NOTE  Date:  04/08/2015  Patient Details  Name: [name] MRN: [id number] Date of Birth: 1946/08/07  Clinical Social Work is seeking post-discharge placement for this patient at the Skilled  Nursing Facility level of care (*CSW will initial, date and re-position this form in  chart as items are completed):  Yes   Patient/family provided with [facility] Clinical Social Work Department's list of facilities offering this level of care within the geographic area requested by the patient (or if unable, by the patient's family).  Yes   Patient/family informed of their freedom to choose among providers that offer the needed level of care, that participate in Medicare, Medicaid or managed care program needed by the patient, have an available bed and are willing to accept the patient.  No   Patient/family informed of [facility]'s ownership interest in [facility] and [facility], as well as of the fact that they are under no obligation to receive care at these facilities.  PASRR submitted to EDS on 04/05/15     PASRR number received on 04/05/15     Existing PASRR number confirmed on       FL2 transmitted to all facilities in geographic area requested by pt/family on 04/05/15     FL2 transmitted to all facilities within larger geographic area on       Patient informed that his/her managed care company has contracts with or will negotiate with certain facilities, including the following:        Yes   Patient/family informed of bed offers received.  Patient chooses bed at [facility]     Physician recommends and patient chooses bed at      Patient to be transferred to [facility] on 04/08/15.  Patient to be transferred to facility by ambulance     Patient family notified on 04/08/15 of transfer.  Name of family member notified:   (Mrs. Paulino Door)     PHYSICIAN       Additional Comment:     _______________________________________________ Annetta Maw, LCSW 04/08/2015, 1:56 PM

## 2015-04-08 NOTE — Progress Notes (Signed)
Assessment unchanged. PTAR here to transport pt to [facility]. Pt transported via stretcher accompanied by paramedics x 2 to meet awaiting ambulance to carry pt to facility. Report recently called to facility.

## 2015-04-10 ENCOUNTER — Encounter (HOSPITAL_COMMUNITY): Payer: Self-pay | Admitting: Gastroenterology

## 2015-04-10 ENCOUNTER — Non-Acute Institutional Stay (SKILLED_NURSING_FACILITY): Payer: Medicare Other | Admitting: Adult Health

## 2015-04-10 DIAGNOSIS — N183 Chronic kidney disease, stage 3 unspecified: Secondary | ICD-10-CM

## 2015-04-10 DIAGNOSIS — IMO0002 Reserved for concepts with insufficient information to code with codable children: Secondary | ICD-10-CM

## 2015-04-10 DIAGNOSIS — R531 Weakness: Secondary | ICD-10-CM | POA: Diagnosis not present

## 2015-04-10 DIAGNOSIS — E1165 Type 2 diabetes mellitus with hyperglycemia: Secondary | ICD-10-CM | POA: Diagnosis not present

## 2015-04-10 DIAGNOSIS — Z8673 Personal history of transient ischemic attack (TIA), and cerebral infarction without residual deficits: Secondary | ICD-10-CM

## 2015-04-10 DIAGNOSIS — K869 Disease of pancreas, unspecified: Secondary | ICD-10-CM | POA: Diagnosis not present

## 2015-04-10 DIAGNOSIS — F329 Major depressive disorder, single episode, unspecified: Secondary | ICD-10-CM

## 2015-04-10 DIAGNOSIS — R7989 Other specified abnormal findings of blood chemistry: Secondary | ICD-10-CM | POA: Diagnosis not present

## 2015-04-10 DIAGNOSIS — J309 Allergic rhinitis, unspecified: Secondary | ICD-10-CM

## 2015-04-10 DIAGNOSIS — F32A Depression, unspecified: Secondary | ICD-10-CM

## 2015-04-10 DIAGNOSIS — Z86718 Personal history of other venous thrombosis and embolism: Secondary | ICD-10-CM

## 2015-04-10 DIAGNOSIS — A047 Enterocolitis due to Clostridium difficile: Secondary | ICD-10-CM | POA: Diagnosis not present

## 2015-04-10 DIAGNOSIS — N400 Enlarged prostate without lower urinary tract symptoms: Secondary | ICD-10-CM

## 2015-04-10 DIAGNOSIS — J449 Chronic obstructive pulmonary disease, unspecified: Secondary | ICD-10-CM

## 2015-04-10 DIAGNOSIS — K2900 Acute gastritis without bleeding: Secondary | ICD-10-CM | POA: Diagnosis not present

## 2015-04-10 DIAGNOSIS — I10 Essential (primary) hypertension: Secondary | ICD-10-CM

## 2015-04-10 DIAGNOSIS — E785 Hyperlipidemia, unspecified: Secondary | ICD-10-CM

## 2015-04-10 DIAGNOSIS — E039 Hypothyroidism, unspecified: Secondary | ICD-10-CM

## 2015-04-10 DIAGNOSIS — A0472 Enterocolitis due to Clostridium difficile, not specified as recurrent: Secondary | ICD-10-CM

## 2015-04-10 DIAGNOSIS — R945 Abnormal results of liver function studies: Secondary | ICD-10-CM

## 2015-04-10 DIAGNOSIS — K2960 Other gastritis without bleeding: Secondary | ICD-10-CM

## 2015-04-11 ENCOUNTER — Emergency Department (HOSPITAL_COMMUNITY): Payer: Medicare Other

## 2015-04-11 ENCOUNTER — Non-Acute Institutional Stay (SKILLED_NURSING_FACILITY): Payer: Medicare Other | Admitting: Internal Medicine

## 2015-04-11 ENCOUNTER — Encounter (HOSPITAL_COMMUNITY): Payer: Self-pay | Admitting: *Deleted

## 2015-04-11 ENCOUNTER — Inpatient Hospital Stay (HOSPITAL_COMMUNITY)
Admission: EM | Admit: 2015-04-11 | Discharge: 2015-04-15 | DRG: 193 | Disposition: A | Discharge status: Skilled Nursing Facility | Payer: Medicare Other | Attending: Internal Medicine | Admitting: Internal Medicine

## 2015-04-11 DIAGNOSIS — O2230 Deep phlebothrombosis in pregnancy, unspecified trimester: Secondary | ICD-10-CM

## 2015-04-11 DIAGNOSIS — K7460 Unspecified cirrhosis of liver: Secondary | ICD-10-CM | POA: Diagnosis present

## 2015-04-11 DIAGNOSIS — E1165 Type 2 diabetes mellitus with hyperglycemia: Secondary | ICD-10-CM

## 2015-04-11 DIAGNOSIS — Y95 Nosocomial condition: Secondary | ICD-10-CM | POA: Diagnosis present

## 2015-04-11 DIAGNOSIS — K754 Autoimmune hepatitis: Secondary | ICD-10-CM | POA: Insufficient documentation

## 2015-04-11 DIAGNOSIS — K8020 Calculus of gallbladder without cholecystitis without obstruction: Secondary | ICD-10-CM | POA: Diagnosis present

## 2015-04-11 DIAGNOSIS — E1122 Type 2 diabetes mellitus with diabetic chronic kidney disease: Secondary | ICD-10-CM | POA: Diagnosis present

## 2015-04-11 DIAGNOSIS — I129 Hypertensive chronic kidney disease with stage 1 through stage 4 chronic kidney disease, or unspecified chronic kidney disease: Secondary | ICD-10-CM | POA: Diagnosis present

## 2015-04-11 DIAGNOSIS — N390 Urinary tract infection, site not specified: Secondary | ICD-10-CM | POA: Diagnosis present

## 2015-04-11 DIAGNOSIS — K2960 Other gastritis without bleeding: Secondary | ICD-10-CM | POA: Diagnosis present

## 2015-04-11 DIAGNOSIS — K2980 Duodenitis without bleeding: Secondary | ICD-10-CM | POA: Diagnosis present

## 2015-04-11 DIAGNOSIS — N400 Enlarged prostate without lower urinary tract symptoms: Secondary | ICD-10-CM | POA: Diagnosis present

## 2015-04-11 DIAGNOSIS — K219 Gastro-esophageal reflux disease without esophagitis: Secondary | ICD-10-CM | POA: Diagnosis present

## 2015-04-11 DIAGNOSIS — Z950 Presence of cardiac pacemaker: Secondary | ICD-10-CM

## 2015-04-11 DIAGNOSIS — R531 Weakness: Secondary | ICD-10-CM

## 2015-04-11 DIAGNOSIS — Z86718 Personal history of other venous thrombosis and embolism: Secondary | ICD-10-CM | POA: Diagnosis not present

## 2015-04-11 DIAGNOSIS — G9341 Metabolic encephalopathy: Secondary | ICD-10-CM | POA: Diagnosis present

## 2015-04-11 DIAGNOSIS — R7989 Other specified abnormal findings of blood chemistry: Secondary | ICD-10-CM | POA: Insufficient documentation

## 2015-04-11 DIAGNOSIS — E669 Obesity, unspecified: Secondary | ICD-10-CM | POA: Diagnosis present

## 2015-04-11 DIAGNOSIS — Z794 Long term (current) use of insulin: Secondary | ICD-10-CM | POA: Diagnosis not present

## 2015-04-11 DIAGNOSIS — K2900 Acute gastritis without bleeding: Secondary | ICD-10-CM

## 2015-04-11 DIAGNOSIS — E876 Hypokalemia: Secondary | ICD-10-CM | POA: Diagnosis present

## 2015-04-11 DIAGNOSIS — Z8673 Personal history of transient ischemic attack (TIA), and cerebral infarction without residual deficits: Secondary | ICD-10-CM

## 2015-04-11 DIAGNOSIS — R400 Somnolence: Secondary | ICD-10-CM | POA: Diagnosis not present

## 2015-04-11 DIAGNOSIS — E785 Hyperlipidemia, unspecified: Secondary | ICD-10-CM | POA: Diagnosis present

## 2015-04-11 DIAGNOSIS — E039 Hypothyroidism, unspecified: Secondary | ICD-10-CM | POA: Diagnosis present

## 2015-04-11 DIAGNOSIS — N183 Chronic kidney disease, stage 3 unspecified: Secondary | ICD-10-CM | POA: Diagnosis present

## 2015-04-11 DIAGNOSIS — I10 Essential (primary) hypertension: Secondary | ICD-10-CM | POA: Diagnosis present

## 2015-04-11 DIAGNOSIS — A047 Enterocolitis due to Clostridium difficile: Secondary | ICD-10-CM | POA: Diagnosis present

## 2015-04-11 DIAGNOSIS — Z6830 Body mass index (BMI) 30.0-30.9, adult: Secondary | ICD-10-CM | POA: Diagnosis not present

## 2015-04-11 DIAGNOSIS — A0472 Enterocolitis due to Clostridium difficile, not specified as recurrent: Secondary | ICD-10-CM | POA: Diagnosis present

## 2015-04-11 DIAGNOSIS — G9340 Encephalopathy, unspecified: Secondary | ICD-10-CM | POA: Diagnosis present

## 2015-04-11 DIAGNOSIS — R29898 Other symptoms and signs involving the musculoskeletal system: Secondary | ICD-10-CM | POA: Diagnosis not present

## 2015-04-11 DIAGNOSIS — F329 Major depressive disorder, single episode, unspecified: Secondary | ICD-10-CM | POA: Diagnosis present

## 2015-04-11 DIAGNOSIS — R4182 Altered mental status, unspecified: Secondary | ICD-10-CM | POA: Diagnosis present

## 2015-04-11 DIAGNOSIS — J189 Pneumonia, unspecified organism: Principal | ICD-10-CM | POA: Diagnosis present

## 2015-04-11 DIAGNOSIS — IMO0002 Reserved for concepts with insufficient information to code with codable children: Secondary | ICD-10-CM

## 2015-04-11 DIAGNOSIS — R945 Abnormal results of liver function studies: Secondary | ICD-10-CM

## 2015-04-11 LAB — COMPREHENSIVE METABOLIC PANEL
ALBUMIN: 2.4 g/dL — AB (ref 3.5–5.0)
ALK PHOS: 641 U/L — AB (ref 38–126)
ALT: 456 U/L — AB (ref 17–63)
ANION GAP: 7 (ref 5–15)
AST: 537 U/L — AB (ref 15–41)
BUN: 24 mg/dL — ABNORMAL HIGH (ref 6–20)
CHLORIDE: 98 mmol/L — AB (ref 101–111)
CO2: 28 mmol/L (ref 22–32)
Calcium: 8.9 mg/dL (ref 8.9–10.3)
Creatinine, Ser: 1.29 mg/dL — ABNORMAL HIGH (ref 0.61–1.24)
GFR calc non Af Amer: 54 mL/min — ABNORMAL LOW (ref >60)
Glucose, Bld: 213 mg/dL — ABNORMAL HIGH (ref 65–99)
Potassium: 3.1 mmol/L — ABNORMAL LOW (ref 3.5–5.1)
SODIUM: 133 mmol/L — AB (ref 135–145)
Total Bilirubin: 1.8 mg/dL — ABNORMAL HIGH (ref 0.3–1.2)
Total Protein: 7.4 g/dL (ref 6.5–8.1)

## 2015-04-11 LAB — PROTIME-INR
INR: 1.58 — AB (ref 0.00–1.49)
PROTHROMBIN TIME: 18.9 s — AB (ref 11.6–15.2)

## 2015-04-11 LAB — CBC WITH DIFFERENTIAL/PLATELET
BASOS PCT: 1 % (ref 0–1)
Basophils Absolute: 0.1 10*3/uL (ref 0.0–0.1)
EOS ABS: 0.3 10*3/uL (ref 0.0–0.7)
Eosinophils Relative: 4 % (ref 0–5)
HEMATOCRIT: 37.2 % — AB (ref 39.0–52.0)
HEMOGLOBIN: 12.7 g/dL — AB (ref 13.0–17.0)
LYMPHS ABS: 2.3 10*3/uL (ref 0.7–4.0)
Lymphocytes Relative: 33 % (ref 12–46)
MCH: 28.8 pg (ref 26.0–34.0)
MCHC: 34.1 g/dL (ref 30.0–36.0)
MCV: 84.4 fL (ref 78.0–100.0)
MONO ABS: 0.7 10*3/uL (ref 0.1–1.0)
MONOS PCT: 10 % (ref 3–12)
NEUTROS PCT: 51 % (ref 43–77)
Neutro Abs: 3.6 10*3/uL (ref 1.7–7.7)
Platelets: 146 10*3/uL — ABNORMAL LOW (ref 150–400)
RBC: 4.41 MIL/uL (ref 4.22–5.81)
RDW: 18.1 % — ABNORMAL HIGH (ref 11.5–15.5)
WBC: 7.1 10*3/uL (ref 4.0–10.5)

## 2015-04-11 LAB — LACTIC ACID, PLASMA: Lactic Acid, Venous: 0.7 mmol/L (ref 0.5–2.0)

## 2015-04-11 LAB — TYPE AND SCREEN
ABO/RH(D): O POS
ANTIBODY SCREEN: NEGATIVE

## 2015-04-11 LAB — GLUCOSE, CAPILLARY: Glucose-Capillary: 161 mg/dL — ABNORMAL HIGH (ref 65–99)

## 2015-04-11 LAB — APTT: aPTT: 42 seconds — ABNORMAL HIGH (ref 24–37)

## 2015-04-11 LAB — PROCALCITONIN: PROCALCITONIN: 1.03 ng/mL

## 2015-04-11 MED ORDER — INSULIN ASPART 100 UNIT/ML ~~LOC~~ SOLN
0.0000 [IU] | Freq: Three times a day (TID) | SUBCUTANEOUS | Status: DC
  Administered 2015-04-12: 5 [IU] via SUBCUTANEOUS
  Administered 2015-04-12: 2 [IU] via SUBCUTANEOUS
  Administered 2015-04-12 – 2015-04-13 (×2): 7 [IU] via SUBCUTANEOUS
  Administered 2015-04-13: 3 [IU] via SUBCUTANEOUS

## 2015-04-11 MED ORDER — SODIUM CHLORIDE 0.9 % IV SOLN
1000.0000 mL | INTRAVENOUS | Status: DC
  Administered 2015-04-11: 1000 mL via INTRAVENOUS

## 2015-04-11 MED ORDER — ATORVASTATIN CALCIUM 80 MG PO TABS
80.0000 mg | ORAL_TABLET | Freq: Every day | ORAL | Status: DC
  Filled 2015-04-11 (×2): qty 1

## 2015-04-11 MED ORDER — HYDRALAZINE HCL 20 MG/ML IJ SOLN
5.0000 mg | INTRAMUSCULAR | Status: DC | PRN
  Administered 2015-04-11 – 2015-04-13 (×2): 5 mg via INTRAVENOUS
  Filled 2015-04-11 (×2): qty 1

## 2015-04-11 MED ORDER — PIPERACILLIN-TAZOBACTAM 3.375 G IVPB 30 MIN: 3.3750 g | Freq: Three times a day (TID) | INTRAVENOUS | Status: DC

## 2015-04-11 MED ORDER — ALBUTEROL SULFATE (2.5 MG/3ML) 0.083% IN NEBU: 2.5000 mg | INHALATION_SOLUTION | RESPIRATORY_TRACT | Status: DC | PRN

## 2015-04-11 MED ORDER — VANCOMYCIN HCL 10 G IV SOLR
2500.0000 mg | INTRAVENOUS | Status: AC
  Administered 2015-04-11: 2500 mg via INTRAVENOUS
  Filled 2015-04-11: qty 2500

## 2015-04-11 MED ORDER — PIPERACILLIN-TAZOBACTAM 3.375 G IVPB
3.3750 g | Freq: Three times a day (TID) | INTRAVENOUS | Status: DC
  Administered 2015-04-11 – 2015-04-14 (×8): 3.375 g via INTRAVENOUS
  Filled 2015-04-11 (×9): qty 50

## 2015-04-11 MED ORDER — SODIUM CHLORIDE 0.9 % IJ SOLN
3.0000 mL | Freq: Two times a day (BID) | INTRAMUSCULAR | Status: DC
  Administered 2015-04-11 – 2015-04-14 (×3): 3 mL via INTRAVENOUS

## 2015-04-11 MED ORDER — POTASSIUM CHLORIDE 10 MEQ/100ML IV SOLN
10.0000 meq | INTRAVENOUS | Status: AC
  Administered 2015-04-11 – 2015-04-12 (×4): 10 meq via INTRAVENOUS
  Filled 2015-04-11 (×4): qty 100

## 2015-04-11 MED ORDER — AMLODIPINE BESYLATE 10 MG PO TABS
10.0000 mg | ORAL_TABLET | Freq: Every day | ORAL | Status: DC
  Administered 2015-04-12 – 2015-04-15 (×4): 10 mg via ORAL
  Filled 2015-04-11 (×5): qty 1

## 2015-04-11 MED ORDER — FAMOTIDINE 40 MG PO TABS
40.0000 mg | ORAL_TABLET | Freq: Two times a day (BID) | ORAL | Status: DC
  Administered 2015-04-12 – 2015-04-15 (×7): 40 mg via ORAL
  Filled 2015-04-11 (×9): qty 1

## 2015-04-11 MED ORDER — SODIUM CHLORIDE 0.9 % IV SOLN
1000.0000 mL | Freq: Once | INTRAVENOUS | Status: AC
  Administered 2015-04-11: 1000 mL via INTRAVENOUS

## 2015-04-11 MED ORDER — METOPROLOL SUCCINATE ER 25 MG PO TB24
25.0000 mg | ORAL_TABLET | Freq: Every day | ORAL | Status: DC
  Administered 2015-04-12: 25 mg via ORAL
  Filled 2015-04-11 (×3): qty 1

## 2015-04-11 MED ORDER — DEXTROSE 5 % IV SOLN
2.0000 g | Freq: Three times a day (TID) | INTRAVENOUS | Status: DC
  Administered 2015-04-11: 2 g via INTRAVENOUS
  Filled 2015-04-11 (×2): qty 2

## 2015-04-11 MED ORDER — IPRATROPIUM-ALBUTEROL 0.5-2.5 (3) MG/3ML IN SOLN
3.0000 mL | RESPIRATORY_TRACT | Status: DC
  Administered 2015-04-11 – 2015-04-12 (×3): 3 mL via RESPIRATORY_TRACT
  Filled 2015-04-11 (×2): qty 3

## 2015-04-11 MED ORDER — POLYVINYL ALCOHOL 1.4 % OP SOLN: 1.0000 [drp] | Freq: Every day | OPHTHALMIC | Status: DC | PRN

## 2015-04-11 MED ORDER — IPRATROPIUM-ALBUTEROL 0.5-2.5 (3) MG/3ML IN SOLN
RESPIRATORY_TRACT | Status: AC
  Filled 2015-04-11: qty 3

## 2015-04-11 MED ORDER — SACCHAROMYCES BOULARDII 250 MG PO CAPS
250.0000 mg | ORAL_CAPSULE | Freq: Two times a day (BID) | ORAL | Status: DC
  Administered 2015-04-12 – 2015-04-15 (×7): 250 mg via ORAL
  Filled 2015-04-11 (×9): qty 1

## 2015-04-11 MED ORDER — VANCOMYCIN HCL 10 G IV SOLR: 1250.0000 mg | INTRAVENOUS | Status: DC

## 2015-04-11 MED ORDER — VITAMIN D3 25 MCG (1000 UNIT) PO TABS
2000.0000 [IU] | ORAL_TABLET | Freq: Every day | ORAL | Status: DC
  Administered 2015-04-12 – 2015-04-15 (×4): 2000 [IU] via ORAL
  Filled 2015-04-11 (×4): qty 2

## 2015-04-11 MED ORDER — POLYVINYL ALCOHOL 1.4 % OP SOLN
1.0000 [drp] | Freq: Four times a day (QID) | OPHTHALMIC | Status: DC | PRN
Start: 2015-04-11 — End: 2015-04-15
  Administered 2015-04-12: 1 [drp] via OPHTHALMIC
  Filled 2015-04-11: qty 15

## 2015-04-11 MED ORDER — TAMSULOSIN HCL 0.4 MG PO CAPS
0.4000 mg | ORAL_CAPSULE | Freq: Every day | ORAL | Status: DC
  Administered 2015-04-12 – 2015-04-15 (×4): 0.4 mg via ORAL
  Filled 2015-04-11 (×4): qty 1

## 2015-04-11 MED ORDER — GUAIFENESIN-DM 100-10 MG/5ML PO SYRP: 5.0000 mL | ORAL_SOLUTION | ORAL | Status: DC | PRN

## 2015-04-11 MED ORDER — SERTRALINE HCL 100 MG PO TABS
200.0000 mg | ORAL_TABLET | Freq: Every day | ORAL | Status: DC
  Administered 2015-04-12 – 2015-04-15 (×4): 200 mg via ORAL
  Filled 2015-04-11 (×4): qty 2

## 2015-04-11 MED ORDER — LORATADINE 10 MG PO TABS
10.0000 mg | ORAL_TABLET | Freq: Every day | ORAL | Status: DC
  Administered 2015-04-12 – 2015-04-15 (×4): 10 mg via ORAL
  Filled 2015-04-11 (×4): qty 1

## 2015-04-11 MED ORDER — LEVOTHYROXINE SODIUM 200 MCG PO TABS
200.0000 ug | ORAL_TABLET | Freq: Every day | ORAL | Status: DC
  Administered 2015-04-12 – 2015-04-15 (×4): 200 ug via ORAL
  Filled 2015-04-11 (×5): qty 1

## 2015-04-11 MED ORDER — ONDANSETRON HCL 4 MG/2ML IJ SOLN: 4.0000 mg | Freq: Four times a day (QID) | INTRAMUSCULAR | Status: DC | PRN

## 2015-04-11 MED ORDER — BRIMONIDINE TARTRATE 0.2 % OP SOLN
1.0000 [drp] | Freq: Two times a day (BID) | OPHTHALMIC | Status: DC
  Administered 2015-04-12 – 2015-04-15 (×5): 1 [drp] via OPHTHALMIC
  Filled 2015-04-11 (×3): qty 5

## 2015-04-11 MED ORDER — VANCOMYCIN 50 MG/ML ORAL SOLUTION
125.0000 mg | Freq: Three times a day (TID) | ORAL | Status: DC
  Administered 2015-04-12 – 2015-04-13 (×8): 125 mg via ORAL
  Filled 2015-04-11 (×13): qty 2.5

## 2015-04-11 MED ORDER — SODIUM CHLORIDE 0.9 % IV SOLN
1250.0000 mg | INTRAVENOUS | Status: DC
  Administered 2015-04-12 – 2015-04-13 (×2): 1250 mg via INTRAVENOUS
  Filled 2015-04-11 (×2): qty 1250

## 2015-04-11 MED ORDER — ALUM & MAG HYDROXIDE-SIMETH 200-200-20 MG/5ML PO SUSP: 30.0000 mL | Freq: Four times a day (QID) | ORAL | Status: DC | PRN

## 2015-04-11 MED ORDER — INSULIN GLARGINE 100 UNIT/ML ~~LOC~~ SOLN
10.0000 [IU] | Freq: Every day | SUBCUTANEOUS | Status: DC
  Administered 2015-04-11 – 2015-04-12 (×2): 10 [IU] via SUBCUTANEOUS
  Filled 2015-04-11 (×2): qty 0.1

## 2015-04-11 MED ORDER — APIXABAN 5 MG PO TABS
5.0000 mg | ORAL_TABLET | Freq: Two times a day (BID) | ORAL | Status: DC
  Administered 2015-04-12 – 2015-04-15 (×7): 5 mg via ORAL
  Filled 2015-04-11 (×9): qty 1

## 2015-04-11 MED ORDER — ONDANSETRON HCL 4 MG PO TABS: 4.0000 mg | ORAL_TABLET | Freq: Four times a day (QID) | ORAL | Status: DC | PRN

## 2015-04-11 NOTE — ED Notes (Signed)
Bed: YY50 Expected date:  Expected time:  Means of arrival:  Comments: EMS- [facility]

## 2015-04-11 NOTE — Progress Notes (Addendum)
ANTIBIOTIC CONSULT NOTE - INITIAL  Pharmacy Consult for Vancomycin x 8 days, Rx may adjust Ceftazidime dose Indication: Pneumonia  No Known Allergies  Patient Measurements:   Height: 6'3" Weight: 117.9 kg  Vital Signs: Temp: 97.5 F (36.4 C) (06/14 1610) Temp Source: Oral (06/14 1610) BP: 115/74 mmHg (06/14 1610) Pulse Rate: 64 (06/14 1610) Intake/Output from previous day:   Intake/Output from this shift:    Labs:  Recent Labs  04/11/15 1742  WBC 7.1  HGB 12.7*  PLT 146*  CREATININE 1.29*   Estimated Creatinine Clearance: 72.7 mL/min (by C-G formula based on Cr of 1.29). No results for input(s): VANCOTROUGH, VANCOPEAK, VANCORANDOM, GENTTROUGH, GENTPEAK, GENTRANDOM, TOBRATROUGH, TOBRAPEAK, TOBRARND, AMIKACINPEAK, AMIKACINTROU, AMIKACIN in the last 72 hours.   Microbiology: Recent Results (from the past 720 hour(s))  Clostridium Difficile by PCR     Status: Abnormal   Collection Time: 04/03/15  2:19 AM  Result Value Ref Range Status   C difficile by pcr POSITIVE (A) NEGATIVE Final    Comment: CRITICAL RESULT CALLED TO, READ BACK BY AND VERIFIED WITH: Raul Del RN 9:40 04/03/15 ([email or web address]) Performed at [facility]     Medical History: Past Medical History  Diagnosis Date  . Diabetes mellitus without complication   . Hypertension   . Presence of permanent cardiac pacemaker   . Hypothyroidism   . Stroke     Assessment: 50 y/oM with PMH of DM, HTN, hypothyroidism, stroke, recently admitted to hospital for C.diff who was discharged to SNF and now presents back to ED for confusion and persistent weakness, being evaluted for possible stroke. CT of head with no acute intracranial abnormality. CXR with left lower lobe airspace consolidation. Pharmacy consulted to assist with dosing of Vancomycin and Ceftazidime for possible pneumonia.  6/14 >> Vancomycin >> 6/14 >> Ceftazidime  >>    Tmax: 99.6 WBC: WNL Renal: SCr 1.29, CrCl ~ 53 ml/min N, 73 ml/min  CG   Goal of Therapy:  Vancomycin trough level 15-20 mcg/ml  Appropriate antibiotic dosing for renal function and indication Eradication of infection  Plan:   Vancomycin 2500mg  IV x 1, then 1250mg  IV q24h x 8 days total therapy.  Plan for Vancomycin trough level at steady state.  Change Ceftazidime to 2g IV q8h.  Monitor renal function, cultures results as available, clinical course.    Greer Pickerel, PharmD, BCPS Pager: [phone] 04/11/2015 7:02 PM

## 2015-04-11 NOTE — ED Notes (Signed)
Per EMS pt coming from [facility] facility with c/o AMS, lethargy and gen.weakness, per EMS pt is A+Ox4. Pt was recently d/c'd from hospital and was diagnosed with c-diff.

## 2015-04-11 NOTE — H&P (Signed)
[facility] History and Physical  [name] ZOX:[id number] DOB: Jan 05, 1946 DOA: 04/11/2015  Referring physician: ED physician PCP: Oneal Grout, MD  Specialists:   Chief Complaint: Weakness and altered mental status  HPI: [name] is a 69 y.o. male with PMH of hypertension, hyperlipidemia, diabetes mellitus, GERD, hypothyroidism, depression, pacemaker placement, history of stroke, BPH, duodenitis, chronic kidney disease-stage III, history of DVT on Eliquis, recently being treated C. difficile colitis, who presents generalized weakness and altered mental status.  Patient was recently hospitalized from 6/5 to 04/08/15 because of C. difficile colitis. He had positive C. difficile PCR on 04/03/15. He is currently being treated with oral vancomycin (supposed to take vancomycin until 04/16/15). Patient was discharged to SNF for rehabilitation. Family states that yesterday the patient seemed to be acting normally. This morning when they saw him they all felt that he was lethargic and confused. He did not recognize some of them. He was somewhat lethargic and somnolent.  Patient was evaluated by the nursing home physician and sent to the emergency room for concerns of possible stroke. When I saw patient in ED, he is very lethargic, but oriented 3. He could answer some questions. He reports mild abdominal pain, but no more diarrhea. He moves all extremities without unilateral weakness. Does not have chest pain, cough, shortness breath. No fever or chills. Symptoms of UTI.  In ED, patient was found to have WBC 7.1, heart rate 91, temperature 96.8, stable renal function, potassium is 3.1. Abnormal liver function test, but improved when compared with that on 04/05/15. CT head negative for acute abnormalities. Chest x-ray showed left lower lobe infiltration. Patient is admitted to inpatient for further evaluation and treatment.  Where does patient live?   SNF     Can patient participate in ADLs?   None  Review of Systems:   General: no fevers, chills, no changes in body weight, has poor appetite, has fatigue HEENT: no blurry vision, hearing changes or sore throat Pulm: no dyspnea, coughing, wheezing CV: no chest pain, palpitations Abd: no nausea, vomiting, has abdominal pain, no diarrhea, constipation GU: no dysuria, burning on urination, increased urinary frequency, hematuria  Ext: no leg edema Neuro: no unilateral weakness, numbness, or tingling, no vision change or hearing loss Skin: no rash MSK: No muscle spasm, no deformity, no limitation of range of movement in spin Heme: No easy bruising.  Travel history: No recent long distant travel.  Allergy: No Known Allergies  Past Medical History  Diagnosis Date  . Diabetes mellitus without complication   . Hypertension   . Presence of permanent cardiac pacemaker   . Hypothyroidism   . Stroke   . DVT (deep vein thrombosis) in pregnancy     Past Surgical History  Procedure Laterality Date  . Insert / replace / remove pacemaker    . Tonsillectomy    . Esophagogastroduodenoscopy N/A 04/07/2015    Procedure: ESOPHAGOGASTRODUODENOSCOPY (EGD);  Surgeon: [name] Dare, MD;  Location: [facility];  Service: Endoscopy;  Laterality: N/A;    Social History:  reports that he has never smoked. He does not have any smokeless tobacco history on file. He reports that he does not drink alcohol. His drug history is not on file.  Family History:  Family History  Problem Relation Age of Onset  . Diabetes Mellitus II Sister   . CAD Sister   . Hypertension Sister      Prior to Admission medications   Medication Sig Start Date End Date Taking? Authorizing Provider  amLODipine (NORVASC) 10 MG tablet Take 1 tablet (10 mg total) by mouth daily. 04/08/15  Yes Starleen Arms, MD  apixaban (ELIQUIS) 5 MG TABS tablet Take 1 tablet (5 mg total) by mouth 2 (two) times daily. 04/08/15  Yes Leana Roe Elgergawy, MD  atorvastatin (LIPITOR) 80 MG  tablet Take 80 mg by mouth at bedtime.   Yes Historical Provider, MD  brimonidine (ALPHAGAN) 0.2 % ophthalmic solution Place 1 drop into the left eye 2 (two) times daily.   Yes Historical Provider, MD  budesonide-formoterol (SYMBICORT) 160-4.5 MCG/ACT inhaler Inhale 2 puffs into the lungs 2 (two) times daily.   Yes Historical Provider, MD  cetirizine (ZYRTEC) 10 MG tablet Take 10 mg by mouth daily.   Yes Historical Provider, MD  cholecalciferol (VITAMIN D) 1000 UNITS tablet Take 2,000 Units by mouth daily.   Yes Historical Provider, MD  hydroxypropyl methylcellulose / hypromellose (ISOPTO TEARS / GONIOVISC) 2.5 % ophthalmic solution Place 1 drop into both eyes 4 (four) times daily as needed for dry eyes (dry eyes).   Yes Historical Provider, MD  insulin aspart (NOVOLOG) 100 UNIT/ML injection Sliding Scale 70-120 (0 units) 121-150 (1 unit) 151-200(2 units) 201-250 (3 units) 251-300( 5 units)  301-350 (7 units) 351-400 (9 units) >400 call MD 04/11/15  Yes Oneal Grout, MD  insulin glargine (LANTUS) 100 UNIT/ML injection Inject 0.1 mLs (10 Units total) into the skin at bedtime. 04/08/15  Yes Starleen Arms, MD  levothyroxine (SYNTHROID, LEVOTHROID) 100 MCG tablet Take 200 mcg by mouth daily before breakfast.   Yes Historical Provider, MD  metoprolol succinate (TOPROL-XL) 50 MG 24 hr tablet Take 25 mg by mouth daily. Take with or immediately following a meal.   Yes Historical Provider, MD  polyvinyl alcohol (LIQUIFILM TEARS) 1.4 % ophthalmic solution Place 1 drop into both eyes daily as needed for dry eyes. 04/08/15  Yes Starleen Arms, MD  ranitidine (ZANTAC) 300 MG tablet Take 1 tablet (300 mg total) by mouth 2 (two) times daily. 04/08/15  Yes Starleen Arms, MD  saccharomyces boulardii (FLORASTOR) 250 MG capsule Take 1 capsule (250 mg total) by mouth 2 (two) times daily. 04/08/15  Yes Starleen Arms, MD  sertraline (ZOLOFT) 100 MG tablet Take 200 mg by mouth daily.   Yes Historical  Provider, MD  tamsulosin (FLOMAX) 0.4 MG CAPS capsule Take 1 capsule (0.4 mg total) by mouth daily. 04/08/15  Yes Starleen Arms, MD  vancomycin (VANCOCIN) 50 mg/mL oral solution Take 2.5 mLs (125 mg total) by mouth every 6 (six) hours. 04/08/15 04/16/15 Yes Starleen Arms, MD    Physical Exam: Filed Vitals:   04/11/15 1942 04/11/15 2100 04/11/15 2104 04/11/15 2211  BP: 141/113 161/76 161/76 178/98  Pulse: 91 67 67 65  Temp:    98.1 F (36.7 C)  TempSrc:    Oral  Resp: Weight:    109.135 kg (240 lb 9.6 oz)  SpO2: 96% 98% 98% 100%   General: Not in acute distress HEENT:       Eyes: PERRL, EOMI, no scleral icterus.       ENT: No discharge from the ears and nose, no pharynx injection, no tonsillar enlargement.        Neck: No JVD, no bruit, no mass felt. Heme: No neck lymph node enlargement. Cardiac: S1/S2, RRR, No murmurs, No gallops or rubs. Pulm: No rales, wheezing, rhonchi or rubs. Abd: Soft, nondistended, nontender, no rebound pain, no organomegaly, BS  present. Ext: No pitting leg edema bilaterally. 2+DP/PT pulse bilaterally. Musculoskeletal: No joint deformities, No joint redness or warmth, no limitation of ROM in spin. Skin: No rashes.  Neuro: Lethargic, but oriented X3, cranial nerves II-XII grossly intact, muscle strength 4/5 symmetric in all extremities, sensation to light touch intact. Brachial reflex 1+ bilaterally. Knee reflex 1+ bilaterally. Negative Babinski's sign. Slow finger to nose test. Psych: Patient is not psychotic, no suicidal or hemocidal ideation.  Labs on Admission:  Basic Metabolic Panel:  Recent Labs Lab 04/05/15 1008 04/06/15 0421 04/07/15 0120 04/11/15 1742  NA 135 132* 135 133*  K 4.1 3.8 3.5 3.1*  CL 103 100* 102 98*  CO2 24 27 26 28   GLUCOSE 127* 251* 138* 213*  BUN 30* 34* 25* 24*  CREATININE 1.66* 1.62* 1.24 1.29*  CALCIUM 9.0 8.8* 8.7* 8.9   Liver Function Tests:  Recent Labs Lab 04/05/15 1008 04/11/15 1742  AST  829* 537*  ALT 558* 456*  ALKPHOS 740* 641*  BILITOT 1.6* 1.8*  PROT 7.1 7.4  ALBUMIN 2.5* 2.4*   No results for input(s): LIPASE, AMYLASE in the last 168 hours. No results for input(s): AMMONIA in the last 168 hours. CBC:  Recent Labs Lab 04/06/15 0421 04/07/15 0120 04/11/15 1742  WBC 7.0 7.8 7.1  NEUTROABS  --   --  3.6  HGB 12.0* 12.4* 12.7*  HCT 36.1* 37.3* 37.2*  MCV 83.6 84.6 84.4  PLT 148* 156 146*   Cardiac Enzymes: No results for input(s): CKTOTAL, CKMB, CKMBINDEX, TROPONINI in the last 168 hours.  BNP (last 3 results) No results for input(s): BNP in the last 8760 hours.  ProBNP (last 3 results) No results for input(s): PROBNP in the last 8760 hours.  CBG:  Recent Labs Lab 04/07/15 1156 04/07/15 1624 04/07/15 2146 04/08/15 0716 04/08/15 1159  GLUCAP 107* 130* 174* 171* 231*    Radiological Exams on Admission: Dg Chest 1 View  04/11/2015   CLINICAL DATA:  Lethargy and weakness  EXAM: CHEST  1 VIEW  COMPARISON:  August 22, 2010  FINDINGS: There is airspace consolidation in the left lower lobe. Lungs elsewhere clear. Heart is upper normal in size with pulmonary vascularity within normal limits. There is atherosclerotic change in aorta. 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 No pneumothorax. No adenopathy.  IMPRESSION: Left lower lobe airspace consolidation. Followup PA and lateral chest radiographs recommended in 3-4 weeks following trial of antibiotic therapy to ensure resolution and exclude underlying malignancy.   Electronically Signed   By: Bretta Bang III M.D.   On: 04/11/2015 17:05   Ct Head Wo Contrast  04/11/2015   CLINICAL DATA:  Weakness, fatigue  EXAM: CT HEAD WITHOUT CONTRAST  TECHNIQUE: Contiguous axial images were obtained from the base of the skull through the vertex without intravenous contrast.  COMPARISON:  12/23/2009  FINDINGS: No skull fracture is noted. There is mucosal thickening with partial  opacification right ethmoid air cells. The mastoid air cells are unremarkable. No intracranial hemorrhage, mass effect or midline shift. Moderate cerebral atrophy. Mild periventricular and patchy subcortical white matter decreased attenuation probable due to chronic small vessel ischemic changes. Atherosclerotic calcifications of carotid siphon. No acute cortical infarction. No mass lesion is noted on this unenhanced scan.  IMPRESSION: No acute intracranial abnormality. Again noted cerebral atrophy. Periventricular and patchy subcortical white matter decreased attenuation probable due to chronic small vessel ischemic changes. There is mucosal thickening with partial opacification right ethmoid air cells.  Electronically Signed   By: Natasha Mead M.D.   On: 04/11/2015 16:56    EKG: Independently reviewed.  Abnormal findings:  Paced rhythm, QTC 471   Assessment/Plan Principal Problem:   HCAP (healthcare-associated pneumonia) Active Problems:   Elevated liver function tests   Weakness   Diabetes mellitus type 2, uncontrolled   Hypothyroidism   Essential hypertension   CKD (chronic kidney disease) stage 3, GFR 30-59 ml/min   Other cirrhosis of liver   Erosive gastritis   Duodenitis   C. difficile colitis   Hypokalemia   DVT (deep vein thrombosis) in pregnancy   Acute encephalopathy   HCAP (healthcare-associated pneumonia): Patient does not have chest pain, cough or shortness of breath, but chest x-ray showed left lower lobe infiltration, consistent with HCAP. Patient is not septic on admission. Hemodynamically stable. - Will admit to Telemetry Bed - IV Vancomycin and Zosyn (patient is also on oral vancomycin, discussed with pharmacist who recommended to continue oral vancomycin since it will not be absorbed from GI) - Robitussin for cough  - albuterol, Duoneb Neb prn for SOB - Urine legionella and S. pneumococcal antigen - Follow up blood culture x2, sputum culture and plus Flu pcr - will  get Procalcitonin and trend lactic acid level - IVF: 1L of NS bolus in ED, followed by 125 mL per hour of NS  Weakness: Likely due to deconditioning due to multiple comorbidities. -Treatment underlying problems, -PTOT  Acute encephalopathy: Likely due to HCAP and possible hypoxia. -Treatment HCAP as above -Frequent neuro check  C. difficile diarrhea: Improving on oral vancomycin - continue oral vancomycin to finish total of 14 days.  Elevated LFTs/autoimmune hepatitis: per discharge summary, elevated F-Actin IgG, ANA, workup suspicious for autoimmune hepatitis, US guided liver biopsy 04/06/15 by IR. Biopsies suspicious for autoimmune hepatitis, given patient C. Difficile, will hold on starting any steroidal treatment. CT showed cholelithiasis but no CBD dilation, no abdominal pain to suggest choledocholithiasis. normal HIDA scan. -avoid liver toxic medications, such as Tylenol -Follow-up liver function by CMP  Erosive gastritis and duodenitis: As evident on EGD 04/07/15,  - pepcide (no PPI given C. Difficile)  History of DVT - Transition to Eliquis.  History of CVA - Hold aspirin short-term due to evidence of erosive gastritis and duodenitis  CKD: Baseline creatinine 1.3-1.5. Her creatinine is 1.29, which is at baseline -Follow-up renal function by CMP  Hypertension - Norvasc, metoprolol  Diabetes mellitus: A1c was 8.2 on 12/25/09. Not controlled. -on Lantus and insulin sliding scale -Check A1c  Hypothyroidism: Last TSH was 1.989 on 12/24/09 -Continue home Synthroid -Check TSH  HLD: Last LDL was 47 on 12/24/09 -Continue home medications: Lipitor -Check FLP  DVT ppx: Eliquis  Code Status: Full code Family Communication: Yes, patient's nephew at bed side Disposition Plan: Admit to inpatient   Date of Service 04/11/2015    Lorretta Harp [facility] Pager [phone]  If 7PM-7AM, please contact night-coverage [email or web address] Password [facility] 04/11/2015, 11:08 PM

## 2015-04-11 NOTE — ED Provider Notes (Signed)
CSN: [id number]     Arrival date & time 04/11/15  1557 History   First MD Initiated Contact with Patient 04/11/15 1613     Chief Complaint  Patient presents with  . Weakness  . Fatigue   HPI Patient presents to the emergency room for confusion seated with persistent weakness. Patient was recently admitted to the hospital for issues associated with weakness and diarrhea ultimately felt to be related to C. difficile. The patient was discharged from the hospital to a nursing facility to recover.  Family states that yesterday the patient seemed to be acting normally. This morning when they saw him they all felt that he was lethargic and confused. Patient did not seem to be looking at his family clearly. He did not recognize some of them. He was somewhat lethargic and somnolent. Now in the emergency room he is slightly better but still not the same as he was yesterday. Patient was evaluated by the nursing home physician and sent to the emergency room for concerns of possible stroke. Past Medical History  Diagnosis Date  . Diabetes mellitus without complication   . Hypertension   . Presence of permanent cardiac pacemaker   . Hypothyroidism   . Stroke    Past Surgical History  Procedure Laterality Date  . Insert / replace / remove pacemaker    . Tonsillectomy    . Esophagogastroduodenoscopy N/A 04/07/2015    Procedure: ESOPHAGOGASTRODUODENOSCOPY (EGD);  Surgeon: [name] Dare, MD;  Location: [facility];  Service: Endoscopy;  Laterality: N/A;   Family History  Problem Relation Age of Onset  . Diabetes Mellitus II Sister   . CAD Sister   . Hypertension Sister    History  Substance Use Topics  . Smoking status: Never Smoker   . Smokeless tobacco: Not on file  . Alcohol Use: No    Review of Systems  All other systems reviewed and are negative.     Allergies  Review of patient's allergies indicates no known allergies.  Home Medications   Prior to Admission medications    Medication Sig Start Date End Date Taking? Authorizing Provider  amLODipine (NORVASC) 10 MG tablet Take 1 tablet (10 mg total) by mouth daily. 04/08/15  Yes Starleen Arms, MD  apixaban (ELIQUIS) 5 MG TABS tablet Take 1 tablet (5 mg total) by mouth 2 (two) times daily. 04/08/15  Yes Leana Roe Elgergawy, MD  atorvastatin (LIPITOR) 80 MG tablet Take 80 mg by mouth at bedtime.   Yes Historical Provider, MD  brimonidine (ALPHAGAN) 0.2 % ophthalmic solution Place 1 drop into the left eye 2 (two) times daily.   Yes Historical Provider, MD  budesonide-formoterol (SYMBICORT) 160-4.5 MCG/ACT inhaler Inhale 2 puffs into the lungs 2 (two) times daily.   Yes Historical Provider, MD  cetirizine (ZYRTEC) 10 MG tablet Take 10 mg by mouth daily.   Yes Historical Provider, MD  cholecalciferol (VITAMIN D) 1000 UNITS tablet Take 2,000 Units by mouth daily.   Yes Historical Provider, MD  hydroxypropyl methylcellulose / hypromellose (ISOPTO TEARS / GONIOVISC) 2.5 % ophthalmic solution Place 1 drop into both eyes 4 (four) times daily as needed for dry eyes (dry eyes).   Yes Historical Provider, MD  insulin aspart (NOVOLOG) 100 UNIT/ML injection Sliding Scale 70-120 (0 units) 121-150 (1 unit) 151-200(2 units) 201-250 (3 units) 251-300( 5 units)  301-350 (7 units) 351-400 (9 units) >400 call MD 04/11/15  Yes Oneal Grout, MD  insulin glargine (LANTUS) 100 UNIT/ML injection Inject 0.1 mLs (10  Units total) into the skin at bedtime. 04/08/15  Yes Starleen Arms, MD  levothyroxine (SYNTHROID, LEVOTHROID) 100 MCG tablet Take 200 mcg by mouth daily before breakfast.   Yes Historical Provider, MD  metoprolol succinate (TOPROL-XL) 50 MG 24 hr tablet Take 25 mg by mouth daily. Take with or immediately following a meal.   Yes Historical Provider, MD  polyvinyl alcohol (LIQUIFILM TEARS) 1.4 % ophthalmic solution Place 1 drop into both eyes daily as needed for dry eyes. 04/08/15  Yes Starleen Arms, MD  ranitidine  (ZANTAC) 300 MG tablet Take 1 tablet (300 mg total) by mouth 2 (two) times daily. 04/08/15  Yes Starleen Arms, MD  saccharomyces boulardii (FLORASTOR) 250 MG capsule Take 1 capsule (250 mg total) by mouth 2 (two) times daily. 04/08/15  Yes Starleen Arms, MD  sertraline (ZOLOFT) 100 MG tablet Take 200 mg by mouth daily.   Yes Historical Provider, MD  tamsulosin (FLOMAX) 0.4 MG CAPS capsule Take 1 capsule (0.4 mg total) by mouth daily. 04/08/15  Yes Starleen Arms, MD  vancomycin (VANCOCIN) 50 mg/mL oral solution Take 2.5 mLs (125 mg total) by mouth every 6 (six) hours. 04/08/15 04/16/15 Yes Dawood S Elgergawy, MD   BP 141/113 mmHg  Pulse 91  Temp(Src) 97.5 F (36.4 C) (Oral)  Resp 18  SpO2 96% Physical Exam  Constitutional: No distress.  HENT:  Head: Normocephalic and atraumatic.  Right Ear: External ear normal.  Left Ear: External ear normal.  Mouth/Throat: No oropharyngeal exudate.  Mm dry   Eyes: Conjunctivae are normal. Right eye exhibits no discharge. Left eye exhibits no discharge. No scleral icterus.  ?disconjugate gaze  Neck: Neck supple. No tracheal deviation present.  Cardiovascular: Normal rate, regular rhythm and intact distal pulses.   Pulmonary/Chest: Effort normal and breath sounds normal. No stridor. No respiratory distress. He has no wheezes. He has no rales.  Abdominal: Soft. Bowel sounds are normal. He exhibits no distension. There is no tenderness. There is no rebound and no guarding.  Musculoskeletal: He exhibits no edema or tenderness.  Neurological: He is alert. He displays tremor. No cranial nerve deficit (no facial droop, extraocular movements intact, no slurred speech) or sensory deficit. He displays no seizure activity.  Answers questions, follows commands, no facial droop, tongue midline, unable to lift legs off the bed does wiggle toes, able to lift both arms of the bed  Skin: Skin is warm and dry. No rash noted. He is not diaphoretic.  Psychiatric:  He has a normal mood and affect.  Nursing note and vitals reviewed.   ED Course  Procedures (including critical care time) Labs Review Labs Reviewed  APTT - Abnormal; Notable for the following:    aPTT 42 (*)    All other components within normal limits  COMPREHENSIVE METABOLIC PANEL - Abnormal; Notable for the following:    Sodium 133 (*)    Potassium 3.1 (*)    Chloride 98 (*)    Glucose, Bld 213 (*)    BUN 24 (*)    Creatinine, Ser 1.29 (*)    Albumin 2.4 (*)    AST 537 (*)    ALT 456 (*)    Alkaline Phosphatase 641 (*)    Total Bilirubin 1.8 (*)    GFR calc non Af Amer 54 (*)    All other components within normal limits  CBC WITH DIFFERENTIAL/PLATELET - Abnormal; Notable for the following:    Hemoglobin 12.7 (*)    HCT 37.2 (*)  RDW 18.1 (*)    Platelets 146 (*)    All other components within normal limits  PROTIME-INR - Abnormal; Notable for the following:    Prothrombin Time 18.9 (*)    INR 1.58 (*)    All other components within normal limits  URINALYSIS, ROUTINE W REFLEX MICROSCOPIC (NOT AT [facility])  TYPE AND SCREEN    Imaging Review Dg Chest 1 View  04/11/2015   CLINICAL DATA:  Lethargy and weakness  EXAM: CHEST  1 VIEW  COMPARISON:  August 22, 2010  FINDINGS: There is airspace consolidation in the left lower lobe. Lungs elsewhere clear. Heart is upper normal in size with pulmonary vascularity within normal limits. There is atherosclerotic change in aorta. 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 No pneumothorax. No adenopathy.  IMPRESSION: Left lower lobe airspace consolidation. Followup PA and lateral chest radiographs recommended in 3-4 weeks following trial of antibiotic therapy to ensure resolution and exclude underlying malignancy.   Electronically Signed   By: Bretta Bang III M.D.   On: 04/11/2015 17:05   Ct Head Wo Contrast  04/11/2015   CLINICAL DATA:  Weakness, fatigue  EXAM: CT HEAD WITHOUT CONTRAST  TECHNIQUE:  Contiguous axial images were obtained from the base of the skull through the vertex without intravenous contrast.  COMPARISON:  12/23/2009  FINDINGS: No skull fracture is noted. There is mucosal thickening with partial opacification right ethmoid air cells. The mastoid air cells are unremarkable. No intracranial hemorrhage, mass effect or midline shift. Moderate cerebral atrophy. Mild periventricular and patchy subcortical white matter decreased attenuation probable due to chronic small vessel ischemic changes. Atherosclerotic calcifications of carotid siphon. No acute cortical infarction. No mass lesion is noted on this unenhanced scan.  IMPRESSION: No acute intracranial abnormality. Again noted cerebral atrophy. Periventricular and patchy subcortical white matter decreased attenuation probable due to chronic small vessel ischemic changes. There is mucosal thickening with partial opacification right ethmoid air cells.   Electronically Signed   By: Natasha Mead M.D.   On: 04/11/2015 16:56     EKG Interpretation   Date/Time:  Tuesday April 11 2015 16:09:29 EDT Ventricular Rate:  63 PR Interval:  156 QRS Duration: 160 QT Interval:  460 QTC Calculation: 471 R Axis:   104 Text Interpretation:  Atrial-sensed ventricular-paced rhythm No further  analysis attempted due to paced rhythm No significant change since last  tracing Confirmed by Avanelle Pixley  MD-J, Ruchama Kubicek ([id number]) on 04/11/2015 5:05:45 PM      MDM   Final diagnoses:  HCAP (healthcare-associated pneumonia)  Elevated LFTs    LFTs are persistently elevated.  Some improvement with that. CXR suggests HCAP.    Will start on IV abx.  Plan on admission for further treatment.     Linwood Dibbles, MD 04/11/15 2019

## 2015-04-11 NOTE — Progress Notes (Signed)
Patient ID: [name], male   DOB: 25-Dec-1945, 69 y.o.   MRN: [id number]     [facility]  PCP: No primary care provider on file.  Code Status: Full Code   No Known Allergies  Chief Complaint  Patient presents with  . New Admit To SNF    New Admission      HPI:  69 year old patient is here for short term rehabilitation post hospital admission from 04/02/15-04/08/15 with generalized weakness and diarrhea. He is currently on antibiotic for c.diff colitis and his aspirin is on hold with recent diagnosis of erosive gastritis/ duodenitis. He is seen in his room today with his niece present first. He is alert and oriented to person and place. He follows basic commands. I was asked to reassess patient at 2:45 pm with him being confused. During this time, his wife, caregiver and niece are present along with the charge nurse. He is somnolent, responds to sternal rub and is not following command. He is unable to recognize people around him. Blood sugar reading is 248. bp check in both arms 158/80 in right arm and 160/80 in left arm. As per family, this current status is acute change compared to yesterday afternoon when patient was easily communicating.  He has PMH of old CVA, dm, ckd, hypothyroidism and has a pacemaker.  Review of Systems: unable to obtain from patient. From staff and family Constitutional: Negative for fever, chills.  HENT: Negative for congestion. Was complaining of headache this noon Respiratory: Negative for cough, shortness of breath and wheezing.   Cardiovascular: Negative for chest pain    Past Medical History  Diagnosis Date  . Diabetes mellitus without complication   . Hypertension   . Presence of permanent cardiac pacemaker   . Hypothyroidism   . Stroke    Past Surgical History  Procedure Laterality Date  . Insert / replace / remove pacemaker    . Tonsillectomy    . Esophagogastroduodenoscopy N/A 04/07/2015    Procedure:  ESOPHAGOGASTRODUODENOSCOPY (EGD);  Surgeon: [name] Dare, MD;  Location: [facility];  Service: Endoscopy;  Laterality: N/A;   Social History:   reports that he has never smoked. He does not have any smokeless tobacco history on file. He reports that he does not drink alcohol. His drug history is not on file.  Family History  Problem Relation Age of Onset  . Diabetes Mellitus II Sister   . CAD Sister   . Hypertension Sister     Medications: Patient's Medications  New Prescriptions   No medications on file  Previous Medications   AMLODIPINE (NORVASC) 10 MG TABLET    Take 1 tablet (10 mg total) by mouth daily.   APIXABAN (ELIQUIS) 5 MG TABS TABLET    Take 1 tablet (5 mg total) by mouth 2 (two) times daily.   ATORVASTATIN (LIPITOR) 80 MG TABLET    Take 80 mg by mouth at bedtime.   BRIMONIDINE (ALPHAGAN) 0.2 % OPHTHALMIC SOLUTION    Place 1 drop into the left eye 2 (two) times daily.   BUDESONIDE-FORMOTEROL (SYMBICORT) 160-4.5 MCG/ACT INHALER    Inhale 2 puffs into the lungs 2 (two) times daily.   CETIRIZINE (ZYRTEC) 10 MG TABLET    Take 10 mg by mouth daily.   CHOLECALCIFEROL (VITAMIN D) 1000 UNITS TABLET    Take 2,000 Units by mouth daily.   HYDROXYPROPYL METHYLCELLULOSE / HYPROMELLOSE (ISOPTO TEARS / GONIOVISC) 2.5 % OPHTHALMIC SOLUTION    Place 1 drop  into both eyes 4 (four) times daily as needed for dry eyes (dry eyes).   INSULIN GLARGINE (LANTUS) 100 UNIT/ML INJECTION    Inject 0.1 mLs (10 Units total) into the skin at bedtime.   LEVOTHYROXINE (SYNTHROID, LEVOTHROID) 100 MCG TABLET    Take 200 mcg by mouth daily before breakfast.   METOPROLOL SUCCINATE (TOPROL-XL) 50 MG 24 HR TABLET    Take 25 mg by mouth daily. Take with or immediately following a meal.   POLYVINYL ALCOHOL (LIQUIFILM TEARS) 1.4 % OPHTHALMIC SOLUTION    Place 1 drop into both eyes daily as needed for dry eyes.   RANITIDINE (ZANTAC) 300 MG TABLET    Take 1 tablet (300 mg total) by mouth 2 (two) times daily.    SACCHAROMYCES BOULARDII (FLORASTOR) 250 MG CAPSULE    Take 1 capsule (250 mg total) by mouth 2 (two) times daily.   SERTRALINE (ZOLOFT) 100 MG TABLET    Take 200 mg by mouth daily.   TAMSULOSIN (FLOMAX) 0.4 MG CAPS CAPSULE    Take 1 capsule (0.4 mg total) by mouth daily.   VANCOMYCIN (VANCOCIN) 50 MG/ML ORAL SOLUTION    Take 2.5 mLs (125 mg total) by mouth every 6 (six) hours.  Modified Medications   Modified Medication Previous Medication   INSULIN ASPART (NOVOLOG) 100 UNIT/ML INJECTION insulin aspart (NOVOLOG) 100 UNIT/ML injection      Sliding Scale 70-120 (0 units) 121-150 (1 unit) 151-200(2 units) 201-250 (3 units) 251-300( 5 units)  301-350 (7 units) 351-400 (9 units) >400 call MD    Inject 0-9 Units into the skin 3 (three) times daily with meals.  Discontinued Medications   No medications on file     Physical Exam Filed Vitals:   04/11/15 1048  BP: 161/79  Pulse: 74  Temp: 96.8 F (36 C)  TempSrc: Oral  Resp: 18  Height:  (1.905 m)  Weight: 260 lb (117.935 kg)  SpO2: 98%    General- elderly male, obese, in no acute distress Head- normocephalic, atraumatic Throat- dry mucus membrane Eyes- no pallor, no icterus, no discharge, normal conjunctiva, normal sclera Neck- no cervical lymphadenopathy Cardiovascular- normal s1,s2, no murmurs Respiratory- poor inspiratory effort, no wheeze or rhonchi, no use of accessory muscles Abdomen- bowel sounds present, soft, non tender Musculoskeletal- unable to move right arm and both legs, able to move left arm some but not following commands, so difficult to assess strength. As per family pt had residual right sided weakness post old cva but left sided weakness on LE is new since this admission Neurological- altered mental state Skin- warm and dry   Labs reviewed: Basic Metabolic Panel:  Recent Labs  16/10/96 1008 04/06/15 0421 04/07/15 0120  NA 135 132* 135  K 4.1 3.8 3.5  CL 103 100* 102  CO2 GLUCOSE  127* 251* 138*  BUN 30* 34* 25*  CREATININE 1.66* 1.62* 1.24  CALCIUM 9.0 8.8* 8.7*   Liver Function Tests:  Recent Labs  04/03/15 0650 04/04/15 0512 04/05/15 1008  AST 795* 744* 829*  ALT 547* 508* 558*  ALKPHOS 601* 668* 740*  BILITOT 1.1 1.1 1.6*  PROT 6.9 6.8 7.1  ALBUMIN 2.5* 2.3* 2.5*    Recent Labs  04/02/15 2119  LIPASE 15*   No results for input(s): AMMONIA in the last 8760 hours. CBC:  Recent Labs  04/02/15 2119 04/03/15 0650 04/06/15 0421 04/07/15 0120  WBC 5.7 6.6 7.0 7.8  NEUTROABS 2.6 2.8  --   --  HGB 13.7 13.1 12.0* 12.4*  HCT 41.1 40.1 36.1* 37.3*  MCV 86.0 85.5 83.6 84.6  PLT 166 167 148* 156   Cardiac Enzymes:  Recent Labs  04/03/15 0650 04/04/15 1123 04/04/15 1655 04/04/15 2227  CKTOTAL 45*  --   --   --   TROPONINI 0.41* 0.40* 0.38* 0.38*   BNP: Invalid input(s): POCBNP CBG:  Recent Labs  04/07/15 2146 04/08/15 0716 04/08/15 1159  GLUCAP 174* 171* 231*    Radiological Exams:  04/03/15 Ct abdomen/pelvis IMPRESSION: 1. Minimal colonic wall thickening involving the sigmoid colon, may reflect mild colitis. 2. Ventral abdominal wall hernia containing normal appearing loops of small bowel without bowel compromise or obstruction. 3. Nodular hepatic contours, suggestive of underlying cirrhosis. No focal hepatic lesion. 4. Small round hypodensities within the pancreas, largest measuring 1.5 cm. 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 In the absence of prior imaging, nonemergent MRCP could be considered if patient is able tolerate breath hold technique. 5. Cholelithiasis without cholecystitis. 6. Bilateral renal cysts. In addition there is a left upper pole 1.5 cm lesion measuring higher than simple fluid density. This may reflect a complex or hemorrhagic cyst. Correlation with any prior imaging would be most helpful. In the absence of prior imaging, correlation with  contrast-enhanced MRI is recommended.     04/04/15 NM hepatobiliary imaging IMPRESSION: 1. Normal examination. No findings to suggest cholecystitis or biliary tract obstruction.     Assessment/Plan  Altered mental status New onset with somnolence and disorientation. Has possible history of dementia but was AAO X 3 as per wife and caregiver. This is a new change. This am he was oriented to person and place but not to time. Now, he is disoriented. Normal blood sugar reading, no hypoglycemia. bp elevated. Possible hypertensive emergency vs acute CVA. Has hx of CVA in the past. No recent brain imaging. Send pt to ED for stroke workup.  Lower extremity weakness Bilateral lower extremity weakness. Minimal participation with therapy. Was using walker and getting around prior to recent hospital admission. Will need to rule out cord compression and stroke. Sending pt to ED    Family/ staff Communication: reviewed care plan with patient's family and nursing supervisor  Spent more than 50 minute in patient care and co-ordination   Oneal Grout, MD  [facility] [phone] (Monday-Friday 8 am - 5 pm) [phone] (afterhours)

## 2015-04-12 DIAGNOSIS — K754 Autoimmune hepatitis: Secondary | ICD-10-CM

## 2015-04-12 LAB — LACTIC ACID, PLASMA: LACTIC ACID, VENOUS: 1.1 mmol/L (ref 0.5–2.0)

## 2015-04-12 LAB — COMPREHENSIVE METABOLIC PANEL
ALT: 454 U/L — ABNORMAL HIGH (ref 17–63)
AST: 564 U/L — ABNORMAL HIGH (ref 15–41)
Albumin: 2.5 g/dL — ABNORMAL LOW (ref 3.5–5.0)
Alkaline Phosphatase: 569 U/L — ABNORMAL HIGH (ref 38–126)
Anion gap: 7 (ref 5–15)
BUN: 20 mg/dL (ref 6–20)
CHLORIDE: 100 mmol/L — AB (ref 101–111)
CO2: 26 mmol/L (ref 22–32)
CREATININE: 1.23 mg/dL (ref 0.61–1.24)
Calcium: 8.8 mg/dL — ABNORMAL LOW (ref 8.9–10.3)
GFR, EST NON AFRICAN AMERICAN: 57 mL/min — AB (ref >60)
Glucose, Bld: 205 mg/dL — ABNORMAL HIGH (ref 65–99)
Potassium: 3.5 mmol/L (ref 3.5–5.1)
SODIUM: 133 mmol/L — AB (ref 135–145)
TOTAL PROTEIN: 7.4 g/dL (ref 6.5–8.1)
Total Bilirubin: 2.3 mg/dL — ABNORMAL HIGH (ref 0.3–1.2)

## 2015-04-12 LAB — LIPID PANEL
CHOL/HDL RATIO: 7.1 ratio
Cholesterol: 92 mg/dL (ref 0–200)
HDL: 13 mg/dL — AB (ref >40)
LDL Cholesterol: 60 mg/dL (ref 0–99)
Triglycerides: 97 mg/dL (ref <150)
VLDL: 19 mg/dL (ref 0–40)

## 2015-04-12 LAB — CBC
HCT: 37.6 % — ABNORMAL LOW (ref 39.0–52.0)
Hemoglobin: 12.5 g/dL — ABNORMAL LOW (ref 13.0–17.0)
MCH: 27.9 pg (ref 26.0–34.0)
MCHC: 33.2 g/dL (ref 30.0–36.0)
MCV: 83.9 fL (ref 78.0–100.0)
PLATELETS: 153 10*3/uL (ref 150–400)
RBC: 4.48 MIL/uL (ref 4.22–5.81)
RDW: 18.5 % — ABNORMAL HIGH (ref 11.5–15.5)
WBC: 7.5 10*3/uL (ref 4.0–10.5)

## 2015-04-12 LAB — URINALYSIS, ROUTINE W REFLEX MICROSCOPIC
Bilirubin Urine: NEGATIVE
GLUCOSE, UA: NEGATIVE mg/dL
KETONES UR: NEGATIVE mg/dL
Nitrite: POSITIVE — AB
Protein, ur: 100 mg/dL — AB
Specific Gravity, Urine: 1.012 (ref 1.005–1.030)
Urobilinogen, UA: 0.2 mg/dL (ref 0.0–1.0)
pH: 6.5 (ref 5.0–8.0)

## 2015-04-12 LAB — INFLUENZA PANEL BY PCR (TYPE A & B)
H1N1FLUPCR: NOT DETECTED
Influenza A By PCR: NEGATIVE
Influenza B By PCR: NEGATIVE

## 2015-04-12 LAB — GLUCOSE, CAPILLARY
GLUCOSE-CAPILLARY: 277 mg/dL — AB (ref 65–99)
GLUCOSE-CAPILLARY: 313 mg/dL — AB (ref 65–99)
Glucose-Capillary: 181 mg/dL — ABNORMAL HIGH (ref 65–99)

## 2015-04-12 LAB — STREP PNEUMONIAE URINARY ANTIGEN: STREP PNEUMO URINARY ANTIGEN: NEGATIVE

## 2015-04-12 LAB — LEGIONELLA ANTIGEN, URINE

## 2015-04-12 LAB — MRSA PCR SCREENING: MRSA by PCR: NEGATIVE

## 2015-04-12 LAB — MAGNESIUM: Magnesium: 1.8 mg/dL (ref 1.7–2.4)

## 2015-04-12 LAB — URINE MICROSCOPIC-ADD ON

## 2015-04-12 LAB — TSH: TSH: 1.565 u[IU]/mL (ref 0.350–4.500)

## 2015-04-12 MED ORDER — IPRATROPIUM-ALBUTEROL 0.5-2.5 (3) MG/3ML IN SOLN
3.0000 mL | Freq: Three times a day (TID) | RESPIRATORY_TRACT | Status: DC
  Administered 2015-04-12 – 2015-04-14 (×5): 3 mL via RESPIRATORY_TRACT
  Filled 2015-04-12 (×5): qty 3

## 2015-04-12 MED ORDER — ENSURE ENLIVE PO LIQD
237.0000 mL | Freq: Two times a day (BID) | ORAL | Status: DC
  Administered 2015-04-12 – 2015-04-13 (×2): 237 mL via ORAL

## 2015-04-12 NOTE — Evaluation (Addendum)
Occupational Therapy Evaluation Patient Details Name: [name] MRN: [id number] DOB: 1946/08/28 Today's Date: 04/12/2015    History of Present Illness 69 yo male admitted with Pna, weakness, AMS. Hx of CVA with residual weakness. Pt is from SNF   Clinical Impression   Pt lethargic during session and only minimal verbalizations. Rolled R and L for hygiene with minimal assist by pt noted. Currently +2 total for rolling. Per chart, pt at SNF for rehab. Will follow on acute to progress ADL independence for return to SNF.    Follow Up Recommendations  Supervision/Assistance - 24 hour;SNF    Equipment Recommendations  None recommended by OT    Recommendations for Other Services       Precautions / Restrictions Precautions Precautions: Fall Restrictions Weight Bearing Restrictions: No      Mobility Bed Mobility Overal bed mobility: Needs Assistance Bed Mobility: Rolling Rolling: Total assist;+2 for physical assistance;+2 for safety/equipment         General bed mobility comments: Rolling to L, R x2 for hygiene and to assess bed mobility. Very little initiation of task despite multimodal cueing. Utilized bedpad for positioning  Transfers                 General transfer comment: NT-pt unable    Balance                                            ADL       Grooming: Wash/dry face;Total assistance;Bed level                                 General ADL Comments: Total assist +2 for rolling in bed for periarea hygiene as pt had a BM. pt did minimally help with reaching and gripping to the rail to roll R and L. Pt lethargic but did open eyes and keep them open during the rolling task. Pt grimaced when rolled and also with cleaning periarea. Unsure of baseline at [facility].      Vision     Perception     Praxis      Pertinent Vitals/Pain Pain Assessment: Faces Pain Score: 4  Faces Pain Scale: Hurts little more Pain Location:  when rolling Pain Descriptors / Indicators: Grimacing Pain Intervention(s): Monitored during session;Repositioned     Hand Dominance     Extremity/Trunk Assessment Upper Extremity Assessment Upper Extremity Assessment: Defer to OT evaluation RUE Deficits / Details: pt not following commands well enough for full MMT and ROM. pt did squeeze therapist's hand to command minimally. Noted tightness with PROM in shoulder, elbow, hand LUE Deficits / Details: as above. Pt noted to have tightness with PROM in shoulder, elbow, hand.          Communication Communication Communication: Expressive difficulties   Cognition Arousal/Alertness: Lethargic Behavior During Therapy: Flat affect Overall Cognitive Status: No family/caregiver present to determine baseline cognitive functioning         Following Commands: Follows one step commands inconsistently           General Comments       Exercises       Shoulder Instructions      Home Living Family/patient expects to be discharged to:: Skilled nursing facility  Prior Functioning/Environment Level of Independence: Needs assistance             OT Diagnosis: Generalized weakness   OT Problem List: Decreased strength;Decreased knowledge of use of DME or AE;Decreased cognition   OT Treatment/Interventions: Self-care/ADL training;Patient/family education;Therapeutic activities;DME and/or AE instruction    OT Goals(Current goals can be found in the care plan section) Acute Rehab OT Goals Patient Stated Goal: none stated. pt did not say much during session OT Goal Formulation: Patient unable to participate in goal setting Time For Goal Achievement: 04/26/15 Potential to Achieve Goals: Fair ADL Goals Pt Will Perform Grooming: with min assist;bed level Pt Will Perform Upper Body Bathing: with min assist;bed level Additional ADL Goal #1: Pt will roll for hygiene with mod  assist.   OT Frequency: Min 2X/week   Barriers to D/C:            Co-evaluation PT/OT/SLP Co-Evaluation/Treatment: Yes Reason for Co-Treatment: For patient/therapist safety PT goals addressed during session: Mobility/safety with mobility OT goals addressed during session: ADL's and self-care;Proper use of Adaptive equipment and DME      End of Session    Activity Tolerance: Other (comment) (cognition) Patient left: in bed;with call bell/phone within reach   Time: 8563-1497 OT Time Calculation (min): 19 min Charges:  OT General Charges $OT Visit: 1 Procedure OT Evaluation $Initial OT Evaluation Tier I: 1 Procedure G-Codes:    [name]  [phone] 04/12/2015, 12:35 PM

## 2015-04-12 NOTE — Progress Notes (Signed)
[facility] PROGRESS NOTE  [name] ZOX:[id number] DOB: 1946/01/26 DOA: 04/11/2015 PCP: Oneal Grout, MD  Assessment/Plan: #1 healthcare associated pneumonia Per chest x-ray. Patient also with acute encephalopathy. Patient improving clinically. Urine Legionella antigen is negative. Urine pneumococcus antigen is negative. Blood cultures pending. Continue empiric IV vancomycin and IV Zosyn. Continue nebulizer treatments.  #2 probable urinary tract infection Check urine cultures. Continue IV Zosyn.  #3 acute encephalopathy Likely metabolic encephalopathy secondary to problem #1 and 2. Patient with clinical improvement. CT head negative. Patient with no focal neurological deficits. Continue empiric IV antibiotics for now. Follow.  #4 C. difficile colitis Continue oral vancomycin to complete a 14 day course of therapy her prior hospitalization.  #5 weakness PT/OT.  #6 transaminitis/elevated LFTs/autoimmune hepatitis Per discharge summary elevated F/acting IgG, and a workup suspicious for immune hepatitis. Ultrasound-guided liver biopsy on 04/06/2015 consistent with autoimmune hepatitis. Will hold off on IV steroids at this time. CT showed cholelithiasis but no, bile duct dilatation. Patient denies any abdominal pain. Hiatus count was normal. LFTs slowly trending down. Avoid liver toxic medications. Will need outpatient follow-up.  #7 erosive gastritis and duodenitis The EGD on 04/07/2015. Continue Pepcid.  #8 history of DVT Continue Eliquis.  #9 history of CVA Aspirin on hold. Patient on eliquis.  #10 chronic kidney disease Stable.  #11 hypertension Continue current regimen on Norvasc and metoprolol.  #12 diabetes mellitus Hemoglobin A1c pending. Continue Lantus and sliding scale insulin.  #13 hypothyroidism TSH at 1.565. Continue Synthroid.  #14 hyperlipidemia Discontinue statin secondary to transaminitis.  #15 prophylaxis Pepcid for GI prophylaxis.Eliquis for  DVT prophylaxis.  Code Status: Full Family Communication: Updated patient and wife at bedside. Disposition Plan: To skilled nursing facility when medically stable.   Consultants:  None  Procedures:  CT Head 04/11/15  CXR 04/11/15  Antibiotics:  IV vancomycin 04/11/2015  IV Zosyn 04/11/2015  HPI/Subjective: Patient alert to self place and year. Patient denies any abdominal pain. Patient denies any nausea. Patient denies any vomiting.  Objective: Filed Vitals:   04/12/15 0640  BP: 175/91  Pulse: 66  Temp: 98 F (36.7 C)  Resp: 18    Intake/Output Summary (Last 24 hours) at 04/12/15 1513 Last data filed at 04/12/15 0641  Gross per 24 hour  Intake 979.17 ml  Output   1000 ml  Net -20.83 ml   Filed Weights   04/11/15 2211  Weight: 109.135 kg (240 lb 9.6 oz)    Exam:   General:  NAD  Cardiovascular: RRR  Respiratory: CTAB anterior lung fields   Abdomen: Soft, nontender, nondistended, positive bowel sounds.   Musculoskeletal: No clubbing cyanosis or edema.  Data Reviewed: Basic Metabolic Panel:  Recent Labs Lab 04/06/15 0421 04/07/15 0120 04/11/15 1742 04/12/15 0719  NA 132* 135 133* 133*  K 3.8 3.5 3.1* 3.5  CL 100* 102 98* 100*  CO2 GLUCOSE 251* 138* 213* 205*  BUN 34* 25* 24* 20  CREATININE 1.62* 1.24 1.29* 1.23  CALCIUM 8.8* 8.7* 8.9 8.8*  MG  --   --   --  1.8   Liver Function Tests:  Recent Labs Lab 04/11/15 1742 04/12/15 0719  AST 537* 564*  ALT 456* 454*  ALKPHOS 641* 569*  BILITOT 1.8* 2.3*  PROT 7.4 7.4  ALBUMIN 2.4* 2.5*   No results for input(s): LIPASE, AMYLASE in the last 168 hours. No results for input(s): AMMONIA in the last 168 hours. CBC:  Recent Labs Lab 04/06/15 0421 04/07/15 0120 04/11/15  1742 04/12/15 0719  WBC 7.0 7.8 7.1 7.5  NEUTROABS  --   --  3.6  --   HGB 12.0* 12.4* 12.7* 12.5*  HCT 36.1* 37.3* 37.2* 37.6*  MCV 83.6 84.6 84.4 83.9  PLT 148* 156 146* 153   Cardiac Enzymes: No  results for input(s): CKTOTAL, CKMB, CKMBINDEX, TROPONINI in the last 168 hours. BNP (last 3 results) No results for input(s): BNP in the last 8760 hours.  ProBNP (last 3 results) No results for input(s): PROBNP in the last 8760 hours.  CBG:  Recent Labs Lab 04/08/15 0716 04/08/15 1159 04/11/15 2318 04/12/15 0718 04/12/15 1208  GLUCAP 171* 231* 161* 181* 277*    Recent Results (from the past 240 hour(s))  Clostridium Difficile by PCR     Status: Abnormal   Collection Time: 04/03/15  2:19 AM  Result Value Ref Range Status   C difficile by pcr POSITIVE (A) NEGATIVE Final    Comment: CRITICAL RESULT CALLED TO, READ BACK BY AND VERIFIED WITH: Raul Del RN 9:40 04/03/15 ([email or web address]) Performed at [facility]   MRSA PCR Screening     Status: None   Collection Time: 04/11/15 10:40 PM  Result Value Ref Range Status   MRSA by PCR NEGATIVE NEGATIVE Final    Comment:        The GeneXpert MRSA Assay (FDA approved for NASAL specimens only), is one component of a comprehensive MRSA colonization surveillance program. It is not intended to diagnose MRSA infection nor to guide or monitor treatment for MRSA infections.      Studies: Dg Chest 1 View  04/11/2015   CLINICAL DATA:  Lethargy and weakness  EXAM: CHEST  1 VIEW  COMPARISON:  August 22, 2010  FINDINGS: There is airspace consolidation in the left lower lobe. Lungs elsewhere clear. Heart is upper normal in size with pulmonary vascularity within normal limits. There is atherosclerotic change in aorta. 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 No pneumothorax. No adenopathy.  IMPRESSION: Left lower lobe airspace consolidation. Followup PA and lateral chest radiographs recommended in 3-4 weeks following trial of antibiotic therapy to ensure resolution and exclude underlying malignancy.   Electronically Signed   By: Bretta Bang III M.D.   On: 04/11/2015 17:05   Ct Head Wo Contrast  04/11/2015    CLINICAL DATA:  Weakness, fatigue  EXAM: CT HEAD WITHOUT CONTRAST  TECHNIQUE: Contiguous axial images were obtained from the base of the skull through the vertex without intravenous contrast.  COMPARISON:  12/23/2009  FINDINGS: No skull fracture is noted. There is mucosal thickening with partial opacification right ethmoid air cells. The mastoid air cells are unremarkable. No intracranial hemorrhage, mass effect or midline shift. Moderate cerebral atrophy. Mild periventricular and patchy subcortical white matter decreased attenuation probable due to chronic small vessel ischemic changes. Atherosclerotic calcifications of carotid siphon. No acute cortical infarction. No mass lesion is noted on this unenhanced scan.  IMPRESSION: No acute intracranial abnormality. Again noted cerebral atrophy. Periventricular and patchy subcortical white matter decreased attenuation probable due to chronic small vessel ischemic changes. There is mucosal thickening with partial opacification right ethmoid air cells.   Electronically Signed   By: Natasha Mead M.D.   On: 04/11/2015 16:56    Scheduled Meds: . amLODipine  10 mg Oral Daily  . apixaban  5 mg Oral BID  . brimonidine  1 drop Left Eye BID  . cholecalciferol  2,000 Units Oral Daily  . famotidine  40  mg Oral BID  . feeding supplement (ENSURE ENLIVE)  237 mL Oral BID BM  . insulin aspart  0-9 Units Subcutaneous TID WC  . insulin glargine  10 Units Subcutaneous QHS  . ipratropium-albuterol  3 mL Nebulization TID  . levothyroxine  200 mcg Oral QAC breakfast  . loratadine  10 mg Oral Daily  . metoprolol succinate  25 mg Oral Q breakfast  . piperacillin-tazobactam (ZOSYN)  IV  3.375 g Intravenous 3 times per day  . saccharomyces boulardii  250 mg Oral BID  . sertraline  200 mg Oral Daily  . sodium chloride  3 mL Intravenous Q12H  . tamsulosin  0.4 mg Oral Daily  . vancomycin  1,250 mg Intravenous Q24H  . vancomycin  125 mg Oral TID PC & HS   Continuous  Infusions: . sodium chloride 1,000 mL (04/11/15 1810)    Principal Problem:   HCAP (healthcare-associated pneumonia) Active Problems:   Elevated liver function tests   Weakness   Diabetes mellitus type 2, uncontrolled   Hypothyroidism   Essential hypertension   CKD (chronic kidney disease) stage 3, GFR 30-59 ml/min   Other cirrhosis of liver   Erosive gastritis   Duodenitis   C. difficile colitis   Hypokalemia   DVT (deep vein thrombosis) in pregnancy   Acute encephalopathy    Time spent: 40 mins    [facility] MD [facility] Pager [phone]. If 7PM-7AM, please contact night-coverage at [email or web address], password [facility] 04/12/2015, 3:13 PM  LOS: 1 day

## 2015-04-12 NOTE — Plan of Care (Signed)
Problem: Phase I Progression Outcomes Goal: Voiding-avoid urinary catheter unless indicated Outcome: Completed/Met Date Met:  04/12/15 Condom catheter

## 2015-04-12 NOTE — Care Management Note (Signed)
Case Management Note  Patient Details  Name: [name] MRN: [id number] Date of Birth: 1946-06-07  Subjective/Objective:   69 yo male admitted with HCAP and AMS                 Action/Plan:  Received phone call from Marshia Ly, [facility] transfer coordinator, requesting information on patient status and desire to transfer to the [place] to receive further care. This CM then contacted the patient's spouse, [name], (since the patient is unable to engage or answer questions) and discussed the patient remaining in [facility] for medical treatment versus transferring to the [place] and how this may affect them financially. [name] stated that she does not want there patient to be transferred to the [facility] because she does not have transportation. She stated that she rides the bus and if he transfers to [facility] she will not be able to see him. Then requested a verbal consent for refusal to transfer and have another nurse witness but [name] stated that they did not sign the form last time and she does not want to sign the form this time. This CM then contacted Marshia Ly, at the [facility] and l/m that patient spouse does not want him to transfer but refuses to sign form. [name] also stated that she prefer the patient return to [facility] on the recommendation of the patient's sisters. Will continue to follow.  Expected Discharge Date:   (unknown)               Expected Discharge Plan:  Skilled Nursing Facility  In-House Referral:  Clinical Social Work  Discharge planning Services  CM Consult  Post Acute Care Choice:    Choice offered to:     DME Arranged:    DME Agency:     HH Arranged:    HH Agency:     Status of Service:  In process, will continue to follow  Medicare Important Message Given:    Date Medicare IM Given:    Medicare IM give by:    Date Additional Medicare IM Given:    Additional Medicare Important Message give by:     If discussed at Long Length of Stay Meetings, dates  discussed:    Additional Comments:  Gerrit Halls, RN 04/12/2015, 12:23 PM

## 2015-04-12 NOTE — Clinical Social Work Note (Signed)
Clinical Social Work Assessment  Patient Details  Name: [name] MRN: [id number] Date of Birth: [birth year]  Date of referral:  04/12/15               Reason for consult:  Facility Placement                Permission sought to share information with:    Permission granted to share information::  Yes, Verbal Permission Granted  Name::      (wife- [name])  Agency::     Relationship::   (fammily/wife )  Contact Information:     Housing/Transportation Living arrangements for the past 2 months:  Skilled Building surveyor of Information:  Spouse Patient Interpreter Needed:  None Criminal Activity/Legal Involvement Pertinent to Current Situation/Hospitalization:  No - Comment as needed Significant Relationships:  Spouse Lives with:  Facility Resident Do you feel safe going back to the place where you live?  Yes Need for family participation in patient care:  Yes (Comment)  Care giving concerns:  Patient admitted from [facility].   Social Worker assessment / plan:   CSW contacted patient's wife to complete assessment as patient was sleeping and appeared too drowsy to provide much information. Per wife, patient was recently admitted from the hospital to [facility]- last week per her report. She is uncertain of dc plans at this time- when CSW inquired about disposition back to SNF, she wanted to discuss with his sisters- CSW advised her we will follow along to assist with dc needs going further. CSW will plan follow up call to wife to further discuss their wishes.  Employment status:  Retired Health and safety inspector:  Medicare PT Recommendations:  Not assessed at this time Information / Referral to community resources:  Skilled Nursing Facility  Patient/Family's Response to care: Unclear- wife appeared to have some challenges as well with my conversation and thus will follow up again to clarify their wishes and his needs at dc.   Patient/Family's Understanding of and  Emotional Response to Diagnosis, Current Treatment, and Prognosis:  Uncertain/unclear  Emotional Assessment Appearance:  Appears stated age Attitude/Demeanor/Rapport:  Lethargic, Unable to Assess Affect (typically observed):  Unable to Assess Orientation:    Alcohol / Substance use:  Not Applicable Psych involvement (Current and /or in the community):  No (Comment)  Discharge Needs  Concerns to be addressed:  Discharge Planning Concerns Readmission within the last 30 days:  Yes Current discharge risk:  None Barriers to Discharge:  No Barriers Identified   Liliana Cline, LCSW 04/12/2015, 11:23 AM

## 2015-04-12 NOTE — Progress Notes (Signed)
Initial Nutrition Assessment  DOCUMENTATION CODES:  Obesity unspecified  INTERVENTION:  Encourage PO intake RD to continue to monitor  NUTRITION DIAGNOSIS:  Inadequate oral intake related to lethargy/confusion as evidenced by meal completion < 25%.  GOAL:  Patient will meet greater than or equal to 90% of their needs  MONITOR:  PO intake, Labs, Weight trends, Skin, I & O's  REASON FOR ASSESSMENT:  Malnutrition Screening Tool    ASSESSMENT: 69 y.o. male with PMH of hypertension, hyperlipidemia, diabetes mellitus, GERD, hypothyroidism, depression, pacemaker placement, history of stroke, BPH, duodenitis, chronic kidney disease-stage III, history of DVT on Eliquis, recently being treated C. difficile colitis, who presents generalized weakness and altered mental status.  Pt in room asleep with no family present. Unable to obtain history from patient d/t lethargy. Per H&P, pt has had poor appetite and altered mental status. Pt has been ordered dysphagia 2 diet, no po documented yet. Per weight history documentation, pt has had 20 lb weight loss since 6/05 (13% weight loss x 2 weeks, significant for time frame).  Labs reviewed: Low Na Elevated LFTs CBGs: 161-181  Height:  Ht Readings from Last 1 Encounters:  04/11/15 6\' 3"  (1.905 m)    Weight:  Wt Readings from Last 1 Encounters:  04/11/15 240 lb 9.6 oz (109.135 kg)    Ideal Body Weight:  89.1 kg  Wt Readings from Last 10 Encounters:  04/11/15 240 lb 9.6 oz (109.135 kg)  04/11/15 260 lb (117.935 kg)  04/10/15 260 lb (117.935 kg)  04/02/15 260 lb (117.935 kg)    BMI:  Body mass index is 30.07 kg/(m^2).  Estimated Nutritional Needs:  Kcal:  2100-2300  Protein:  105-115g  Fluid:  2.1L/day    Skin:  Reviewed, no issues  Diet Order:  DIET DYS 2 Room service appropriate?: Yes; Fluid consistency:: Thin  EDUCATION NEEDS:  No education needs identified at this time   Intake/Output Summary (Last 24  hours) at 04/12/15 1212 Last data filed at 04/12/15 0641  Gross per 24 hour  Intake 979.17 ml  Output   1000 ml  Net -20.83 ml    Last BM:  6/15  Tilda Franco, MS, RD, LDN Pager: [phone] After Hours Pager: [phone]

## 2015-04-12 NOTE — Evaluation (Signed)
Physical Therapy Evaluation Patient Details Name: [name] MRN: [id number] DOB: Jan 31, 1946 Today's Date: 04/12/2015   History of Present Illness  69 yo male admitted with Pna, weakness, AMS. Hx of CVA with residual weakness. Pt is from SNF  Clinical Impression  On eval, pt was Total assist +2 for mobility-rolled L, R x2 during session. Very little initiation of tasks during session. Pt followed commands inconsistently. Recommend return to SNF. Will follow on trial basis.     Follow Up Recommendations SNF    Equipment Recommendations  None recommended by PT    Recommendations for Other Services OT consult     Precautions / Restrictions Precautions Precautions: Fall Restrictions Weight Bearing Restrictions: No      Mobility  Bed Mobility Overal bed mobility: Needs Assistance Bed Mobility: Rolling Rolling: Total assist;+2 for physical assistance;+2 for safety/equipment         General bed mobility comments: Rolling to L, R x2 for hygiene and to assess bed mobility. Very little initiation of task despite multimodal cueing. Utilized bedpad for positioning  Transfers                 General transfer comment: NT-pt unable  Ambulation/Gait                Stairs            Wheelchair Mobility    Modified Rankin (Stroke Patients Only)       Balance                                             Pertinent Vitals/Pain Pain Assessment: Faces Pain Score: 4  Faces Pain Scale: Hurts little more Pain Location: when rolling Pain Descriptors / Indicators: Grimacing Pain Intervention(s): Monitored during session;Repositioned    Home Living Family/patient expects to be discharged to:: Skilled nursing facility                      Prior Function Level of Independence: Needs assistance               Hand Dominance        Extremity/Trunk Assessment   Upper Extremity Assessment: Defer to OT evaluation            Lower Extremity Assessment: RLE deficits/detail;LLE deficits/detail;Difficult to assess due to impaired cognition RLE Deficits / Details: Pt with difficulty following cues for accurate assessment of strength/ROM LLE Deficits / Details: Pt with difficulty following cues for accurate assessment of strength/ROM     Communication   Communication: Expressive difficulties  Cognition Arousal/Alertness: Lethargic Behavior During Therapy: Flat affect Overall Cognitive Status: No family/caregiver present to determine baseline cognitive functioning         Following Commands: Follows one step commands inconsistently            General Comments      Exercises        Assessment/Plan    PT Assessment Patient needs continued PT services (will follow on trial basis)  PT Diagnosis Generalized weakness;Difficulty walking (chronic hemiparesisi L UE/LE)   PT Problem List Decreased mobility;Decreased strength;Decreased cognition;Decreased range of motion;Decreased coordination;Pain;Obesity;Decreased activity tolerance;Decreased knowledge of use of DME  PT Treatment Interventions Functional mobility training;Therapeutic activities;Therapeutic exercise;Patient/family education   PT Goals (Current goals can be found in the Care Plan section) Acute Rehab PT Goals Patient Stated Goal: none stated-pt  did not speak much during session PT Goal Formulation: Patient unable to participate in goal setting Time For Goal Achievement: 04/26/15 Potential to Achieve Goals: Fair    Frequency Min 2X/week   Barriers to discharge        Co-evaluation PT/OT/SLP Co-Evaluation/Treatment: Yes Reason for Co-Treatment: For patient/therapist safety PT goals addressed during session: Mobility/safety with mobility OT goals addressed during session: ADL's and self-care;Proper use of Adaptive equipment and DME       End of Session   Activity Tolerance: Patient tolerated treatment well Patient left: in  bed;with call bell/phone within reach;with nursing/sitter in room           Time: 1145-1204 PT Time Calculation (min) (ACUTE ONLY): 19 min   Charges:   PT Evaluation $Initial PT Evaluation Tier I: 1 Procedure     PT G Codes:        Rebeca Alert, MPT Pager: [phone]

## 2015-04-12 NOTE — Progress Notes (Signed)
11:00 am CM received call from [facility] [place] requesting I re-fax last admission Refusal of Transfer form, DC

## 2015-04-13 LAB — CBC
HCT: 36.7 % — ABNORMAL LOW (ref 39.0–52.0)
HEMOGLOBIN: 12.2 g/dL — AB (ref 13.0–17.0)
MCH: 27.9 pg (ref 26.0–34.0)
MCHC: 33.2 g/dL (ref 30.0–36.0)
MCV: 84 fL (ref 78.0–100.0)
PLATELETS: 131 10*3/uL — AB (ref 150–400)
RBC: 4.37 MIL/uL (ref 4.22–5.81)
RDW: 18.2 % — ABNORMAL HIGH (ref 11.5–15.5)
WBC: 6.4 10*3/uL (ref 4.0–10.5)

## 2015-04-13 LAB — HEMOGLOBIN A1C
Hgb A1c MFr Bld: 7.5 % — ABNORMAL HIGH (ref 4.8–5.6)
MEAN PLASMA GLUCOSE: 169 mg/dL

## 2015-04-13 LAB — COMPREHENSIVE METABOLIC PANEL
ALT: 386 U/L — AB (ref 17–63)
ANION GAP: 7 (ref 5–15)
AST: 496 U/L — ABNORMAL HIGH (ref 15–41)
Albumin: 2.3 g/dL — ABNORMAL LOW (ref 3.5–5.0)
Alkaline Phosphatase: 613 U/L — ABNORMAL HIGH (ref 38–126)
BILIRUBIN TOTAL: 2 mg/dL — AB (ref 0.3–1.2)
BUN: 17 mg/dL (ref 6–20)
CHLORIDE: 100 mmol/L — AB (ref 101–111)
CO2: 25 mmol/L (ref 22–32)
Calcium: 8.5 mg/dL — ABNORMAL LOW (ref 8.9–10.3)
Creatinine, Ser: 1.1 mg/dL (ref 0.61–1.24)
GFR calc Af Amer: >60 mL/min (ref >60)
GLUCOSE: 261 mg/dL — AB (ref 65–99)
POTASSIUM: 3.2 mmol/L — AB (ref 3.5–5.1)
Sodium: 132 mmol/L — ABNORMAL LOW (ref 135–145)
Total Protein: 7 g/dL (ref 6.5–8.1)

## 2015-04-13 LAB — GLUCOSE, CAPILLARY
GLUCOSE-CAPILLARY: 249 mg/dL — AB (ref 65–99)
GLUCOSE-CAPILLARY: 311 mg/dL — AB (ref 65–99)
Glucose-Capillary: 374 mg/dL — ABNORMAL HIGH (ref 65–99)
Glucose-Capillary: 316 mg/dL — ABNORMAL HIGH (ref 65–99)

## 2015-04-13 LAB — URINE CULTURE: CULTURE: NO GROWTH

## 2015-04-13 LAB — MAGNESIUM: Magnesium: 1.7 mg/dL (ref 1.7–2.4)

## 2015-04-13 MED ORDER — INSULIN ASPART 100 UNIT/ML ~~LOC~~ SOLN
0.0000 [IU] | Freq: Three times a day (TID) | SUBCUTANEOUS | Status: DC
  Administered 2015-04-13: 15 [IU] via SUBCUTANEOUS
  Administered 2015-04-14 (×3): 8 [IU] via SUBCUTANEOUS
  Administered 2015-04-15: 3 [IU] via SUBCUTANEOUS
  Administered 2015-04-15: 5 [IU] via SUBCUTANEOUS

## 2015-04-13 MED ORDER — MAGNESIUM SULFATE 50 % IJ SOLN
3.0000 g | Freq: Once | INTRAMUSCULAR | Status: AC
  Administered 2015-04-13: 3 g via INTRAVENOUS
  Filled 2015-04-13: qty 6

## 2015-04-13 MED ORDER — METOPROLOL SUCCINATE ER 50 MG PO TB24
50.0000 mg | ORAL_TABLET | Freq: Every day | ORAL | Status: DC
  Administered 2015-04-13 – 2015-04-15 (×3): 50 mg via ORAL
  Filled 2015-04-13 (×4): qty 1

## 2015-04-13 MED ORDER — INSULIN ASPART 100 UNIT/ML ~~LOC~~ SOLN
0.0000 [IU] | Freq: Every day | SUBCUTANEOUS | Status: DC
  Administered 2015-04-13: 4 [IU] via SUBCUTANEOUS
  Administered 2015-04-14: 2 [IU] via SUBCUTANEOUS

## 2015-04-13 MED ORDER — SODIUM CHLORIDE 0.9 % IV SOLN
1000.0000 mL | INTRAVENOUS | Status: DC
  Administered 2015-04-13 – 2015-04-14 (×2): 1000 mL via INTRAVENOUS

## 2015-04-13 MED ORDER — HYDRALAZINE HCL 20 MG/ML IJ SOLN: 10.0000 mg | Freq: Four times a day (QID) | INTRAMUSCULAR | Status: DC | PRN

## 2015-04-13 MED ORDER — INSULIN GLARGINE 100 UNIT/ML ~~LOC~~ SOLN
15.0000 [IU] | Freq: Every day | SUBCUTANEOUS | Status: DC
  Administered 2015-04-13: 15 [IU] via SUBCUTANEOUS
  Filled 2015-04-13: qty 0.15

## 2015-04-13 MED ORDER — POTASSIUM CHLORIDE CRYS ER 20 MEQ PO TBCR
40.0000 meq | EXTENDED_RELEASE_TABLET | Freq: Once | ORAL | Status: AC
  Administered 2015-04-13: 40 meq via ORAL
  Filled 2015-04-13: qty 2

## 2015-04-13 MED ORDER — GLUCERNA PO LIQD
237.0000 mL | Freq: Three times a day (TID) | ORAL | Status: DC
  Administered 2015-04-13: 237 mL via ORAL
  Filled 2015-04-13 (×4): qty 237

## 2015-04-13 NOTE — Progress Notes (Signed)
Inpatient Diabetes Program Recommendations  AACE/ADA: New Consensus Statement on Inpatient Glycemic Control (2013)  Target Ranges:  Prepandial:   less than 140 mg/dL      Peak postprandial:   less than 180 mg/dL (1-2 hours)      Critically ill patients:  140 - 180 mg/dL    Results for SPENSER, [id number] (MRN [id number]) as of 04/13/2015 09:56  Ref. Range 04/12/2015 07:18 04/12/2015 12:08 04/12/2015 16:58  Glucose-Capillary Latest Ref Range: 65-99 mg/dL 735 (H) 329 (H) 924 (H)    Results for BRYCIN, CARRAWAY (MRN [id number]) as of 04/13/2015 09:56  Ref. Range 04/13/2015 07:46  Glucose-Capillary Latest Ref Range: 65-99 mg/dL 229 (H)    SNF DM Meds: Lantus 10 units QHS       Novolog SSI  Current DM Orders: Lantus 15 units QHS            Novolog Sensitive SSI (0-9 units) tidwc    **Fasting glucose elevated this AM.  Note Lantus increased to 15 units QHS today.  Agree.  **Patient also still having elevated glucose levels throughout the day.    MD- Please also consider increasing Novolog SSI to Moderate scale (0-15 units) tid ac + HS     Will follow Ambrose Finland RN, MSN, CDE Diabetes Coordinator Inpatient Glycemic Control Team Team Pager: [phone] (8a-5p)

## 2015-04-13 NOTE — Progress Notes (Signed)
[facility] PROGRESS NOTE  [name] ZOX:[id number] DOB: May 02, 1946 DOA: 04/11/2015 PCP: Oneal Grout, MD  Assessment/Plan: #1 healthcare associated pneumonia Per chest x-ray. Patient also with acute encephalopathy. Patient improving clinically. Urine Legionella antigen is negative. Urine pneumococcus antigen is negative. Blood cultures pending. Continue empiric IV vancomycin and IV Zosyn. Continue nebulizer treatments.  #2 probable urinary tract infection Urine cultures pending. Continue IV Zosyn.  #3 acute encephalopathy Likely metabolic encephalopathy secondary to problem #1 and 2. Patient with clinical improvement. CT head negative. Patient with no focal neurological deficits. Continue empiric IV antibiotics for now. Follow.  #4 C. difficile colitis Continue oral vancomycin to complete a 14 day course of therapy her prior hospitalization.  #5 weakness PT/OT.  #6 transaminitis/elevated LFTs/autoimmune hepatitis Per discharge summary elevated F/acting IgG, and a workup suspicious for immune hepatitis. Ultrasound-guided liver biopsy on 04/06/2015 consistent with autoimmune hepatitis. Will hold off on IV steroids at this time. CT showed cholelithiasis but no, bile duct dilatation. Patient denies any abdominal pain. Hiatus count was normal. LFTs slowly trending down. Avoid liver toxic medications. Will need outpatient follow-up.  #7 erosive gastritis and duodenitis The EGD on 04/07/2015. Continue Pepcid.  #8 history of DVT Continue Eliquis.  #9 history of CVA Aspirin on hold. Patient on eliquis.  #10 chronic kidney disease Stable.  #11 hypertension Continue current regimen on Norvasc and metoprolol.  #12 diabetes mellitus Hemoglobin A1c is 7.5. CBGs ranged from 249-313. Increase Lantus to 15 units daily. Continue sliding scale insulin.  #13 hypothyroidism TSH at 1.565. Continue Synthroid.  #14 hyperlipidemia Discontinue statin secondary to transaminitis.  #15  prophylaxis Pepcid for GI prophylaxis.Eliquis for DVT prophylaxis.  Code Status: Full Family Communication: Updated patient. No family at bedside. Disposition Plan: To skilled nursing facility when medically stable.   Consultants:  None  Procedures:  CT Head 04/11/15  CXR 04/11/15  Antibiotics:  IV vancomycin 04/11/2015  IV Zosyn 04/11/2015  HPI/Subjective: Patient alert. Patient denies any abdominal pain. No nausea. No vomiting. Patient tolerating current diet.  Objective: Filed Vitals:   04/13/15 0816  BP: 168/84  Pulse: 60  Temp: 97.8 F (36.6 C)  Resp: 20    Intake/Output Summary (Last 24 hours) at 04/13/15 1225 Last data filed at 04/13/15 0900  Gross per 24 hour  Intake 3337.92 ml  Output   3100 ml  Net 237.92 ml   Filed Weights   04/11/15 2211  Weight: 109.135 kg (240 lb 9.6 oz)    Exam:   General:  NAD  Cardiovascular: RRR  Respiratory: CTAB anterior lung fields   Abdomen: Soft, nontender, nondistended, positive bowel sounds.   Musculoskeletal: No clubbing cyanosis or edema.  Data Reviewed: Basic Metabolic Panel:  Recent Labs Lab 04/07/15 0120 04/11/15 1742 04/12/15 0719 04/13/15 0810  NA 135 133* 133* 132*  K 3.5 3.1* 3.5 3.2*  CL 102 98* 100* 100*  CO2 GLUCOSE 138* 213* 205* 261*  BUN 25* 24* 20 17  CREATININE 1.24 1.29* 1.23 1.10  CALCIUM 8.7* 8.9 8.8* 8.5*  MG  --   --  1.8 1.7   Liver Function Tests:  Recent Labs Lab 04/11/15 1742 04/12/15 0719 04/13/15 0810  AST 537* 564* 496*  ALT 456* 454* 386*  ALKPHOS 641* 569* 613*  BILITOT 1.8* 2.3* 2.0*  PROT 7.4 7.4 7.0  ALBUMIN 2.4* 2.5* 2.3*   No results for input(s): LIPASE, AMYLASE in the last 168 hours. No results for input(s): AMMONIA in the last 168 hours.  CBC:  Recent Labs Lab 04/07/15 0120 04/11/15 1742 04/12/15 0719 04/13/15 0810  WBC 7.8 7.1 7.5 6.4  NEUTROABS  --  3.6  --   --   HGB 12.4* 12.7* 12.5* 12.2*  HCT 37.3* 37.2* 37.6*  36.7*  MCV 84.6 84.4 83.9 84.0  PLT 156 146* 153 131*   Cardiac Enzymes: No results for input(s): CKTOTAL, CKMB, CKMBINDEX, TROPONINI in the last 168 hours. BNP (last 3 results) No results for input(s): BNP in the last 8760 hours.  ProBNP (last 3 results) No results for input(s): PROBNP in the last 8760 hours.  CBG:  Recent Labs Lab 04/12/15 0718 04/12/15 1208 04/12/15 1658 04/13/15 0746 04/13/15 1203  GLUCAP 181* 277* 313* 249* 311*    Recent Results (from the past 240 hour(s))  Culture, blood (x 2)     Status: None (Preliminary result)   Collection Time: 04/11/15 10:15 PM  Result Value Ref Range Status   Specimen Description BLOOD RIGHT ARM  Final   Special Requests BOTTLES DRAWN AEROBIC AND ANAEROBIC 5CC  Final   Culture   Final           BLOOD CULTURE RECEIVED NO GROWTH TO DATE CULTURE WILL BE HELD FOR 5 DAYS BEFORE ISSUING A FINAL NEGATIVE REPORT Performed at [facility]    Report Status PENDING  Incomplete  Culture, blood (x 2)     Status: None (Preliminary result)   Collection Time: 04/11/15 10:20 PM  Result Value Ref Range Status   Specimen Description BLOOD LEFT ARM  Final   Special Requests BOTTLES DRAWN AEROBIC AND ANAEROBIC 5CC  Final   Culture   Final           BLOOD CULTURE RECEIVED NO GROWTH TO DATE CULTURE WILL BE HELD FOR 5 DAYS BEFORE ISSUING A FINAL NEGATIVE REPORT Performed at [facility]    Report Status PENDING  Incomplete  MRSA PCR Screening     Status: None   Collection Time: 04/11/15 10:40 PM  Result Value Ref Range Status   MRSA by PCR NEGATIVE NEGATIVE Final    Comment:        The GeneXpert MRSA Assay (FDA approved for NASAL specimens only), is one component of a comprehensive MRSA colonization surveillance program. It is not intended to diagnose MRSA infection nor to guide or monitor treatment for MRSA infections.      Studies: Dg Chest 1 View  04/11/2015   CLINICAL DATA:  Lethargy and weakness  EXAM: CHEST   1 VIEW  COMPARISON:  August 22, 2010  FINDINGS: There is airspace consolidation in the left lower lobe. Lungs elsewhere clear. Heart is upper normal in size with pulmonary vascularity within normal limits. There is atherosclerotic change in aorta. 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 No pneumothorax. No adenopathy.  IMPRESSION: Left lower lobe airspace consolidation. Followup PA and lateral chest radiographs recommended in 3-4 weeks following trial of antibiotic therapy to ensure resolution and exclude underlying malignancy.   Electronically Signed   By: Bretta Bang III M.D.   On: 04/11/2015 17:05   Ct Head Wo Contrast  04/11/2015   CLINICAL DATA:  Weakness, fatigue  EXAM: CT HEAD WITHOUT CONTRAST  TECHNIQUE: Contiguous axial images were obtained from the base of the skull through the vertex without intravenous contrast.  COMPARISON:  12/23/2009  FINDINGS: No skull fracture is noted. There is mucosal thickening with partial opacification right ethmoid air cells. The mastoid air cells are unremarkable.  No intracranial hemorrhage, mass effect or midline shift. Moderate cerebral atrophy. Mild periventricular and patchy subcortical white matter decreased attenuation probable due to chronic small vessel ischemic changes. Atherosclerotic calcifications of carotid siphon. No acute cortical infarction. No mass lesion is noted on this unenhanced scan.  IMPRESSION: No acute intracranial abnormality. Again noted cerebral atrophy. Periventricular and patchy subcortical white matter decreased attenuation probable due to chronic small vessel ischemic changes. There is mucosal thickening with partial opacification right ethmoid air cells.   Electronically Signed   By: Natasha Mead M.D.   On: 04/11/2015 16:56    Scheduled Meds: . amLODipine  10 mg Oral Daily  . apixaban  5 mg Oral BID  . brimonidine  1 drop Left Eye BID  . cholecalciferol  2,000 Units Oral Daily  . famotidine  40 mg  Oral BID  . feeding supplement (ENSURE ENLIVE)  237 mL Oral BID BM  . insulin aspart  0-9 Units Subcutaneous TID WC  . insulin glargine  15 Units Subcutaneous QHS  . ipratropium-albuterol  3 mL Nebulization TID  . levothyroxine  200 mcg Oral QAC breakfast  . loratadine  10 mg Oral Daily  . magnesium sulfate 1 - 4 g bolus IVPB  3 g Intravenous Once  . metoprolol succinate  50 mg Oral Q breakfast  . piperacillin-tazobactam (ZOSYN)  IV  3.375 g Intravenous 3 times per day  . saccharomyces boulardii  250 mg Oral BID  . sertraline  200 mg Oral Daily  . sodium chloride  3 mL Intravenous Q12H  . tamsulosin  0.4 mg Oral Daily  . vancomycin  1,250 mg Intravenous Q24H  . vancomycin  125 mg Oral TID PC & HS   Continuous Infusions: . sodium chloride 1,000 mL (04/13/15 1111)    Principal Problem:   HCAP (healthcare-associated pneumonia) Active Problems:   Elevated liver function tests   Weakness   Diabetes mellitus type 2, uncontrolled   Hypothyroidism   Essential hypertension   CKD (chronic kidney disease) stage 3, GFR 30-59 ml/min   Other cirrhosis of liver   Erosive gastritis   Duodenitis   C. difficile colitis   Hypokalemia   DVT (deep vein thrombosis) in pregnancy   Acute encephalopathy   Autoimmune hepatitis    Time spent: 40 mins    [facility] MD [facility] Pager [phone]. If 7PM-7AM, please contact night-coverage at [email or web address], password [facility] 04/13/2015, 12:25 PM  LOS: 2 days

## 2015-04-14 LAB — GLUCOSE, CAPILLARY
GLUCOSE-CAPILLARY: 253 mg/dL — AB (ref 65–99)
GLUCOSE-CAPILLARY: 254 mg/dL — AB (ref 65–99)
Glucose-Capillary: 258 mg/dL — ABNORMAL HIGH (ref 65–99)
Glucose-Capillary: 243 mg/dL — ABNORMAL HIGH (ref 65–99)

## 2015-04-14 LAB — CBC
HCT: 37.2 % — ABNORMAL LOW (ref 39.0–52.0)
HEMOGLOBIN: 12.8 g/dL — AB (ref 13.0–17.0)
MCH: 28.7 pg (ref 26.0–34.0)
MCHC: 34.4 g/dL (ref 30.0–36.0)
MCV: 83.4 fL (ref 78.0–100.0)
Platelets: 123 10*3/uL — ABNORMAL LOW (ref 150–400)
RBC: 4.46 MIL/uL (ref 4.22–5.81)
RDW: 17.9 % — ABNORMAL HIGH (ref 11.5–15.5)
WBC: 5.8 10*3/uL (ref 4.0–10.5)

## 2015-04-14 LAB — BASIC METABOLIC PANEL
Anion gap: 4 — ABNORMAL LOW (ref 5–15)
BUN: 16 mg/dL (ref 6–20)
CALCIUM: 8.4 mg/dL — AB (ref 8.9–10.3)
CO2: 28 mmol/L (ref 22–32)
CREATININE: 1.13 mg/dL (ref 0.61–1.24)
Chloride: 96 mmol/L — ABNORMAL LOW (ref 101–111)
GFR calc Af Amer: >60 mL/min (ref >60)
GLUCOSE: 289 mg/dL — AB (ref 65–99)
Potassium: 3.5 mmol/L (ref 3.5–5.1)
SODIUM: 128 mmol/L — AB (ref 135–145)

## 2015-04-14 LAB — MAGNESIUM: Magnesium: 1.9 mg/dL (ref 1.7–2.4)

## 2015-04-14 MED ORDER — AMOXICILLIN-POT CLAVULANATE 875-125 MG PO TABS
1.0000 | ORAL_TABLET | Freq: Two times a day (BID) | ORAL | Status: DC
  Administered 2015-04-14 – 2015-04-15 (×3): 1 via ORAL
  Filled 2015-04-14 (×4): qty 1

## 2015-04-14 MED ORDER — INSULIN ASPART 100 UNIT/ML ~~LOC~~ SOLN
4.0000 [IU] | Freq: Three times a day (TID) | SUBCUTANEOUS | Status: DC
  Administered 2015-04-14 – 2015-04-15 (×3): 4 [IU] via SUBCUTANEOUS

## 2015-04-14 MED ORDER — VANCOMYCIN 50 MG/ML ORAL SOLUTION
125.0000 mg | Freq: Three times a day (TID) | ORAL | Status: DC
  Administered 2015-04-14 – 2015-04-15 (×5): 125 mg via ORAL
  Filled 2015-04-14 (×8): qty 2.5

## 2015-04-14 MED ORDER — POTASSIUM CHLORIDE CRYS ER 20 MEQ PO TBCR: 40.0000 meq | EXTENDED_RELEASE_TABLET | ORAL | Status: DC

## 2015-04-14 MED ORDER — INSULIN GLARGINE 100 UNIT/ML ~~LOC~~ SOLN
20.0000 [IU] | Freq: Every day | SUBCUTANEOUS | Status: DC
  Administered 2015-04-14: 20 [IU] via SUBCUTANEOUS
  Filled 2015-04-14 (×2): qty 0.2

## 2015-04-14 MED ORDER — SODIUM CHLORIDE 0.9 % IV SOLN
1000.0000 mL | INTRAVENOUS | Status: DC
  Administered 2015-04-14 – 2015-04-15 (×2): 1000 mL via INTRAVENOUS

## 2015-04-14 MED ORDER — MAGNESIUM SULFATE 50 % IJ SOLN: 3.0000 g | Freq: Once | INTRAVENOUS | Status: DC

## 2015-04-14 MED ORDER — SODIUM CHLORIDE 0.9 % IV SOLN
1000.0000 mL | INTRAVENOUS | Status: DC
  Administered 2015-04-14: 1000 mL via INTRAVENOUS

## 2015-04-14 MED ORDER — GLUCERNA SHAKE PO LIQD
237.0000 mL | Freq: Three times a day (TID) | ORAL | Status: DC
  Administered 2015-04-14 – 2015-04-15 (×4): 237 mL via ORAL
  Filled 2015-04-14 (×6): qty 237

## 2015-04-14 NOTE — Progress Notes (Signed)
[facility] PROGRESS NOTE  [name] NWG:[id number] DOB: 09-Jan-1946 DOA: 04/11/2015 PCP: Oneal Grout, MD  Assessment/Plan: #1 healthcare associated pneumonia Per chest x-ray. Patient also with acute encephalopathy. Patient improving clinically. Urine Legionella antigen is negative. Urine pneumococcus antigen is negative. Blood cultures pending. Change IV vancomycin and IV Zosyn to oral Augmentin to complete course of antibiotics therapy. Continue nebulizer treatments.  #2 probable urinary tract infection Urine cultures negative. Change IV Zosyn to oral Augmentin.  #3 acute encephalopathy Likely metabolic encephalopathy secondary to problem #1 and 2. Patient with clinical improvement. CT head negative. Patient with no focal neurological deficits. Continue empiric antibiotics for now. Follow.  #4 C. difficile colitis Continue oral vancomycin to complete a 14 day course of therapy her prior hospitalization.  #5 weakness PT/OT.  #6 transaminitis/elevated LFTs/autoimmune hepatitis Per discharge summary elevated F/acting IgG, and a workup suspicious for immune hepatitis. Ultrasound-guided liver biopsy on 04/06/2015 consistent with autoimmune hepatitis. Will hold off on IV steroids at this time. CT showed cholelithiasis but no, bile duct dilatation. Patient denies any abdominal pain. Hiatus count was normal. LFTs slowly trending down. Avoid liver toxic medications. Will need outpatient follow-up.  #7 erosive gastritis and duodenitis The EGD on 04/07/2015. Continue Pepcid.  #8 history of DVT Continue Eliquis.  #9 history of CVA Aspirin on hold. Patient on eliquis.  #10 chronic kidney disease Stable.  #11 hypertension Continue current regimen on Norvasc and metoprolol.  #12 diabetes mellitus Hemoglobin A1c is 7.5. CBGs ranged from 253-258. Increase Lantus to 20 units daily. Add meal coverage insulin. Continue sliding scale insulin.  #13 hypothyroidism TSH at 1.565.  Continue Synthroid.  #14 hyperlipidemia Discontinued statin secondary to transaminitis.  #15 prophylaxis Pepcid for GI prophylaxis. Eliquis for DVT prophylaxis.  Code Status: Full Family Communication: Updated patient. No family at bedside. Disposition Plan: To skilled nursing facility when medically stable.   Consultants:  None  Procedures:  CT Head 04/11/15  CXR 04/11/15  Antibiotics:  IV vancomycin 04/11/2015>>>>>>> 04/14/2015  IV Zosyn 04/11/2015>>>>>>> 04/14/2015  Oral Augmentin 04/14/2015  HPI/Subjective: Patient alert. Patient denies any abdominal pain. No nausea. No vomiting. Patient tolerating current diet. Diarrhea has improved.  Objective: Filed Vitals:   04/14/15 0436  BP: 159/72  Pulse: 64  Temp: 98 F (36.7 C)  Resp: 19    Intake/Output Summary (Last 24 hours) at 04/14/15 1348 Last data filed at 04/14/15 0958  Gross per 24 hour  Intake 2703.75 ml  Output   2000 ml  Net 703.75 ml   Filed Weights   04/11/15 2211  Weight: 109.135 kg (240 lb 9.6 oz)    Exam:   General:  NAD  Cardiovascular: RRR  Respiratory: CTAB anterior lung fields   Abdomen: Soft, nontender, nondistended, positive bowel sounds.   Musculoskeletal: No clubbing cyanosis or edema.  Data Reviewed: Basic Metabolic Panel:  Recent Labs Lab 04/11/15 1742 04/12/15 0719 04/13/15 0810 04/14/15 0825  NA 133* 133* 132* 128*  K 3.1* 3.5 3.2* 3.5  CL 98* 100* 100* 96*  CO2 GLUCOSE 213* 205* 261* 289*  BUN 24* CREATININE 1.29* 1.23 1.10 1.13  CALCIUM 8.9 8.8* 8.5* 8.4*  MG  --  1.8 1.7 1.9   Liver Function Tests:  Recent Labs Lab 04/11/15 1742 04/12/15 0719 04/13/15 0810  AST 537* 564* 496*  ALT 456* 454* 386*  ALKPHOS 641* 569* 613*  BILITOT 1.8* 2.3* 2.0*  PROT 7.4 7.4 7.0  ALBUMIN 2.4* 2.5* 2.3*  No results for input(s): LIPASE, AMYLASE in the last 168 hours. No results for input(s): AMMONIA in the last 168  hours. CBC:  Recent Labs Lab 04/11/15 1742 04/12/15 0719 04/13/15 0810 04/14/15 0825  WBC 7.1 7.5 6.4 5.8  NEUTROABS 3.6  --   --   --   HGB 12.7* 12.5* 12.2* 12.8*  HCT 37.2* 37.6* 36.7* 37.2*  MCV 84.4 83.9 84.0 83.4  PLT 146* 153 131* 123*   Cardiac Enzymes: No results for input(s): CKTOTAL, CKMB, CKMBINDEX, TROPONINI in the last 168 hours. BNP (last 3 results) No results for input(s): BNP in the last 8760 hours.  ProBNP (last 3 results) No results for input(s): PROBNP in the last 8760 hours.  CBG:  Recent Labs Lab 04/13/15 1203 04/13/15 1657 04/13/15 2109 04/14/15 0739 04/14/15 1216  GLUCAP 311* 374* 316* 258* 253*    Recent Results (from the past 240 hour(s))  Culture, blood (x 2)     Status: None (Preliminary result)   Collection Time: 04/11/15 10:15 PM  Result Value Ref Range Status   Specimen Description BLOOD RIGHT ARM  Final   Special Requests BOTTLES DRAWN AEROBIC AND ANAEROBIC 5CC  Final   Culture   Final           BLOOD CULTURE RECEIVED NO GROWTH TO DATE CULTURE WILL BE HELD FOR 5 DAYS BEFORE ISSUING A FINAL NEGATIVE REPORT Performed at [facility]    Report Status PENDING  Incomplete  Culture, blood (x 2)     Status: None (Preliminary result)   Collection Time: 04/11/15 10:20 PM  Result Value Ref Range Status   Specimen Description BLOOD LEFT ARM  Final   Special Requests BOTTLES DRAWN AEROBIC AND ANAEROBIC 5CC  Final   Culture   Final           BLOOD CULTURE RECEIVED NO GROWTH TO DATE CULTURE WILL BE HELD FOR 5 DAYS BEFORE ISSUING A FINAL NEGATIVE REPORT Performed at [facility]    Report Status PENDING  Incomplete  MRSA PCR Screening     Status: None   Collection Time: 04/11/15 10:40 PM  Result Value Ref Range Status   MRSA by PCR NEGATIVE NEGATIVE Final    Comment:        The GeneXpert MRSA Assay (FDA approved for NASAL specimens only), is one component of a comprehensive MRSA colonization surveillance program. It is  not intended to diagnose MRSA infection nor to guide or monitor treatment for MRSA infections.   Urine culture     Status: None   Collection Time: 04/11/15 11:29 PM  Result Value Ref Range Status   Specimen Description URINE, CLEAN CATCH  Final   Special Requests NONE  Final   Culture   Final    NO GROWTH 1 DAY Performed at [facility]    Report Status 04/13/2015 FINAL  Final     Studies: No results found.  Scheduled Meds: . amLODipine  10 mg Oral Daily  . amoxicillin-clavulanate  1 tablet Oral Q12H  . apixaban  5 mg Oral BID  . brimonidine  1 drop Left Eye BID  . cholecalciferol  2,000 Units Oral Daily  . famotidine  40 mg Oral BID  . feeding supplement (GLUCERNA SHAKE)  237 mL Oral TID BM  . insulin aspart  0-15 Units Subcutaneous TID WC  . insulin aspart  0-5 Units Subcutaneous QHS  . insulin glargine  15 Units Subcutaneous QHS  . levothyroxine  200 mcg Oral  QAC breakfast  . loratadine  10 mg Oral Daily  . metoprolol succinate  50 mg Oral Q breakfast  . saccharomyces boulardii  250 mg Oral BID  . sertraline  200 mg Oral Daily  . sodium chloride  3 mL Intravenous Q12H  . tamsulosin  0.4 mg Oral Daily  . vancomycin  125 mg Oral TID PC & HS   Continuous Infusions: . sodium chloride 1,000 mL (04/14/15 1106)    Principal Problem:   HCAP (healthcare-associated pneumonia) Active Problems:   Elevated liver function tests   Weakness   Diabetes mellitus type 2, uncontrolled   Hypothyroidism   Essential hypertension   CKD (chronic kidney disease) stage 3, GFR 30-59 ml/min   Other cirrhosis of liver   Erosive gastritis   Duodenitis   C. difficile colitis   Hypokalemia   DVT (deep vein thrombosis) in pregnancy   Acute encephalopathy   Autoimmune hepatitis    Time spent: 40 mins    [facility] MD [facility] Pager [phone]. If 7PM-7AM, please contact night-coverage at [email or web address], password [facility] 04/14/2015, 1:48 PM  LOS: 3 days

## 2015-04-14 NOTE — Progress Notes (Signed)
Inpatient Diabetes Program Recommendations  AACE/ADA: New Consensus Statement on Inpatient Glycemic Control (2013)  Target Ranges:  Prepandial:   less than 140 mg/dL      Peak postprandial:   less than 180 mg/dL (1-2 hours)      Critically ill patients:  140 - 180 mg/dL   Results for [name], [name] (MRN [id number]) as of 04/14/2015 10:38  Ref. Range 04/13/2015 07:46 04/13/2015 12:03 04/13/2015 16:57 04/13/2015 21:09  Glucose-Capillary Latest Ref Range: 65-99 mg/dL 263 (H) 785 (H) 885 (H) 316 (H)   Results for [name], [name] (MRN [id number]) as of 04/14/2015 10:38  Ref. Range 04/14/2015 07:39  Glucose-Capillary Latest Ref Range: 65-99 mg/dL 867 (H)     SNF DM Meds: Lantus 10 units QHS  Novolog SSI  Current DM Orders: Lantus 15 units QHS  Novolog Moderate SSI (0-15 units) tidwc    **Fasting glucose elevated this AM. Note Lantus increased to 15 units QHS yesterday.   **Note also Novolog SSI increased to Moderate scale yesterday as well.  **Patient still having elevated glucose levels throughout the day.    MD- Please consider the following:  1. Increase Lantus to 20 units QHS  2. Add Novolog Meal Coverage- Novolog 4 units tid with meals    Will follow Ambrose Finland RN, MSN, CDE Diabetes Coordinator Inpatient Glycemic Control Team Team Pager: [phone] (8a-5p)

## 2015-04-15 LAB — CBC
HCT: 35 % — ABNORMAL LOW (ref 39.0–52.0)
Hemoglobin: 11.8 g/dL — ABNORMAL LOW (ref 13.0–17.0)
MCH: 28 pg (ref 26.0–34.0)
MCHC: 33.7 g/dL (ref 30.0–36.0)
MCV: 83.1 fL (ref 78.0–100.0)
PLATELETS: 148 10*3/uL — AB (ref 150–400)
RBC: 4.21 MIL/uL — ABNORMAL LOW (ref 4.22–5.81)
RDW: 18.1 % — AB (ref 11.5–15.5)
WBC: 7.8 10*3/uL (ref 4.0–10.5)

## 2015-04-15 LAB — COMPREHENSIVE METABOLIC PANEL
ALBUMIN: 2.2 g/dL — AB (ref 3.5–5.0)
ALK PHOS: 735 U/L — AB (ref 38–126)
ALT: 390 U/L — AB (ref 17–63)
AST: 522 U/L — AB (ref 15–41)
Anion gap: 8 (ref 5–15)
BUN: 22 mg/dL — ABNORMAL HIGH (ref 6–20)
CO2: 25 mmol/L (ref 22–32)
Calcium: 8.7 mg/dL — ABNORMAL LOW (ref 8.9–10.3)
Chloride: 100 mmol/L — ABNORMAL LOW (ref 101–111)
Creatinine, Ser: 1.31 mg/dL — ABNORMAL HIGH (ref 0.61–1.24)
GFR calc Af Amer: >60 mL/min (ref >60)
GFR calc non Af Amer: 53 mL/min — ABNORMAL LOW (ref >60)
Glucose, Bld: 226 mg/dL — ABNORMAL HIGH (ref 65–99)
POTASSIUM: 3.5 mmol/L (ref 3.5–5.1)
Sodium: 133 mmol/L — ABNORMAL LOW (ref 135–145)
TOTAL PROTEIN: 6.9 g/dL (ref 6.5–8.1)
Total Bilirubin: 1.7 mg/dL — ABNORMAL HIGH (ref 0.3–1.2)

## 2015-04-15 LAB — GLUCOSE, CAPILLARY
Glucose-Capillary: 189 mg/dL — ABNORMAL HIGH (ref 65–99)
Glucose-Capillary: 228 mg/dL — ABNORMAL HIGH (ref 65–99)

## 2015-04-15 MED ORDER — METOPROLOL SUCCINATE ER 50 MG PO TB24: 50.0000 mg | ORAL_TABLET | Freq: Every day | ORAL | Status: DC

## 2015-04-15 MED ORDER — GUAIFENESIN-DM 100-10 MG/5ML PO SYRP: 5.0000 mL | ORAL_SOLUTION | ORAL | Status: DC | PRN

## 2015-04-15 MED ORDER — GLUCERNA SHAKE PO LIQD: 237.0000 mL | Freq: Three times a day (TID) | ORAL | Status: DC

## 2015-04-15 MED ORDER — INSULIN GLARGINE 100 UNIT/ML ~~LOC~~ SOLN: 20.0000 [IU] | Freq: Every day | SUBCUTANEOUS | Status: DC

## 2015-04-15 MED ORDER — AMOXICILLIN-POT CLAVULANATE 875-125 MG PO TABS: 1.0000 | ORAL_TABLET | Freq: Two times a day (BID) | ORAL | Status: DC

## 2015-04-15 MED ORDER — POTASSIUM CHLORIDE CRYS ER 20 MEQ PO TBCR
40.0000 meq | EXTENDED_RELEASE_TABLET | Freq: Once | ORAL | Status: AC
  Administered 2015-04-15: 40 meq via ORAL
  Filled 2015-04-15: qty 2

## 2015-04-15 NOTE — Discharge Summary (Signed)
Physician Discharge Summary  [place] ZOX:[id number] DOB: 06/27/1946 DOA: 04/11/2015  PCP: Oneal Grout, MD  Admit date: 04/11/2015 Discharge date: 04/15/2015  Time spent: 65 minutes  Recommendations for Outpatient Follow-up:  1. Follow-up with M.D. at skilled nursing facility. Patient will need a basic metabolic profile done to follow-up on electrolytes and renal function in 1 week. Patient also need a CBC done to rule out on H&H in 1 week. 2. Patient will need to follow-up with gastroenterology as outpatient for his autoimmune hepatitis as previously scheduled on her prior discharge summary.  Discharge Diagnoses:  Principal Problem:   HCAP (healthcare-associated pneumonia) Active Problems:   Elevated liver function tests   Weakness   Diabetes mellitus type 2, uncontrolled   Hypothyroidism   Essential hypertension   CKD (chronic kidney disease) stage 3, GFR 30-59 ml/min   Other cirrhosis of liver   Erosive gastritis   Duodenitis   C. difficile colitis   Hypokalemia   DVT (deep vein thrombosis) in pregnancy   Acute encephalopathy   Autoimmune hepatitis   Discharge Condition: Stable and improved  Diet recommendation: Carb modified  Filed Weights   04/11/15 2211  Weight: 109.135 kg (240 lb 9.6 oz)    History of present illness:  Per Dr Saunders Glance is a 69 y.o. male with PMH of hypertension, hyperlipidemia, diabetes mellitus, GERD, hypothyroidism, depression, pacemaker placement, history of stroke, BPH, duodenitis, chronic kidney disease-stage III, history of DVT on Eliquis, recently being treated C. difficile colitis, who presented with generalized weakness and altered mental status.  Patient was recently hospitalized from 6/5 to 04/08/15 because of C. difficile colitis. He had positive C. difficile PCR on 04/03/15. He is currently being treated with oral vancomycin (supposed to take vancomycin until 04/16/15). Patient was discharged to SNF for rehabilitation. Family  stated that one day prior to admission, the patient seemed to be acting normally. On the morning of admission, when they saw him they all felt that he was lethargic and confused. He did not recognize some of them. He was somewhat lethargic and somnolent. Patient was evaluated by the nursing home physician and sent to the emergency room for concerns of possible stroke. When admitting M.D., saw patient in ED, he was very lethargic, but oriented 3. He could answer some questions. He reported mild abdominal pain, but no more diarrhea. He moved all extremities without unilateral weakness. Did not have chest pain, cough, shortness breath. No fever or chills. Symptoms of UTI.  In ED, patient was found to have WBC 7.1, heart rate 91, temperature 96.8, stable renal function, potassium is 3.1. Abnormal liver function test, but improved when compared with that on 04/05/15. CT head negative for acute abnormalities. Chest x-ray showed left lower lobe infiltration. Patient was admitted to inpatient for further evaluation and treatment.  Hospital Course:  #1 healthcare associated pneumonia Patient presented to the emergency room with altered mental status and lethargy. Chest x-ray which was done was consistent with a pneumonia. It was felt patient's acute encephalopathy was likely metabolic in nature secondary to healthcare associated pneumonia. Patient improved clinically. Urine Legionella antigen was negative. Urine pneumococcus antigen was negative. Blood cultures pending. Patient was initially placed empirically on IV vancomycin and IV Zosyn. Patient improved clinically and was subsequently transitioned to oral Augmentin. Patient be discharged on 3 more days of oral Augmentin to complete a course of antibiotics therapy.   #2 probable urinary tract infection Urine cultures negative. Patient was initially placed empirically on IV Zosyn and  subsequently transitioned to oral Augmentin. Patient was discharged on 3 more  days of oral Augmentin to complete a one-week course of antibiotics therapy.   #3 acute encephalopathy Likely metabolic encephalopathy secondary to problem #1 and 2. Patient on admission was noted to have a healthcare associated pneumonia and urinary tract infection. Patient was placed empirically on IV antibiotics. Patient was hydrated with IV fluids. CT head which was done was negative. Patient did not have any focal neurological deficits. Patient improved clinically throughout the hospitalization were discharged in stable and improved condition on 3 more days of oral antibiotics to complete a one-week course of antibiotics therapy.   #4 C. difficile colitis Patient was on oral vancomycin from facility as he was being treated for C. difficile colitis. Patient was maintained on oral vancomycin through his hospitalization and will continue it on discharge as previously scheduled to be finished on 04/16/2015.   #5 weakness PT/OT.  #6 transaminitis/elevated LFTs/autoimmune hepatitis Per discharge summary elevated F/acting IgG, and a workup suspicious for immune hepatitis. Ultrasound-guided liver biopsy on 04/06/2015 consistent with autoimmune hepatitis. Will hold off on IV steroids at this time. CT showed cholelithiasis but no, bile duct dilatation. Patient denied any abdominal pain. LFTs slowly trending down. Avoid liver toxic medications. Will need outpatient follow-up.  #7 erosive gastritis and duodenitis Per EGD on 04/07/2015. Continued on Pepcid.  #8 history of DVT Continued on Eliquis.  #9 history of CVA Aspirin was held and patient was maintained on eliquis.  #10 chronic kidney disease Stable.  #11 hypertension Patient was maintained on his home regimen of Norvasc and patient's metoprolol dose was increased to 50 mg daily with better blood pressure control. Outpatient follow-up.   #12 diabetes mellitus Hemoglobin A1c is 7.5. CBGs were elevated during the hospitalization and a such  patient's Lantus was adjusted and increased to 20 units daily. Patient was subsequently also placed a meal coverage insulin as well as sliding scale insulin. Patient was discharged to skilled nursing facility on increased dose of Lantus at 20 units daily which may be uptitrated as deemed necessary per M.D. at facility.   #13 hypothyroidism TSH at 1.565. Continued on home regimen Synthroid.  #14 hyperlipidemia Discontinued statin secondary to transaminitis. Outpatient follow-up.   Procedures:  CT Head 04/11/15  CXR 04/11/15    Consultations:  None  Discharge Exam: Filed Vitals:   04/15/15 0630  BP: 148/86  Pulse: 76  Temp: 98.4 F (36.9 C)  Resp: 18    General: NAD Cardiovascular: RRR Respiratory: CTAB anterior lung fields  Discharge Instructions   Discharge Instructions    Diet Carb Modified    Complete by:  As directed      Discharge instructions    Complete by:  As directed   Follow up with MD at SNF.     Increase activity slowly    Complete by:  As directed           Current Discharge Medication List    START taking these medications   Details  amoxicillin-clavulanate (AUGMENTIN) 875-125 MG per tablet Take 1 tablet by mouth every 12 (twelve) hours. Take for 3 days then stop. Qty: 6 tablet, Refills: 0    feeding supplement, GLUCERNA SHAKE, (GLUCERNA SHAKE) LIQD Take 237 mLs by mouth 3 (three) times daily between meals. Refills: 0    guaiFENesin-dextromethorphan (ROBITUSSIN DM) 100-10 MG/5ML syrup Take 5 mLs by mouth every 4 (four) hours as needed for cough. Qty: 118 mL, Refills: 0  CONTINUE these medications which have CHANGED   Details  insulin glargine (LANTUS) 100 UNIT/ML injection Inject 0.2 mLs (20 Units total) into the skin at bedtime. Qty: 10 mL, Refills: 0    metoprolol succinate (TOPROL-XL) 50 MG 24 hr tablet Take 1 tablet (50 mg total) by mouth daily. Take with or immediately following a meal. Qty: 30 tablet, Refills: 0       CONTINUE these medications which have NOT CHANGED   Details  amLODipine (NORVASC) 10 MG tablet Take 1 tablet (10 mg total) by mouth daily.    apixaban (ELIQUIS) 5 MG TABS tablet Take 1 tablet (5 mg total) by mouth 2 (two) times daily. Qty: 60 tablet    brimonidine (ALPHAGAN) 0.2 % ophthalmic solution Place 1 drop into the left eye 2 (two) times daily.    budesonide-formoterol (SYMBICORT) 160-4.5 MCG/ACT inhaler Inhale 2 puffs into the lungs 2 (two) times daily.    cetirizine (ZYRTEC) 10 MG tablet Take 10 mg by mouth daily.    cholecalciferol (VITAMIN D) 1000 UNITS tablet Take 2,000 Units by mouth daily.    hydroxypropyl methylcellulose / hypromellose (ISOPTO TEARS / GONIOVISC) 2.5 % ophthalmic solution Place 1 drop into both eyes 4 (four) times daily as needed for dry eyes (dry eyes).    insulin aspart (NOVOLOG) 100 UNIT/ML injection Sliding Scale 70-120 (0 units) 121-150 (1 unit) 151-200(2 units) 201-250 (3 units) 251-300( 5 units)  301-350 (7 units) 351-400 (9 units) >400 call MD Qty: 10 mL, Refills: 11    levothyroxine (SYNTHROID, LEVOTHROID) 100 MCG tablet Take 200 mcg by mouth daily before breakfast.    polyvinyl alcohol (LIQUIFILM TEARS) 1.4 % ophthalmic solution Place 1 drop into both eyes daily as needed for dry eyes. Qty: 15 mL, Refills: 0    ranitidine (ZANTAC) 300 MG tablet Take 1 tablet (300 mg total) by mouth 2 (two) times daily.    saccharomyces boulardii (FLORASTOR) 250 MG capsule Take 1 capsule (250 mg total) by mouth 2 (two) times daily.    sertraline (ZOLOFT) 100 MG tablet Take 200 mg by mouth daily.    tamsulosin (FLOMAX) 0.4 MG CAPS capsule Take 1 capsule (0.4 mg total) by mouth daily. Qty: 30 capsule    vancomycin (VANCOCIN) 50 mg/mL oral solution Take 2.5 mLs (125 mg total) by mouth every 6 (six) hours.      STOP taking these medications     atorvastatin (LIPITOR) 80 MG tablet        No Known Allergies Follow-up Information    Please follow  up.   Why:  f/u with MD at SNF       The results of significant diagnostics from this hospitalization (including imaging, microbiology, ancillary and laboratory) are listed below for reference.    Significant Diagnostic Studies: Dg Chest 1 View  04/11/2015   CLINICAL DATA:  Lethargy and weakness  EXAM: CHEST  1 VIEW  COMPARISON:  August 22, 2010  FINDINGS: There is airspace consolidation in the left lower lobe. Lungs elsewhere clear. Heart is upper normal in size with pulmonary vascularity within normal limits. There is atherosclerotic change in aorta. 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 No pneumothorax. No adenopathy.  IMPRESSION: Left lower lobe airspace consolidation. Followup PA and lateral chest radiographs recommended in 3-4 weeks following trial of antibiotic therapy to ensure resolution and exclude underlying malignancy.   Electronically Signed   By: Bretta Bang III M.D.   On: 04/11/2015 17:05   Ct Head  Wo Contrast  04/11/2015   CLINICAL DATA:  Weakness, fatigue  EXAM: CT HEAD WITHOUT CONTRAST  TECHNIQUE: Contiguous axial images were obtained from the base of the skull through the vertex without intravenous contrast.  COMPARISON:  12/23/2009  FINDINGS: No skull fracture is noted. There is mucosal thickening with partial opacification right ethmoid air cells. The mastoid air cells are unremarkable. No intracranial hemorrhage, mass effect or midline shift. Moderate cerebral atrophy. Mild periventricular and patchy subcortical white matter decreased attenuation probable due to chronic small vessel ischemic changes. Atherosclerotic calcifications of carotid siphon. No acute cortical infarction. No mass lesion is noted on this unenhanced scan.  IMPRESSION: No acute intracranial abnormality. Again noted cerebral atrophy. Periventricular and patchy subcortical white matter decreased attenuation probable due to chronic small vessel ischemic changes. There is  mucosal thickening with partial opacification right ethmoid air cells.   Electronically Signed   By: Natasha Mead M.D.   On: 04/11/2015 16:56   Nm Hepatobiliary Liver Func  04/04/2015   CLINICAL DATA:  69 year old male with history of gallstones. Total bilirubin 1.1.  EXAM: NUCLEAR MEDICINE HEPATOBILIARY IMAGING  TECHNIQUE: Sequential images of the abdomen were obtained out to 60 minutes following intravenous administration of radiopharmaceutical.  RADIOPHARMACEUTICALS:  5.5 mCi Tc-40m Choletec IV  COMPARISON:  CT of the abdomen and pelvis 04/03/2015.  FINDINGS: There is rapid uptake of radiotracer within the hepatic parenchyma. Some gallbladder activity is noted by 10 minutes. Small bowel activity is noted by 15 minutes. Continued accumulation of radiotracer in the hepatic parenchyma and gallbladder, with continued excretion into the small bowel is observed throughout the examination.  IMPRESSION: 1. Normal examination. No findings to suggest cholecystitis or biliary tract obstruction.   Electronically Signed   By: Trudie Reed M.D.   On: 04/04/2015 09:06   Ct Abdomen Pelvis W Contrast  04/03/2015   CLINICAL DATA:  Diarrhea. Recent diagnosis of C diff colitis. Weakness.  EXAM: CT ABDOMEN AND PELVIS WITH CONTRAST  TECHNIQUE: Multidetector CT imaging of the abdomen and pelvis was performed using the standard protocol following bolus administration of intravenous contrast.  CONTRAST:  OMNIPAQUE IOHEXOL 300 MG/ML  SOLN  COMPARISON:  None available.  FINDINGS: Hypoventilatory change at the lung bases. The heart is enlarged. Pacemaker partially included.  Decreased hepatic density with lobular contours and diminished size the left hepatic lobe, suggestive of cirrhosis. No focal hepatic lesion. There is a periportal lymph node measuring 1 cm in short axis dimension. Calcified gallstones within physiologically distended gallbladder. No biliary dilatation. Small hypodensity in the anterior spleen, likely  hemangioma. Spleen is normal in size. The adrenal glands are normal. Mild pancreatic atrophy. There are multiple hypodensities scattered throughout the pancreatic parenchyma. The pancreatic head this measures 1.5 cm, in the distal body 0.9 cm, and in the tail 0.9 cm. Kidneys demonstrate symmetric enhancement and excretion. Small subcentimeter bilateral renal cysts. Within the upper left kidney is a 1.4 cm lesion measuring soft tissue density, Hounsfield units of 38.  Small amount contrast of the distal esophagus. Stomach is decompressed. There are no dilated or thickened bowel loops. Midline ventral hernia contains normal appearing loops of small bowel. There is minimal colonic wall thickening involving the sigmoid colon without significant surrounding pericolonic inflammatory change. The remainder the colon is decompressed. Distal colonic diverticulosis without diverticulitis. The appendix is normal. No free air, free fluid, or intra-abdominal fluid collection.  No retroperitoneal adenopathy. Abdominal aorta is normal in caliber. Moderate atherosclerosis without aneurysm.  Within the pelvis the  urinary bladder is distended, air in the bladder is likely related to instrumentation. Prostate gland appears prominent. There is no pelvic free fluid. No pelvic adenopathy. Probable post left inguinal hernia repair.  There are no acute or suspicious osseous abnormalities. Cortical irregularity about the right anterior iliac crest suggestive of remote prior injury. There scattered bone islands.  IMPRESSION: 1. Minimal colonic wall thickening involving the sigmoid colon, may reflect mild colitis. 2. Ventral abdominal wall hernia containing normal appearing loops of small bowel without bowel compromise or obstruction. 3. Nodular hepatic contours, suggestive of underlying cirrhosis. No focal hepatic lesion. 4. Small round hypodensities within the pancreas, largest measuring 1.5 cm. This may reflect IP MN, however correlation  with any prior imaging would be most helpful. In the absence of prior imaging, nonemergent MRCP could be considered if patient is able tolerate breath hold technique. 5. Cholelithiasis without cholecystitis. 6. Bilateral renal cysts. In addition there is a left upper pole 1.5 cm lesion measuring higher than simple fluid density. This may reflect a complex or hemorrhagic cyst. Correlation with any prior imaging would be most helpful. In the absence of prior imaging, correlation with contrast-enhanced MRI is recommended.   Electronically Signed   By: Rubye Oaks M.D.   On: 04/03/2015 01:38   US Biopsy  04/06/2015   CLINICAL DATA:  Elevated LFTs, abnormal contour on CT suggesting cirrhosis.  EXAM: ULTRASOUND-GUIDED CORE LIVER BIOPSY  TECHNIQUE: An ultrasound guided liver biopsy was thoroughly discussed with the patient and questions were answered. The benefits, risks, alternatives, and complications were also discussed. The patient understands and wishes to proceed with the procedure. A verbal as well as written consent was obtained.  Survey ultrasound of the liver was performed and an appropriate skin entry site was determined. Skin site was marked, prepped with Betadine, and draped in usual sterile fashion, and infiltrated locally with 1% lidocaine.  Intravenous Fentanyl and Versed were administered as conscious sedation during continuous cardiorespiratory monitoring by the radiology RN, with a total moderate sedation time of less than 30 minutes.  A 17 gauge trocar needle was advanced under ultrasound guidance into the liver. 3 coaxial 18gauge core samples were then obtained through the guide needle. The guide needle was removed. Post procedure scans demonstrate no apparent complication.  COMPLICATIONS: COMPLICATIONS None immediate  FINDINGS: Survey images of the liver demonstrate no focal lesion or intrahepatic biliary ductal dilatation. Cholelithiasis without gallbladder wall thickening incidentally noted.  Core biopsy samples obtained without complication.  IMPRESSION: 1. Technically successful ultrasound guided core liver biopsy.   Electronically Signed   By: Corlis Leak M.D.   On: 04/06/2015 15:33    Microbiology: Recent Results (from the past 240 hour(s))  Culture, blood (x 2)     Status: None (Preliminary result)   Collection Time: 04/11/15 10:15 PM  Result Value Ref Range Status   Specimen Description BLOOD RIGHT ARM  Final   Special Requests BOTTLES DRAWN AEROBIC AND ANAEROBIC 5CC  Final   Culture   Final           BLOOD CULTURE RECEIVED NO GROWTH TO DATE CULTURE WILL BE HELD FOR 5 DAYS BEFORE ISSUING A FINAL NEGATIVE REPORT Performed at [facility]    Report Status PENDING  Incomplete  Culture, blood (x 2)     Status: None (Preliminary result)   Collection Time: 04/11/15 10:20 PM  Result Value Ref Range Status   Specimen Description BLOOD LEFT ARM  Final   Special Requests BOTTLES DRAWN AEROBIC  AND ANAEROBIC 5CC  Final   Culture   Final           BLOOD CULTURE RECEIVED NO GROWTH TO DATE CULTURE WILL BE HELD FOR 5 DAYS BEFORE ISSUING A FINAL NEGATIVE REPORT Performed at [facility]    Report Status PENDING  Incomplete  MRSA PCR Screening     Status: None   Collection Time: 04/11/15 10:40 PM  Result Value Ref Range Status   MRSA by PCR NEGATIVE NEGATIVE Final    Comment:        The GeneXpert MRSA Assay (FDA approved for NASAL specimens only), is one component of a comprehensive MRSA colonization surveillance program. It is not intended to diagnose MRSA infection nor to guide or monitor treatment for MRSA infections.   Urine culture     Status: None   Collection Time: 04/11/15 11:29 PM  Result Value Ref Range Status   Specimen Description URINE, CLEAN CATCH  Final   Special Requests NONE  Final   Culture   Final    NO GROWTH 1 DAY Performed at [facility]    Report Status 04/13/2015 FINAL  Final     Labs: Basic Metabolic Panel:  Recent  Labs Lab 04/11/15 1742 04/12/15 0719 04/13/15 0810 04/14/15 0825 04/15/15 0544  NA 133* 133* 132* 128* 133*  K 3.1* 3.5 3.2* 3.5 3.5  CL 98* 100* 100* 96* 100*  CO2 GLUCOSE 213* 205* 261* 289* 226*  BUN 24* 22*  CREATININE 1.29* 1.23 1.10 1.13 1.31*  CALCIUM 8.9 8.8* 8.5* 8.4* 8.7*  MG  --  1.8 1.7 1.9  --    Liver Function Tests:  Recent Labs Lab 04/11/15 1742 04/12/15 0719 04/13/15 0810 04/15/15 0544  AST 537* 564* 496* 522*  ALT 456* 454* 386* 390*  ALKPHOS 641* 569* 613* 735*  BILITOT 1.8* 2.3* 2.0* 1.7*  PROT 7.4 7.4 7.0 6.9  ALBUMIN 2.4* 2.5* 2.3* 2.2*   No results for input(s): LIPASE, AMYLASE in the last 168 hours. No results for input(s): AMMONIA in the last 168 hours. CBC:  Recent Labs Lab 04/11/15 1742 04/12/15 0719 04/13/15 0810 04/14/15 0825 04/15/15 0544  WBC 7.1 7.5 6.4 5.8 7.8  NEUTROABS 3.6  --   --   --   --   HGB 12.7* 12.5* 12.2* 12.8* 11.8*  HCT 37.2* 37.6* 36.7* 37.2* 35.0*  MCV 84.4 83.9 84.0 83.4 83.1  PLT 146* 153 131* 123* 148*   Cardiac Enzymes: No results for input(s): CKTOTAL, CKMB, CKMBINDEX, TROPONINI in the last 168 hours. BNP: BNP (last 3 results) No results for input(s): BNP in the last 8760 hours.  ProBNP (last 3 results) No results for input(s): PROBNP in the last 8760 hours.  CBG:  Recent Labs Lab 04/14/15 0739 04/14/15 1216 04/14/15 1711 04/14/15 2050 04/15/15 0757  GLUCAP 258* 253* 254* 243* 189*       Signed:  Jedrick Hutcherson MD [facility] 04/15/2015, 11:15 AM

## 2015-04-15 NOTE — Progress Notes (Signed)
Pt discharged.  Leaving to [facility].  Wife aware per Rene Kocher, Child psychotherapist.  Report given to [place] at [facility] at 1400.  Pt going to [facility] room 602.  Room air.  No s/s of pain or distress.

## 2015-04-15 NOTE — Clinical Social Work Note (Signed)
CSW reviewed pt chart which reflected a discharge summary  CSW called and spoke with pt's wife to assess if pt was going back to [facility]  Pt's wife stated that pt should go back to [facility] by ambulance  CSW spoke with [place], uploaded discharge summary, prepared discharge packet, provided it to RN and called for pt transport  No further CSW needs  .Elray Buba, LCSW [facility] Clinical Social Worker - Weekend Coverage cell #: [phone]

## 2015-04-17 ENCOUNTER — Non-Acute Institutional Stay (SKILLED_NURSING_FACILITY): Payer: Medicare Other | Admitting: Adult Health

## 2015-04-17 DIAGNOSIS — R531 Weakness: Secondary | ICD-10-CM | POA: Diagnosis not present

## 2015-04-17 DIAGNOSIS — A047 Enterocolitis due to Clostridium difficile: Secondary | ICD-10-CM

## 2015-04-17 DIAGNOSIS — E039 Hypothyroidism, unspecified: Secondary | ICD-10-CM | POA: Diagnosis not present

## 2015-04-17 DIAGNOSIS — E1165 Type 2 diabetes mellitus with hyperglycemia: Secondary | ICD-10-CM | POA: Diagnosis not present

## 2015-04-17 DIAGNOSIS — K2900 Acute gastritis without bleeding: Secondary | ICD-10-CM

## 2015-04-17 DIAGNOSIS — J189 Pneumonia, unspecified organism: Secondary | ICD-10-CM

## 2015-04-17 DIAGNOSIS — F329 Major depressive disorder, single episode, unspecified: Secondary | ICD-10-CM

## 2015-04-17 DIAGNOSIS — K754 Autoimmune hepatitis: Secondary | ICD-10-CM

## 2015-04-17 DIAGNOSIS — E43 Unspecified severe protein-calorie malnutrition: Secondary | ICD-10-CM

## 2015-04-17 DIAGNOSIS — N183 Chronic kidney disease, stage 3 unspecified: Secondary | ICD-10-CM

## 2015-04-17 DIAGNOSIS — IMO0002 Reserved for concepts with insufficient information to code with codable children: Secondary | ICD-10-CM

## 2015-04-17 DIAGNOSIS — F32A Depression, unspecified: Secondary | ICD-10-CM

## 2015-04-17 DIAGNOSIS — N400 Enlarged prostate without lower urinary tract symptoms: Secondary | ICD-10-CM

## 2015-04-17 DIAGNOSIS — J309 Allergic rhinitis, unspecified: Secondary | ICD-10-CM | POA: Diagnosis not present

## 2015-04-17 DIAGNOSIS — K2960 Other gastritis without bleeding: Secondary | ICD-10-CM

## 2015-04-17 DIAGNOSIS — Z86718 Personal history of other venous thrombosis and embolism: Secondary | ICD-10-CM | POA: Diagnosis not present

## 2015-04-17 DIAGNOSIS — I10 Essential (primary) hypertension: Secondary | ICD-10-CM | POA: Diagnosis not present

## 2015-04-17 DIAGNOSIS — J449 Chronic obstructive pulmonary disease, unspecified: Secondary | ICD-10-CM

## 2015-04-17 DIAGNOSIS — A0472 Enterocolitis due to Clostridium difficile, not specified as recurrent: Secondary | ICD-10-CM

## 2015-04-17 NOTE — Progress Notes (Signed)
Patient ID: [name], male   DOB: 1946-04-10, 69 y.o.   MRN: [id number]   04/10/15  Facility:  [facility]urs[facility] Home Location:  [place] Room Number: 440-H LEVEL OF CARE:  SNF (31)   Chief Complaint  Patient presents with  . Hospitalization Follow-up    Generalized weakness, C. difficile diarrhea, elevated LFTs, erosive gastritis, pancreatic lesion history of DVT, history of CVA, CKD stage III, hypertension, diabetes mellitus, depression, BPH, hypothyroidism, allergic rhinitis, COPD and hyperlipidemia    HISTORY OF PRESENT ILLNESS:  This is a 69 year old male who was been admitted to [facility] on 04/08/15 from [facility]. He has PMH of diabetes mellitus type 2, previous stroke, CKD, permanent pacemaker, history of DVT, hypothyroidism and COPD. He was brought to the hospital due to persistent diarrhea and generalized weakness. He was diagnosed with C. difficile 2 months ago and was treated with antibiotics. He continued to have 4-5 episodes of diarrhea in a day. Patient had become bed bound over the last 2 months due to weakness. CT of abdomen and pelvis in the ER showed possible sigmoid colitis with cold bladder stones and intraductal papillary tumor. 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 Ultrasound-guided biopsy by IR on 6/9, EGD on 6/10 significant for erosive gastritis and duodenitis.  He has been admitted for a short-term rehabilitation.  PAST MEDICAL HISTORY:  Past Medical History  Diagnosis Date  . Diabetes mellitus without complication   . Hypertension   . Presence of permanent cardiac pacemaker   . Hypothyroidism   . Stroke   . DVT (deep vein thrombosis) in pregnancy   . C. difficile colitis     CURRENT MEDICATIONS: Reviewed per MAR/see medication list  No Known Allergies   REVIEW OF SYSTEMS:  GENERAL: no change in appetite, no fatigue, no  weight changes, no fever, chills or weakness RESPIRATORY: no cough, SOB, DOE, wheezing, hemoptysis CARDIAC: no chest pain, edema or palpitations GI: no abdominal pain, diarrhea, constipation, heart burn, nausea or vomiting  PHYSICAL EXAMINATION  GENERAL: no acute distress, obese EYES: conjunctivae normal, sclerae normal, normal eye lids NECK: supple, trachea midline, no neck masses, no thyroid tenderness, no thyromegaly LYMPHATICS: no LAN in the neck, no supraclavicular LAN RESPIRATORY: breathing is even & unlabored, BS CTAB CARDIAC: RRR, no murmur,no extra heart sounds, no edema, left chest pacemaker GI: abdomen soft, normal BS, no masses, no tenderness, no hepatomegaly, no splenomegaly EXTREMITIES: Generalized weakness on bilateral lower extremities, able to move 4 extremities PSYCHIATRIC: the patient is alert & oriented to person, affect & behavior appropriate  LABS/RADIOLOGY: Labs reviewed: BMP 04/07/15 Sodium 135 - 145 mmol/L 135   Potassium 3.5 - 5.1 mmol/L 3.5   Chloride 101 - 111 mmol/L 102   CO2 22 - 32 mmol/L 26   Glucose, Bld 65 - 99 mg/dL 138 (H)   BUN 6 - 20 mg/dL 25 (H)   Creatinine, Ser 0.61 - 1.24 mg/dL 1.24   Calcium 8.9 - 10.3 mg/dL 8.7 (L)   GFR calc non Af Amer >60 mL/min 57 (L)   GFR calc Af Amer >60 mL/min >60   Comments: (NOTE)  The eGFR has been calculated using the CKD EPI equation.  This calculation has not been validated in all clinical situations.  eGFR's persistently <60 mL/min signify possible Chronic Kidney  Disease.     Anion gap 5 - 15  7   Resulting Agency  [facility]           Recent Labs  04/02/15 2119  LIPASE 15*   CBC:  Recent Labs   04/02/15 2119 04/03/15 0650        WBC 5.7 6.6  < >       NEUTROABS 2.6 2.8  --        HGB 13.7 13.1  < >       HCT 41.1 40.1  < >       MCV 86.0 85.5  < >       PLT 166 167  < >       < > = values in this interval not displayed.  Cardiac Enzymes:  Recent Labs  04/03/15 0650  04/04/15 1123 04/04/15 1655 04/04/15 2227  CKTOTAL 45*  --   --   --   TROPONINI 0.41* 0.40* 0.38* 0.38*      Dg Chest 1 View  04/11/2015   CLINICAL DATA:  Lethargy and weakness  EXAM: CHEST  1 VIEW  COMPARISON:  August 22, 2010  FINDINGS: There is airspace consolidation in the left lower lobe. Lungs elsewhere clear. Heart is upper normal in size with pulmonary vascularity within normal limits. There is atherosclerotic change in aorta. 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 No pneumothorax. No adenopathy.  IMPRESSION: Left lower lobe airspace consolidation. Followup PA and lateral chest radiographs recommended in 3-4 weeks following trial of antibiotic therapy to ensure resolution and exclude underlying malignancy.   Electronically Signed   By: Lowella Grip III M.D.   On: 04/11/2015 17:05   Ct Head Wo Contrast  04/11/2015   CLINICAL DATA:  Weakness, fatigue  EXAM: CT HEAD WITHOUT CONTRAST  TECHNIQUE: Contiguous axial images were obtained from the base of the skull through the vertex without intravenous contrast.  COMPARISON:  12/23/2009  FINDINGS: No skull fracture is noted. There is mucosal thickening with partial opacification right ethmoid air cells. The mastoid air cells are unremarkable. No intracranial hemorrhage, mass effect or midline shift. Moderate cerebral atrophy. Mild periventricular and patchy subcortical white matter decreased attenuation probable due to chronic small vessel ischemic changes. Atherosclerotic calcifications of carotid siphon. No acute cortical infarction. No mass lesion is noted on this unenhanced scan.  IMPRESSION: No acute intracranial abnormality. Again noted cerebral atrophy. Periventricular and patchy subcortical white matter decreased attenuation probable due to chronic small vessel ischemic changes. There is mucosal thickening with partial opacification right ethmoid air cells.   Electronically Signed   By: Lahoma Crocker M.D.   On:  04/11/2015 16:56   Nm Hepatobiliary Liver Func  04/04/2015   CLINICAL DATA:  69 year old male with history of gallstones. Total bilirubin 1.1.  EXAM: NUCLEAR MEDICINE HEPATOBILIARY IMAGING  TECHNIQUE: Sequential images of the abdomen were obtained out to 60 minutes following intravenous administration of radiopharmaceutical.  RADIOPHARMACEUTICALS:  5.5 mCi Tc-76m Choletec IV  COMPARISON:  CT of the abdomen and pelvis 04/03/2015.  FINDINGS: There is rapid uptake of radiotracer within the hepatic parenchyma. Some gallbladder activity is noted by 10 minutes. Small bowel activity is noted by 15 minutes. Continued accumulation of radiotracer in the hepatic parenchyma and gallbladder, with continued excretion into the small bowel is observed throughout the examination.  IMPRESSION: 1. Normal examination. No findings to suggest cholecystitis or biliary tract obstruction.   Electronically Signed   By: Vinnie Langton M.D.   On: 04/04/2015 09:06   Ct Abdomen Pelvis W Contrast  04/03/2015  CLINICAL DATA:  Diarrhea. Recent diagnosis of C diff colitis. Weakness.  EXAM: CT ABDOMEN AND PELVIS WITH CONTRAST  TECHNIQUE: Multidetector CT imaging of the abdomen and pelvis was performed using the standard protocol following bolus administration of intravenous contrast.  CONTRAST:  143mL OMNIPAQUE IOHEXOL 300 MG/ML  SOLN  COMPARISON:  None available.  FINDINGS: Hypoventilatory change at the lung bases. The heart is enlarged. Pacemaker partially included.  Decreased hepatic density with lobular contours and diminished size the left hepatic lobe, suggestive of cirrhosis. No focal hepatic lesion. There is a periportal lymph node measuring 1 cm in short axis dimension. Calcified gallstones within physiologically distended gallbladder. No biliary dilatation. Small hypodensity in the anterior spleen, likely hemangioma. Spleen is normal in size. The adrenal glands are normal. Mild pancreatic atrophy. There are multiple hypodensities  scattered throughout the pancreatic parenchyma. The pancreatic head this measures 1.5 cm, in the distal body 0.9 cm, and in the tail 0.9 cm. Kidneys demonstrate symmetric enhancement and excretion. Small subcentimeter bilateral renal cysts. Within the upper left kidney is a 1.4 cm lesion measuring soft tissue density, Hounsfield units of 38.  Small amount contrast of the distal esophagus. Stomach is decompressed. There are no dilated or thickened bowel loops. Midline ventral hernia contains normal appearing loops of small bowel. There is minimal colonic wall thickening involving the sigmoid colon without significant surrounding pericolonic inflammatory change. The remainder the colon is decompressed. Distal colonic diverticulosis without diverticulitis. The appendix is normal. No free air, free fluid, or intra-abdominal fluid collection.  No retroperitoneal adenopathy. Abdominal aorta is normal in caliber. Moderate atherosclerosis without aneurysm.  Within the pelvis the urinary bladder is distended, air in the bladder is likely related to instrumentation. Prostate gland appears prominent. There is no pelvic free fluid. No pelvic adenopathy. Probable post left inguinal hernia repair.  There are no acute or suspicious osseous abnormalities. Cortical irregularity about the right anterior iliac crest suggestive of remote prior injury. There scattered bone islands.  IMPRESSION: 1. Minimal colonic wall thickening involving the sigmoid colon, may reflect mild colitis. 2. Ventral abdominal wall hernia containing normal appearing loops of small bowel without bowel compromise or obstruction. 3. Nodular hepatic contours, suggestive of underlying cirrhosis. No focal hepatic lesion. 4. Small round hypodensities within the pancreas, largest measuring 1.5 cm. 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 In the absence of prior imaging, nonemergent MRCP could be considered if patient is  able tolerate breath hold technique. 5. Cholelithiasis without cholecystitis. 6. Bilateral renal cysts. In addition there is a left upper pole 1.5 cm lesion measuring higher than simple fluid density. This may reflect a complex or hemorrhagic cyst. Correlation with any prior imaging would be most helpful. In the absence of prior imaging, correlation with contrast-enhanced MRI is recommended.   Electronically Signed   By: Jeb Levering M.D.   On: 04/03/2015 01:38   US Biopsy  04/06/2015   CLINICAL DATA:  Elevated LFTs, abnormal contour on CT suggesting cirrhosis.  EXAM: ULTRASOUND-GUIDED CORE LIVER BIOPSY  TECHNIQUE: An ultrasound guided liver biopsy was thoroughly discussed with the patient and questions were answered. The benefits, risks, alternatives, and complications were also discussed. The patient understands and wishes to proceed with the procedure. A verbal as well as written consent was obtained.  Survey ultrasound of the liver was performed and an appropriate skin entry site was determined. Skin site was marked, prepped with Betadine, and draped in usual sterile fashion, and infiltrated  locally with 1% lidocaine.  Intravenous Fentanyl and Versed were administered as conscious sedation during continuous cardiorespiratory monitoring by the radiology RN, with a total moderate sedation time of less than 30 minutes.  A 17 gauge trocar needle was advanced under ultrasound guidance into the liver. 3 coaxial 18gauge core samples were then obtained through the guide needle. The guide needle was removed. Post procedure scans demonstrate no apparent complication.  COMPLICATIONS: COMPLICATIONS None immediate  FINDINGS: Survey images of the liver demonstrate no focal lesion or intrahepatic biliary ductal dilatation. Cholelithiasis without gallbladder wall thickening incidentally noted. Core biopsy samples obtained without complication.  IMPRESSION: 1. Technically successful ultrasound guided core liver biopsy.    Electronically Signed   By: Lucrezia Europe M.D.   On: 04/06/2015 15:33    ASSESSMENT/PLAN:  Generalized weakness - for rehabilitation C. difficile diarrhea - continue vancomycin 50 mg per mL take 2.5 mL by mouth every 6 hours 14 days and Florastor 250 mg 1 capsule by mouth twice a day Elevated LFTs/autoimmune hepatitis - GI consult patient recommended to hold any steroidal treatment until C. difficile is resolved; biopsy done 6/09 Erosive gastritis and duodenitis - continue Zantac 300 mg by mouth twice a day Pancreatic lesion - needs endoscopic ultrasound as outpatient, follow-up with Dr. Lucio Edward, gastroenterology  History of DVT - patient was previously on Coumadin and INR was reversed initially with FFP and vitamin K for procedure; transition to a loquacious 5 mg 1 tab by mouth twice a day History of CVA - aspirin on hold due to erosive gastritis and duodenitis CKD stage III - baseline creatinine 1.3-1.5; creatinine 1.24 Hypertension - recently started on continue Norvasc 10 mg 1 tab by mouth daily and continue Toprol-XL 50 mg by mouth daily Diabetes mellitus, type II with CKD - continue Lantus 10 units subcutaneous daily at bedtime and NovoLog sliding scale 3 times a day with meals Depression - continue Zoloft 200 mg by mouth daily BPH - continue Flomax 0.4 mg 1 capsule by mouth daily Hypothyroidism - continue levothyroxine 100 g 2 tabs = 200 g by mouth daily Allergic rhinitis - continue Zyrtec 10 mg 1 tab by mouth daily COPD - continue Symbicort 160-4.5 g/ACT inhaled 2 puffs into the lungs twice a day Hyperlipidemia - continue Lipitor 80 mg by mouth daily at bedtime   Goals of care:  Short-term rehabilitation   Labs/test ordered: CBC, CMP and chest x-ray 2 views  Spent 50 minutes in patient care.   [facility], NP [employer] [phone]

## 2015-04-17 NOTE — Progress Notes (Signed)
Patient ID: [name], male   DOB: Mar 20, 1946, 69 y.o.   MRN: [id number]   04/17/2015  Facility:  Nursing Home Location:  [facility] Room Number: [id number] LEVEL OF CARE:  SNF (31)   Chief Complaint  Patient presents with  . Hospitalization Follow-up    Generalized weakness, C. difficile, autoimmune hepatitis, erosive gastritis, history of DVT, CKD stage III, hypertension, diabetes mellitus, hypothyroidism, COPD, allergic rhinitis, depression, BPH and protein calorie malnutrition    HISTORY OF PRESENT ILLNESS:  This is a 69 year old male who was been readmitted to [facility] on 04/15/15 from [facility]. He has PMH of diabetes mellitus type 2, history of stroke, CKD stage III, permanent pacemaker, history of DVT and currently on Eliquis, hypothyroidism and COPD. He was transferred to the hospital from [facility] due to lethargy and confusion. There was concern of possible stroke. He was diagnosed and treated for HCAP. CT was negative for abnormalities. Chest x-ray showed left lower lobe infiltrates.  He has been admitted for a short-term rehabilitation.  PAST MEDICAL HISTORY:  Past Medical History  Diagnosis Date  . Diabetes mellitus without complication   . Hypertension   . Presence of permanent cardiac pacemaker   . Hypothyroidism   . Stroke   . DVT (deep vein thrombosis) in pregnancy   . C. difficile colitis     CURRENT MEDICATIONS: Reviewed per MAR/see medication list  No Known Allergies   REVIEW OF SYSTEMS:  GENERAL: no change in appetite, no fatigue, no weight changes, no fever, chills or weakness RESPIRATORY: no cough, SOB, DOE, wheezing, hemoptysis CARDIAC: no chest pain, edema or palpitations GI: no abdominal pain, constipation, heart burn, nausea or vomiting, + diarrhea  PHYSICAL EXAMINATION  GENERAL: no acute distress, normal body habitus EYES: conjunctivae normal, sclerae normal, normal eye lids NECK: supple, trachea  midline, no neck masses, no thyroid tenderness, no thyromegaly LYMPHATICS: no LAN in the neck, no supraclavicular LAN RESPIRATORY: breathing is even & unlabored, BS CTAB CARDIAC: RRR, no murmur,no extra heart sounds, no edema, left chest pacemaker GI: abdomen soft, normal BS, no masses, no tenderness, no hepatomegaly, no splenomegaly EXTREMITIES:  Able to move 4 extremities but has generalized weakness of bilateral lower extremities PSYCHIATRIC: the patient is alert & oriented to person, affect & behavior appropriate  LABS/RADIOLOGY: Labs reviewed: Basic Metabolic Panel:  Recent Labs  40/98/11 0719 04/13/15 0810 04/14/15 0825 04/15/15 0544  NA 133* 132* 128* 133*  K 3.5 3.2* 3.5 3.5  CL 100* 100* 96* 100*  CO2 26 25 28 25   GLUCOSE 205* 261* 289* 226*  BUN 20 17 16  22*  CREATININE 1.23 1.10 1.13 1.31*  CALCIUM 8.8* 8.5* 8.4* 8.7*  MG 1.8 1.7 1.9  --    Liver Function Tests:  Recent Labs  04/12/15 0719 04/13/15 0810 04/15/15 0544  AST 564* 496* 522*  ALT 454* 386* 390*  ALKPHOS 569* 613* 735*  BILITOT 2.3* 2.0* 1.7*  PROT 7.4 7.0 6.9  ALBUMIN 2.5* 2.3* 2.2*    Recent Labs  04/02/15 2119  LIPASE 15*   CBC:  Recent Labs  04/02/15 2119 04/03/15 0650  04/11/15 1742  04/13/15 0810 04/14/15 0825 04/15/15 0544  WBC 5.7 6.6  < > 7.1  < > 6.4 5.8 7.8  NEUTROABS 2.6 2.8  --  3.6  --   --   --   --   HGB 13.7 13.1  < > 12.7*  < > 12.2* 12.8* 11.8*  HCT 41.1 40.1  < > 37.2*  < > 36.7* 37.2* 35.0*  MCV 86.0 85.5  < > 84.4  < > 84.0 83.4 83.1  PLT 166 167  < > 146*  < > 131* 123* 148*  < > = values in this interval not displayed.  Lipid Panel:  Recent Labs  04/12/15 0719  HDL 13*   Cardiac Enzymes:  Recent Labs  04/03/15 0650 04/04/15 1123 04/04/15 1655 04/04/15 2227  CKTOTAL 45*  --   --   --   TROPONINI 0.41* 0.40* 0.38* 0.38*   CBG:  Recent Labs  04/14/15 2050 04/15/15 0757 04/15/15 1239  GLUCAP 243* 189* 228*      Dg Chest 1  View  04/11/2015   CLINICAL DATA:  Lethargy and weakness  EXAM: CHEST  1 VIEW  COMPARISON:  August 22, 2010  FINDINGS: There is airspace consolidation in the left lower lobe. Lungs elsewhere clear. Heart is upper normal in size with pulmonary vascularity within normal limits. There is atherosclerotic change in aorta. 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 No pneumothorax. No adenopathy.  IMPRESSION: Left lower lobe airspace consolidation. Followup PA and lateral chest radiographs recommended in 3-4 weeks following trial of antibiotic therapy to ensure resolution and exclude underlying malignancy.   Electronically Signed   By: Bretta Bang III M.D.   On: 04/11/2015 17:05   Ct Head Wo Contrast  04/11/2015   CLINICAL DATA:  Weakness, fatigue  EXAM: CT HEAD WITHOUT CONTRAST  TECHNIQUE: Contiguous axial images were obtained from the base of the skull through the vertex without intravenous contrast.  COMPARISON:  12/23/2009  FINDINGS: No skull fracture is noted. There is mucosal thickening with partial opacification right ethmoid air cells. The mastoid air cells are unremarkable. No intracranial hemorrhage, mass effect or midline shift. Moderate cerebral atrophy. Mild periventricular and patchy subcortical white matter decreased attenuation probable due to chronic small vessel ischemic changes. Atherosclerotic calcifications of carotid siphon. No acute cortical infarction. No mass lesion is noted on this unenhanced scan.  IMPRESSION: No acute intracranial abnormality. Again noted cerebral atrophy. Periventricular and patchy subcortical white matter decreased attenuation probable due to chronic small vessel ischemic changes. There is mucosal thickening with partial opacification right ethmoid air cells.   Electronically Signed   By: Natasha Mead M.D.   On: 04/11/2015 16:56   Nm Hepatobiliary Liver Func  04/04/2015   CLINICAL DATA:  69 year old male with history of gallstones. Total  bilirubin 1.1.  EXAM: NUCLEAR MEDICINE HEPATOBILIARY IMAGING  TECHNIQUE: Sequential images of the abdomen were obtained out to 60 minutes following intravenous administration of radiopharmaceutical.  RADIOPHARMACEUTICALS:  5.5 mCi Tc-54m Choletec IV  COMPARISON:  CT of the abdomen and pelvis 04/03/2015.  FINDINGS: There is rapid uptake of radiotracer within the hepatic parenchyma. Some gallbladder activity is noted by 10 minutes. Small bowel activity is noted by 15 minutes. Continued accumulation of radiotracer in the hepatic parenchyma and gallbladder, with continued excretion into the small bowel is observed throughout the examination.  IMPRESSION: 1. Normal examination. No findings to suggest cholecystitis or biliary tract obstruction.   Electronically Signed   By: Trudie Reed M.D.   On: 04/04/2015 09:06   Ct Abdomen Pelvis W Contrast  04/03/2015   CLINICAL DATA:  Diarrhea. Recent diagnosis of C diff colitis. Weakness.  EXAM: CT ABDOMEN AND PELVIS WITH CONTRAST  TECHNIQUE: Multidetector CT imaging of the abdomen and pelvis was performed using the standard protocol following bolus  administration of intravenous contrast.  CONTRAST:  OMNIPAQUE IOHEXOL 300 MG/ML  SOLN  COMPARISON:  None available.  FINDINGS: Hypoventilatory change at the lung bases. The heart is enlarged. Pacemaker partially included.  Decreased hepatic density with lobular contours and diminished size the left hepatic lobe, suggestive of cirrhosis. No focal hepatic lesion. There is a periportal lymph node measuring 1 cm in short axis dimension. Calcified gallstones within physiologically distended gallbladder. No biliary dilatation. Small hypodensity in the anterior spleen, likely hemangioma. Spleen is normal in size. The adrenal glands are normal. Mild pancreatic atrophy. There are multiple hypodensities scattered throughout the pancreatic parenchyma. The pancreatic head this measures 1.5 cm, in the distal body 0.9 cm, and in the tail  0.9 cm. Kidneys demonstrate symmetric enhancement and excretion. Small subcentimeter bilateral renal cysts. Within the upper left kidney is a 1.4 cm lesion measuring soft tissue density, Hounsfield units of 38.  Small amount contrast of the distal esophagus. Stomach is decompressed. There are no dilated or thickened bowel loops. Midline ventral hernia contains normal appearing loops of small bowel. There is minimal colonic wall thickening involving the sigmoid colon without significant surrounding pericolonic inflammatory change. The remainder the colon is decompressed. Distal colonic diverticulosis without diverticulitis. The appendix is normal. No free air, free fluid, or intra-abdominal fluid collection.  No retroperitoneal adenopathy. Abdominal aorta is normal in caliber. Moderate atherosclerosis without aneurysm.  Within the pelvis the urinary bladder is distended, air in the bladder is likely related to instrumentation. Prostate gland appears prominent. There is no pelvic free fluid. No pelvic adenopathy. Probable post left inguinal hernia repair.  There are no acute or suspicious osseous abnormalities. Cortical irregularity about the right anterior iliac crest suggestive of remote prior injury. There scattered bone islands.  IMPRESSION: 1. Minimal colonic wall thickening involving the sigmoid colon, may reflect mild colitis. 2. Ventral abdominal wall hernia containing normal appearing loops of small bowel without bowel compromise or obstruction. 3. Nodular hepatic contours, suggestive of underlying cirrhosis. No focal hepatic lesion. 4. Small round hypodensities within the pancreas, largest measuring 1.5 cm. 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 In the absence of prior imaging, nonemergent MRCP could be considered if patient is able tolerate breath hold technique. 5. Cholelithiasis without cholecystitis. 6. Bilateral renal cysts. In addition there is a left  upper pole 1.5 cm lesion measuring higher than simple fluid density. This may reflect a complex or hemorrhagic cyst. Correlation with any prior imaging would be most helpful. In the absence of prior imaging, correlation with contrast-enhanced MRI is recommended.   Electronically Signed   By: Rubye Oaks M.D.   On: 04/03/2015 01:38   US Biopsy  04/06/2015   CLINICAL DATA:  Elevated LFTs, abnormal contour on CT suggesting cirrhosis.  EXAM: ULTRASOUND-GUIDED CORE LIVER BIOPSY  TECHNIQUE: An ultrasound guided liver biopsy was thoroughly discussed with the patient and questions were answered. The benefits, risks, alternatives, and complications were also discussed. The patient understands and wishes to proceed with the procedure. A verbal as well as written consent was obtained.  Survey ultrasound of the liver was performed and an appropriate skin entry site was determined. Skin site was marked, prepped with Betadine, and draped in usual sterile fashion, and infiltrated locally with 1% lidocaine.  Intravenous Fentanyl and Versed were administered as conscious sedation during continuous cardiorespiratory monitoring by the radiology RN, with a total moderate sedation time of less than 30 minutes.  A 17 gauge  trocar needle was advanced under ultrasound guidance into the liver. 3 coaxial 18gauge core samples were then obtained through the guide needle. The guide needle was removed. Post procedure scans demonstrate no apparent complication.  COMPLICATIONS: COMPLICATIONS None immediate  FINDINGS: Survey images of the liver demonstrate no focal lesion or intrahepatic biliary ductal dilatation. Cholelithiasis without gallbladder wall thickening incidentally noted. Core biopsy samples obtained without complication.  IMPRESSION: 1. Technically successful ultrasound guided core liver biopsy.   Electronically Signed   By: Corlis Leak M.D.   On: 04/06/2015 15:33    ASSESSMENT/PLAN:   Generalized weakness - for  rehabilitation HCAP - empirically treated with vancomycin and Zosyn and transition to Augmentin 875-125 milligrams 1 tab by mouth every 12 hours 3 days to complete 1 week course and Robitussin DM 5 mL by mouth every 4 hours when necessary C. difficile colitis - noted to have diarrhea still and Vancomycin is supposed to have been discontinued but will continue vancomycin 125 mg per 2.5 mL by mouth every 6 hours 5 more days and will do stool culture for C. difficile Autoimmune hepatitis - ultrasound-guided biopsy done on 04/06/15 consistent with autoimmune hepatitis; outpatient follow-up with gastroenterology Erosive gastritis - continue Zantac 300 mg 1 tab by mouth twice a day History of DVT - continue adequate 5 mg 1 tab by mouth twice a day CKD stage III - creatinine 1.31; will monitor Hypertension - continue Norvasc 10 mg by mouth daily and metoprolol 50 mg by mouth daily Diabetes mellitus, type II - hemoglobin A1c 7.5; continue Lantus 20 units subcutaneous daily and NovoLog sliding scale SQ ACHS Hypothyroidism - TSH 1.565; continue Synthroid 200 g by mouth daily COPD - continue Symbicort 160-4 0.5 g/ACT 2 puffs into lungs twice a day Allergic rhinitis - continue Zyrtec 10 mg 1 tab by mouth daily Depression - continue Zoloft 200 mg by mouth daily BPH - continue Flomax 0.4 mg 1 capsule by mouth daily Protein calorie malnutrition, severe - albumen 2.2; RD consultation    Goals of care:  Short-term rehabilitation   Labs/test ordered:  CBC, CMP, stool culture for C. difficile   Spent 50 minutes in patient care.    [facility], NP [employer] [phone]

## 2015-04-18 ENCOUNTER — Non-Acute Institutional Stay (SKILLED_NURSING_FACILITY): Payer: Medicare Other | Admitting: Internal Medicine

## 2015-04-18 DIAGNOSIS — R531 Weakness: Secondary | ICD-10-CM | POA: Diagnosis not present

## 2015-04-18 DIAGNOSIS — F322 Major depressive disorder, single episode, severe without psychotic features: Secondary | ICD-10-CM | POA: Diagnosis not present

## 2015-04-18 DIAGNOSIS — A047 Enterocolitis due to Clostridium difficile: Secondary | ICD-10-CM | POA: Diagnosis not present

## 2015-04-18 DIAGNOSIS — N183 Chronic kidney disease, stage 3 unspecified: Secondary | ICD-10-CM

## 2015-04-18 DIAGNOSIS — I10 Essential (primary) hypertension: Secondary | ICD-10-CM

## 2015-04-18 DIAGNOSIS — K754 Autoimmune hepatitis: Secondary | ICD-10-CM

## 2015-04-18 DIAGNOSIS — E039 Hypothyroidism, unspecified: Secondary | ICD-10-CM | POA: Diagnosis not present

## 2015-04-18 DIAGNOSIS — J189 Pneumonia, unspecified organism: Secondary | ICD-10-CM | POA: Diagnosis not present

## 2015-04-18 DIAGNOSIS — E1122 Type 2 diabetes mellitus with diabetic chronic kidney disease: Secondary | ICD-10-CM | POA: Diagnosis not present

## 2015-04-18 DIAGNOSIS — K2900 Acute gastritis without bleeding: Secondary | ICD-10-CM | POA: Diagnosis not present

## 2015-04-18 DIAGNOSIS — Z86718 Personal history of other venous thrombosis and embolism: Secondary | ICD-10-CM

## 2015-04-18 DIAGNOSIS — D638 Anemia in other chronic diseases classified elsewhere: Secondary | ICD-10-CM

## 2015-04-18 DIAGNOSIS — F329 Major depressive disorder, single episode, unspecified: Secondary | ICD-10-CM

## 2015-04-18 DIAGNOSIS — A0472 Enterocolitis due to Clostridium difficile, not specified as recurrent: Secondary | ICD-10-CM

## 2015-04-18 DIAGNOSIS — N189 Chronic kidney disease, unspecified: Secondary | ICD-10-CM

## 2015-04-18 DIAGNOSIS — K2960 Other gastritis without bleeding: Secondary | ICD-10-CM

## 2015-04-18 DIAGNOSIS — E46 Unspecified protein-calorie malnutrition: Secondary | ICD-10-CM | POA: Diagnosis not present

## 2015-04-18 LAB — CULTURE, BLOOD (ROUTINE X 2)
Culture: NO GROWTH
Culture: NO GROWTH

## 2015-04-18 NOTE — Progress Notes (Signed)
Patient ID: [name], male   DOB: 1946/04/17, 69 y.o.   MRN: [id number]     [facility]   PCP: Oneal Grout, MD  Code Status: full code  No Known Allergies  Chief Complaint  Patient presents with  . Readmit To SNF     HPI:  69 year old patient is here for short term rehabilitation post hospital admission from 04/11/15-04/15/15 with altered mental state. He was in the facility for STR prior to this hospital admission. Acute CVA and hepatic encephalopathy were ruled out in the hospital. He was diagnosed to have HCAP and was started on antibiotics and made clinical improvement. He was continued on his antibiotic for c.diff colitis. He is seen in his room today. He is alert and oriented. He continues to have loose stool and his c.diff course has been extended. He is on contact precautions. He has completed course of augmentin for pneumonia. His breathing is stable. Denies any concerns this visit.  Review of Systems:  Constitutional: Negative for fever, chills,diaphoresis. positive for weakness HENT: Negative for headache, congestion, nasal discharge Eyes: Negative for eye pain, blurred vision, double vision and discharge.  Respiratory: Negative for cough, shortness of breath and wheezing.   Cardiovascular: Negative for chest pain, palpitations, leg swelling.  Gastrointestinal: Negative for heartburn, nausea, vomiting, abdominal pain. Has loose stool.  Genitourinary: Negative for dysuria Musculoskeletal: Negative for back pain, falls Skin: Negative for itching, rash.  Neurological: Negative for dizziness, tingling Psychiatric/Behavioral: Negative for depression   Past Medical History  Diagnosis Date  . Diabetes mellitus without complication   . Hypertension   . Presence of permanent cardiac pacemaker   . Hypothyroidism   . Stroke   . DVT (deep vein thrombosis) in pregnancy   . C. difficile colitis    Past Surgical History  Procedure  Laterality Date  . Insert / replace / remove pacemaker    . Tonsillectomy    . Esophagogastroduodenoscopy N/A 04/07/2015    Procedure: ESOPHAGOGASTRODUODENOSCOPY (EGD);  Surgeon: [name] Dare, MD;  Location: [facility];  Service: Endoscopy;  Laterality: N/A;   Social History:   reports that he has never smoked. He does not have any smokeless tobacco history on file. He reports that he does not drink alcohol. His drug history is not on file.  Family History  Problem Relation Age of Onset  . Diabetes Mellitus II Sister   . CAD Sister   . Hypertension Sister     Medications: Patient's Medications  New Prescriptions   No medications on file  Previous Medications   AMLODIPINE (NORVASC) 10 MG TABLET    Take 1 tablet (10 mg total) by mouth daily.   AMOXICILLIN-CLAVULANATE (AUGMENTIN) 875-125 MG PER TABLET    Take 1 tablet by mouth every 12 (twelve) hours. Take for 3 days then stop.   APIXABAN (ELIQUIS) 5 MG TABS TABLET    Take 1 tablet (5 mg total) by mouth 2 (two) times daily.   BRIMONIDINE (ALPHAGAN) 0.2 % OPHTHALMIC SOLUTION    Place 1 drop into the left eye 2 (two) times daily.   BUDESONIDE-FORMOTEROL (SYMBICORT) 160-4.5 MCG/ACT INHALER    Inhale 2 puffs into the lungs 2 (two) times daily.   CETIRIZINE (ZYRTEC) 10 MG TABLET    Take 10 mg by mouth daily.   CHOLECALCIFEROL (VITAMIN D) 1000 UNITS TABLET    Take 2,000 Units by mouth daily.   FEEDING SUPPLEMENT, GLUCERNA SHAKE, (GLUCERNA SHAKE) LIQD    Take 237  mLs by mouth 3 (three) times daily between meals.   GUAIFENESIN-DEXTROMETHORPHAN (ROBITUSSIN DM) 100-10 MG/5ML SYRUP    Take 5 mLs by mouth every 4 (four) hours as needed for cough.   HYDROXYPROPYL METHYLCELLULOSE / HYPROMELLOSE (ISOPTO TEARS / GONIOVISC) 2.5 % OPHTHALMIC SOLUTION    Place 1 drop into both eyes 4 (four) times daily as needed for dry eyes (dry eyes).   INSULIN ASPART (NOVOLOG) 100 UNIT/ML INJECTION    Sliding Scale 70-120 (0 units) 121-150 (1 unit) 151-200(2  units) 201-250 (3 units) 251-300( 5 units)  301-350 (7 units) 351-400 (9 units) >400 call MD   INSULIN GLARGINE (LANTUS) 100 UNIT/ML INJECTION    Inject 0.2 mLs (20 Units total) into the skin at bedtime.   LEVOTHYROXINE (SYNTHROID, LEVOTHROID) 100 MCG TABLET    Take 200 mcg by mouth daily before breakfast.   METOPROLOL SUCCINATE (TOPROL-XL) 50 MG 24 HR TABLET    Take 1 tablet (50 mg total) by mouth daily. Take with or immediately following a meal.   POLYVINYL ALCOHOL (LIQUIFILM TEARS) 1.4 % OPHTHALMIC SOLUTION    Place 1 drop into both eyes daily as needed for dry eyes.   RANITIDINE (ZANTAC) 300 MG TABLET    Take 1 tablet (300 mg total) by mouth 2 (two) times daily.   SACCHAROMYCES BOULARDII (FLORASTOR) 250 MG CAPSULE    Take 1 capsule (250 mg total) by mouth 2 (two) times daily.   SERTRALINE (ZOLOFT) 100 MG TABLET    Take 200 mg by mouth daily.   TAMSULOSIN (FLOMAX) 0.4 MG CAPS CAPSULE    Take 1 capsule (0.4 mg total) by mouth daily.  Modified Medications   No medications on file  Discontinued Medications   No medications on file     Physical Exam: Filed Vitals:   04/18/15 1531  BP: 150/90  Pulse: 70  Temp: 99 F (37.2 C)  Resp: 18  SpO2: 95%    General- elderly male, obese, in no acute distress Head- normocephalic, atraumatic Nose- no maxillary or frontal sinus tenderness, no nasal discharge Throat- moist mucus membrane, adentulous Eyes- no pallor, no icterus, no discharge, normal conjunctiva, normal sclera Neck- no cervical lymphadenopathy, no jugular vein distension Cardiovascular- normal s1,s2, no murmurs, palpable dorsalis pedis, trace leg edema Respiratory- bilateral clear to auscultation, no wheeze, no rhonchi, no crackles, no use of accessory muscles Abdomen- bowel sounds present, soft, non tender Musculoskeletal- able to move all 4 extremities, lower extremity weakness > upper extremity weakness Neurological- aao x 3 Skin- warm and dry Psychiatry- normal mood and  affect    Labs reviewed: Basic Metabolic Panel:  Recent Labs  23/76/28 0719 04/13/15 0810 04/14/15 0825 04/15/15 0544  NA 133* 132* 128* 133*  K 3.5 3.2* 3.5 3.5  CL 100* 100* 96* 100*  CO2 26 25 28 25   GLUCOSE 205* 261* 289* 226*  BUN 20 17 16  22*  CREATININE 1.23 1.10 1.13 1.31*  CALCIUM 8.8* 8.5* 8.4* 8.7*  MG 1.8 1.7 1.9  --    Liver Function Tests:  Recent Labs  04/12/15 0719 04/13/15 0810 04/15/15 0544  AST 564* 496* 522*  ALT 454* 386* 390*  ALKPHOS 569* 613* 735*  BILITOT 2.3* 2.0* 1.7*  PROT 7.4 7.0 6.9  ALBUMIN 2.5* 2.3* 2.2*    Recent Labs  04/02/15 2119  LIPASE 15*   No results for input(s): AMMONIA in the last 8760 hours. CBC:  Recent Labs  04/02/15 2119 04/03/15 3151  04/11/15 1742  04/13/15 0810 04/14/15 0825  04/15/15 0544  WBC 5.7 6.6  < > 7.1  < > 6.4 5.8 7.8  NEUTROABS 2.6 2.8  --  3.6  --   --   --   --   HGB 13.7 13.1  < > 12.7*  < > 12.2* 12.8* 11.8*  HCT 41.1 40.1  < > 37.2*  < > 36.7* 37.2* 35.0*  MCV 86.0 85.5  < > 84.4  < > 84.0 83.4 83.1  PLT 166 167  < > 146*  < > 131* 123* 148*  < > = values in this interval not displayed. Cardiac Enzymes:  Recent Labs  04/03/15 0650 04/04/15 1123 04/04/15 1655 04/04/15 2227  CKTOTAL 45*  --   --   --   TROPONINI 0.41* 0.40* 0.38* 0.38*   BNP: Invalid input(s): POCBNP CBG:  Recent Labs  04/14/15 2050 04/15/15 0757 04/15/15 1239  GLUCAP 243* 189* 228*   04/17/15 wbc 7.6, hb 12.2, hct 37.2, plt 153, na 134, k 3.8, glu 170, bun 16, cr 1.02, t.bil 1.6, alp 626, ast 441, alt 347, alb 2.3, ca 8.5   Radiological Exams:  04/11/15 ct head IMPRESSION: No acute intracranial abnormality. Again noted cerebral atrophy. Periventricular and patchy subcortical white matter decreased attenuation probable due to chronic small vessel ischemic changes. There is mucosal thickening with partial opacification right ethmoid air cells.   04/11/15 cxr IMPRESSION: Left lower lobe airspace  consolidation. Followup PA and lateral chest radiographs recommended in 3-4 weeks following trial of antibiotic therapy to ensure resolution and exclude underlying malignancy.      Assessment/Plan  Generalized weakness Will have him work with physical therapy and occupational therapy team to help with gait training and muscle strengthening exercises.fall precautions. Skin care. Encourage to be out of bed.   HCAP Will complete his course of augmentin today. Lungs are CTAB. Clinically improved. aspiration precautions. Monitor clinically. Will need repeat cxr in 4 weeks to assess for resolution of consolidation. Continue dysphagia diet and florastor  C. difficile colitis  Continue vancomycin until 04/22/15, pending a c.diff pcr.   Protein calorie malnutrition Low albumin level, pending RD consult. Continue medpass.   Diabetes mellitus Recent hemoglobin A1c 7.5. Monitor cbg and continue Lantus 20 units daily and novolog with meals.   HTN Elevated SBP. Check bp q shift for now. Continue norvasc 10 mg daily and metoprolol 50 mg daily for now.  Acute on chronic renal failure Improved creatinine level on recheck, monitor  DVT Stable, continue eliquis 5 mg bid  Hypothyroidism Stable, continue synthroid 200 mcg daily  Autoimmune hepatitis F/u LFT, outpatient follow-up with gastroenterology  Erosive gastritis  continue Zantac 300 mg bid  Depression continue Zoloft 200 mg daily  Anemia of chronic disease Stable, reviewed h&h   Goals of care: short term rehabilitation   Labs/tests ordered: none  Family/ staff Communication: reviewed care plan with patient and nursing supervisor    Oneal Grout, MD  [facility] [phone] (Monday-Friday 8 am - 5 pm) [phone] (afterhours)

## 2015-05-08 ENCOUNTER — Encounter: Payer: Self-pay | Admitting: Gastroenterology

## 2015-05-08 ENCOUNTER — Ambulatory Visit (INDEPENDENT_AMBULATORY_CARE_PROVIDER_SITE_OTHER): Payer: Medicare Other | Admitting: Gastroenterology

## 2015-05-08 ENCOUNTER — Other Ambulatory Visit (INDEPENDENT_AMBULATORY_CARE_PROVIDER_SITE_OTHER): Payer: Medicare Other

## 2015-05-08 VITALS — BP 142/80 | HR 60

## 2015-05-08 DIAGNOSIS — Z7901 Long term (current) use of anticoagulants: Secondary | ICD-10-CM

## 2015-05-08 DIAGNOSIS — A047 Enterocolitis due to Clostridium difficile: Secondary | ICD-10-CM

## 2015-05-08 DIAGNOSIS — R932 Abnormal findings on diagnostic imaging of liver and biliary tract: Secondary | ICD-10-CM

## 2015-05-08 DIAGNOSIS — A0472 Enterocolitis due to Clostridium difficile, not specified as recurrent: Secondary | ICD-10-CM

## 2015-05-08 DIAGNOSIS — K754 Autoimmune hepatitis: Secondary | ICD-10-CM

## 2015-05-08 DIAGNOSIS — K7460 Unspecified cirrhosis of liver: Secondary | ICD-10-CM

## 2015-05-08 LAB — CBC WITH DIFFERENTIAL/PLATELET
Basophils Absolute: 0 10*3/uL (ref 0.0–0.1)
Basophils Relative: 0.5 % (ref 0.0–3.0)
Eosinophils Absolute: 0.3 10*3/uL (ref 0.0–0.7)
Eosinophils Relative: 4 % (ref 0.0–5.0)
HEMATOCRIT: 36.9 % — AB (ref 39.0–52.0)
HEMOGLOBIN: 12.3 g/dL — AB (ref 13.0–17.0)
Lymphocytes Relative: 45.5 % (ref 12.0–46.0)
Lymphs Abs: 3.7 10*3/uL (ref 0.7–4.0)
MCHC: 33.4 g/dL (ref 30.0–36.0)
MCV: 85.8 fl (ref 78.0–100.0)
MONOS PCT: 10 % (ref 3.0–12.0)
Monocytes Absolute: 0.8 10*3/uL (ref 0.1–1.0)
NEUTROS PCT: 40 % — AB (ref 43.0–77.0)
Neutro Abs: 3.2 10*3/uL (ref 1.4–7.7)
Platelets: 147 10*3/uL — ABNORMAL LOW (ref 150.0–400.0)
RBC: 4.3 Mil/uL (ref 4.22–5.81)
RDW: 18.6 % — ABNORMAL HIGH (ref 11.5–15.5)
WBC: 8.1 10*3/uL (ref 4.0–10.5)

## 2015-05-08 LAB — COMPREHENSIVE METABOLIC PANEL
ALK PHOS: 491 U/L — AB (ref 39–117)
ALT: 100 U/L — AB (ref 0–53)
AST: 103 U/L — AB (ref 0–37)
Albumin: 2.4 g/dL — ABNORMAL LOW (ref 3.5–5.2)
BILIRUBIN TOTAL: 0.7 mg/dL (ref 0.2–1.2)
BUN: 20 mg/dL (ref 6–23)
CO2: 33 mEq/L — ABNORMAL HIGH (ref 19–32)
Calcium: 8.9 mg/dL (ref 8.4–10.5)
Chloride: 91 mEq/L — ABNORMAL LOW (ref 96–112)
Creatinine, Ser: 1.23 mg/dL (ref 0.40–1.50)
GFR: 74.53 mL/min (ref >60.00)
GLUCOSE: 330 mg/dL — AB (ref 70–99)
Potassium: 3.6 mEq/L (ref 3.5–5.1)
SODIUM: 128 meq/L — AB (ref 135–145)
Total Protein: 7 g/dL (ref 6.0–8.3)

## 2015-05-08 MED ORDER — SACCHAROMYCES BOULARDII 250 MG PO CAPS: 250.0000 mg | ORAL_CAPSULE | Freq: Two times a day (BID) | ORAL | Status: DC

## 2015-05-08 MED ORDER — VANCOMYCIN HCL 250 MG PO CAPS: ORAL_CAPSULE | ORAL | Status: DC

## 2015-05-08 NOTE — Patient Instructions (Addendum)
Your physician has requested that you go to the basement for lab work before leaving today.  We have printed a prescription for Vancomycin 250 mg one tablet by mouth four times a day x 1 month, then reduce one tablet by mouth twice daily x 10 days, then reduce to one tablet by mouth once daily x 10 days, then stop.   Take your Florastor one tablet by mouth twice daily x 3 months.  cc: Oneal GroutMahima Pandey, MD

## 2015-05-08 NOTE — Progress Notes (Signed)
    History of Present Illness: This is a 69 year old male nursing home resident with multiple significant comorbidities including immobility returning for GI follow-up of C. difficile diarrhea and autoimmune hepatitis after hospitalization in June. Liver biopsy and serologies were typical for autoimmune hepatitis. A nodular hepatic contour was noted on imaging studies concerning for cirrhosis however the liver biopsy showed only periportal and early septal fibrosis. Prednisone/6 MP were not started due to an active C. difficile infection. He completed a course of Augmentin for HCAP. Diarrhea returned shortly after completing a 14 day course of Vanco. Notes 3-4 loose nonbloody urgent stools each day. He has no other gastrointestinal complaints.  Current Medications, Allergies, Past Medical History, Past Surgical History, Family History and Social History were reviewed in [facility] electronic medical record.  Physical Exam: General: Well developed, debilitated, bedridden on a stretcher, no acute distress Head: Normocephalic and atraumatic Eyes:  sclerae anicteric, EOMI Ears: Normal auditory acuity Mouth: No deformity or lesions Lungs: Clear throughout to auscultation Heart: Regular rate and rhythm; no murmurs, rubs or bruits Abdomen: Soft, non tender and non distended. No masses, hepatosplenomegaly or hernias noted. Normal Bowel sounds Musculoskeletal: Symmetrical with no gross deformities  Pulses:  Normal pulses noted Extremities: No clubbing, cyanosis, edema or deformities noted Neurological: Alert oriented x 4, he provided short answers to my questions, L sided weakness and generalized weakness Psychological:  Alert and cooperative. Normal mood and affect  Assessment and Recommendations:  1. C diff diarrhea. Treated with 14 days of Vancomycin, had a course of Augmentin and diarrhea returned. Retreat with a prolonged course of Vanco and a three-month course of Florastor. If C diff  diarrhea does not resolve with the above he will need ID referral.   2. Autoimmune hepatitis by serologies and liver biopsy with mild fibrosis without cirrhosis on liver biopsy in 03/2015. No varices on EGD in 03/2015. Check TPMT, CBC, CMET. No plans for prednisone/6 MP until C diff has resolved.   3. Erosive gastroduodenitis. Continue ranitidine 300 mg po bid. Avoid or minimize NSAIDs.   4. Pancreatic hypodensities on CT. Etiology unclear. Possibly IPMN. May need EUS at some point. Likely would not be able to fully cooperate with MRI.   5. History of DVT on Eliquis.  6. History of CVA.  7. DM

## 2015-05-11 ENCOUNTER — Telehealth: Payer: Self-pay | Admitting: Gastroenterology

## 2015-05-11 NOTE — Telephone Encounter (Signed)
All questions answered for the wife about c-diff.  She reports that the patient is telling everyone he has cancer.  She is asking if he does.  I advised that I can't find any evidence in his chart with a cancer diagnosis.

## 2015-05-13 LAB — BASIC METABOLIC PANEL
BUN: 14 mg/dL (ref 4–21)
Creatinine: 1.2 mg/dL (ref 0.6–1.3)
Glucose: 147 mg/dL
Potassium: 3.5 mmol/L (ref 3.4–5.3)
Sodium: 134 mmol/L — AB (ref 137–147)

## 2015-05-13 LAB — THIOPURINE METHYLTRANSFERASE (TPMT), RBC: Thiopurine Methyltransferase, RBC: 16 nmol/hr/mL RBC

## 2015-05-24 ENCOUNTER — Encounter: Payer: Self-pay | Admitting: Adult Health

## 2015-05-24 ENCOUNTER — Non-Acute Institutional Stay (SKILLED_NURSING_FACILITY): Payer: Medicare Other | Admitting: Adult Health

## 2015-05-24 DIAGNOSIS — K754 Autoimmune hepatitis: Secondary | ICD-10-CM

## 2015-05-24 DIAGNOSIS — K2900 Acute gastritis without bleeding: Secondary | ICD-10-CM | POA: Diagnosis not present

## 2015-05-24 DIAGNOSIS — E1122 Type 2 diabetes mellitus with diabetic chronic kidney disease: Secondary | ICD-10-CM

## 2015-05-24 DIAGNOSIS — F32A Depression, unspecified: Secondary | ICD-10-CM

## 2015-05-24 DIAGNOSIS — N189 Chronic kidney disease, unspecified: Secondary | ICD-10-CM

## 2015-05-24 DIAGNOSIS — R531 Weakness: Secondary | ICD-10-CM

## 2015-05-24 DIAGNOSIS — J309 Allergic rhinitis, unspecified: Secondary | ICD-10-CM | POA: Diagnosis not present

## 2015-05-24 DIAGNOSIS — A047 Enterocolitis due to Clostridium difficile: Secondary | ICD-10-CM | POA: Diagnosis not present

## 2015-05-24 DIAGNOSIS — A0472 Enterocolitis due to Clostridium difficile, not specified as recurrent: Secondary | ICD-10-CM

## 2015-05-24 DIAGNOSIS — Z86718 Personal history of other venous thrombosis and embolism: Secondary | ICD-10-CM | POA: Diagnosis not present

## 2015-05-24 DIAGNOSIS — N183 Chronic kidney disease, stage 3 unspecified: Secondary | ICD-10-CM

## 2015-05-24 DIAGNOSIS — E039 Hypothyroidism, unspecified: Secondary | ICD-10-CM

## 2015-05-24 DIAGNOSIS — J449 Chronic obstructive pulmonary disease, unspecified: Secondary | ICD-10-CM

## 2015-05-24 DIAGNOSIS — I10 Essential (primary) hypertension: Secondary | ICD-10-CM

## 2015-05-24 DIAGNOSIS — F329 Major depressive disorder, single episode, unspecified: Secondary | ICD-10-CM

## 2015-05-24 DIAGNOSIS — E46 Unspecified protein-calorie malnutrition: Secondary | ICD-10-CM

## 2015-05-24 DIAGNOSIS — K2960 Other gastritis without bleeding: Secondary | ICD-10-CM

## 2015-05-24 DIAGNOSIS — N400 Enlarged prostate without lower urinary tract symptoms: Secondary | ICD-10-CM

## 2015-05-24 NOTE — Progress Notes (Signed)
Patient ID: [name], male   DOB: Jun 05, 1946, 69 y.o.   MRN: [id number]   05/24/2015  Facility:  Nursing Home Location:  [facility] Room Number: 602-P LEVEL OF CARE:  SNF (31)   Chief Complaint  Patient presents with  . Medical Management of Chronic Issues    Generalized weakness, C. difficile colitis, autoimmune hepatitis, erosive gastritis, history of DVT, CKD stage III, hypertension, diabetes mellitus, hypothyroidism, COPD, allergic rhinitis, depression, BPH and protein calorie malnutrition       HISTORY OF PRESENT ILLNESS:  This is a 69 year old male who is being seen for a routine visit. Lantus was recently increased to 28 units SQ Q HS. He was recently re-started on Vancomycin due to another C-difficile colitis. He is still having generalized weakness and needing extensive assistance.  He has been readmitted to [facility] on 04/15/15 from [facility]. He has PMH of diabetes mellitus type 2, history of stroke, CKD stage III, permanent pacemaker, history of DVT and currently on Eliquis, hypothyroidism and COPD. He was transferred to the hospital from [facility] due to lethargy and confusion. There was concern of possible stroke. He was diagnosed and treated for HCAP. CT was negative for abnormalities. Chest x-ray showed left lower lobe infiltrates.  He is currently continuing short-term rehabilitation.  PAST MEDICAL HISTORY:  Past Medical History  Diagnosis Date  . Diabetes mellitus without complication   . Hypertension   . Presence of permanent cardiac pacemaker   . Hypothyroidism   . Stroke   . DVT (deep vein thrombosis) in pregnancy   . C. difficile colitis   . Autoimmune hepatitis   . Cirrhosis     CURRENT MEDICATIONS: Reviewed per MAR/see medication list    Medication List       This list is accurate as of: 05/24/15  7:33 PM.  Always use your most recent med list.               amLODipine 10 MG tablet  Commonly  known as:  NORVASC  Take 1 tablet (10 mg total) by mouth daily.     apixaban 5 MG Tabs tablet  Commonly known as:  ELIQUIS  Take 1 tablet (5 mg total) by mouth 2 (two) times daily.     brimonidine 0.2 % ophthalmic solution  Commonly known as:  ALPHAGAN  Place 1 drop into the left eye 2 (two) times daily.     budesonide-formoterol 160-4.5 MCG/ACT inhaler  Commonly known as:  SYMBICORT  Inhale 2 puffs into the lungs 2 (two) times daily.     cetirizine 10 MG tablet  Commonly known as:  ZYRTEC  Take 10 mg by mouth daily.     cholecalciferol 1000 UNITS tablet  Commonly known as:  VITAMIN D  Take 2,000 Units by mouth daily.     DECUBI-VITE PO  Take 1 tablet by mouth daily.     feeding supplement (GLUCERNA SHAKE) Liqd  Take 237 mLs by mouth 3 (three) times daily between meals.     hydroxypropyl methylcellulose / hypromellose 2.5 % ophthalmic solution  Commonly known as:  ISOPTO TEARS / GONIOVISC  Place 1 drop into both eyes 4 (four) times daily as needed for dry eyes (dry eyes).     IMODIUM A-D 2 MG tablet  Generic drug:  loperamide  Take 2 mg by mouth 3 (three) times daily as needed for diarrhea or loose stools.     insulin aspart 100 UNIT/ML  injection  Commonly known as:  novoLOG  Sliding Scale 70-120 (0 units) 121-150 (1 unit) 151-200(2 units) 201-250 (3 units) 251-300( 5 units)  301-350 (7 units) 351-400 (9 units) >400 call MD     insulin glargine 100 UNIT/ML injection  Commonly known as:  LANTUS  Inject 0.2 mLs (20 Units total) into the skin at bedtime.     latanoprost 0.005 % ophthalmic solution  Commonly known as:  XALATAN  1 drop at bedtime.     levothyroxine 100 MCG tablet  Commonly known as:  SYNTHROID, LEVOTHROID  Take 200 mcg by mouth daily before breakfast.     metoprolol succinate 50 MG 24 hr tablet  Commonly known as:  TOPROL-XL  Take 1 tablet (50 mg total) by mouth daily. Take with or immediately following a meal.     polyvinyl alcohol 1.4 %  ophthalmic solution  Commonly known as:  LIQUIFILM TEARS  Place 1 drop into both eyes daily as needed for dry eyes.     ranitidine 300 MG tablet  Commonly known as:  ZANTAC  Take 1 tablet (300 mg total) by mouth 2 (two) times daily.     saccharomyces boulardii 250 MG capsule  Commonly known as:  FLORASTOR  Take 1 capsule (250 mg total) by mouth 2 (two) times daily.     sertraline 100 MG tablet  Commonly known as:  ZOLOFT  Take 200 mg by mouth daily.     tamsulosin 0.4 MG Caps capsule  Commonly known as:  FLOMAX  Take 1 capsule (0.4 mg total) by mouth daily.     vancomycin 250 MG capsule  Commonly known as:  VANCOCIN  250 mg one tablet by mouth four times a day x 1 month, then reduce one tablet by mouth twice daily x 10 days, then reduce to one tablet by mouth once daily x 10 days, then stop.        No Known Allergies  REVIEW OF SYSTEMS:  GENERAL: no change in appetite, no fatigue, no weight changes, no fever, chills or weakness RESPIRATORY: no cough, SOB, DOE, wheezing, hemoptysis CARDIAC: no chest pain, edema or palpitations GI: no abdominal pain, constipation, heart burn, nausea or vomiting, + diarrhea  PHYSICAL EXAMINATION  GENERAL: no acute distress EYES: conjunctivae normal, sclerae normal, normal eye lids NECK: supple, trachea midline, no neck masses, no thyroid tenderness, no thyromegaly LYMPHATICS: no LAN in the neck, no supraclavicular LAN RESPIRATORY: breathing is even & unlabored, BS CTAB CARDIAC: RRR, no murmur,no extra heart sounds, no edema, left chest pacemaker GI: abdomen soft, normal BS, no masses, no tenderness, no hepatomegaly, no splenomegaly EXTREMITIES:  Able to move 4 extremities but has generalized weakness of bilateral lower extremities PSYCHIATRIC: the patient is alert & oriented to person, affect & behavior appropriate  LABS/RADIOLOGY: Labs reviewed: 05/13/15  sodium 134 potassium 2.5 glucose 147 BUN 14 creatinine 1.16 calcium 8.6 05/09/15   chest x-ray shows central pulmonary venous congestion without frank pulmonary edema 04/22/15  stool culture shows no salmonella, shigella, Campylobacter, Yersinia, or no Escherichia coli isolated Basic Metabolic Panel:  Recent Labs  96/04/54 0719 04/13/15 0810 04/14/15 0825 04/15/15 0544 05/08/15 1618  NA 133* 132* 128* 133* 128*  K 3.5 3.2* 3.5 3.5 3.6  CL 100* 100* 96* 100* 91*  CO2 33*  GLUCOSE 205* 261* 289* 226* 330*  BUN 22* 20  CREATININE 1.23 1.10 1.13 1.31* 1.23  CALCIUM 8.8* 8.5* 8.4* 8.7* 8.9  MG  1.8 1.7 1.9  --   --    Liver Function Tests:  Recent Labs  04/13/15 0810 04/15/15 0544 05/08/15 1618  AST 496* 522* 103*  ALT 386* 390* 100*  ALKPHOS 613* 735* 491*  BILITOT 2.0* 1.7* 0.7  PROT 7.0 6.9 7.0  ALBUMIN 2.3* 2.2* 2.4*    Recent Labs  04/02/15 2119  LIPASE 15*   CBC:  Recent Labs  04/03/15 0650  04/11/15 1742  04/14/15 0825 04/15/15 0544 05/08/15 1618  WBC 6.6  < > 7.1  < > 5.8 7.8 8.1  NEUTROABS 2.8  --  3.6  --   --   --  3.2  HGB 13.1  < > 12.7*  < > 12.8* 11.8* 12.3*  HCT 40.1  < > 37.2*  < > 37.2* 35.0* 36.9*  MCV 85.5  < > 84.4  < > 83.4 83.1 85.8  PLT 167  < > 146*  < > 123* 148* 147.0*  < > = values in this interval not displayed.  Lipid Panel:  Recent Labs  04/12/15 0719  HDL 13*   Cardiac Enzymes:  Recent Labs  04/03/15 0650 04/04/15 1123 04/04/15 1655 04/04/15 2227  CKTOTAL 45*  --   --   --   TROPONINI 0.41* 0.40* 0.38* 0.38*   CBG:  Recent Labs  04/14/15 2050 04/15/15 0757 04/15/15 1239  GLUCAP 243* 189* 228*    Dg Chest 1 View  04/11/2015   CLINICAL DATA:  Lethargy and weakness  EXAM: CHEST  1 VIEW  COMPARISON:  August 22, 2010  FINDINGS: There is airspace consolidation in the left lower lobe. Lungs elsewhere clear. Heart is upper normal in size with pulmonary vascularity within normal limits. There is atherosclerotic change in aorta. Pacemaker leads are attached to the right  atrium and right ventricle. No pneumothorax. No adenopathy.  IMPRESSION: Left lower lobe airspace consolidation. Followup PA and lateral chest radiographs recommended in 3-4 weeks following trial of antibiotic therapy to ensure resolution and exclude underlying malignancy.   Electronically Signed   By: Bretta Bang III M.D.   On: 04/11/2015 17:05   Ct Head Wo Contrast  04/11/2015   CLINICAL DATA:  Weakness, fatigue  EXAM: CT HEAD WITHOUT CONTRAST  TECHNIQUE: Contiguous axial images were obtained from the base of the skull through the vertex without intravenous contrast.  COMPARISON:  12/23/2009  FINDINGS: No skull fracture is noted. There is mucosal thickening with partial opacification right ethmoid air cells. The mastoid air cells are unremarkable. No intracranial hemorrhage, mass effect or midline shift. Moderate cerebral atrophy. Mild periventricular and patchy subcortical white matter decreased attenuation probable due to chronic small vessel ischemic changes. Atherosclerotic calcifications of carotid siphon. No acute cortical infarction. No mass lesion is noted on this unenhanced scan.  IMPRESSION: No acute intracranial abnormality. Again noted cerebral atrophy. Periventricular and patchy subcortical white matter decreased attenuation probable due to chronic small vessel ischemic changes. There is mucosal thickening with partial opacification right ethmoid air cells.   Electronically Signed   By: Natasha Mead M.D.   On: 04/11/2015 16:56    ASSESSMENT/PLAN:  Generalized weakness - continue rehabilitation  C. difficile colitis -  continue vancomycin   Autoimmune hepatitis - ultrasound-guided biopsy done on 04/06/15 consistent with autoimmune hepatitis;  follow-up with gastroenterology, Dr. Russella Dar  Erosive gastritis - continue Zantac 300 mg 1 tab by mouth twice a day  History of DVT - continue Eliquis 5 mg 1 tab by mouth twice a day  CKD stage III - creatinine 1.23; will monitor  Hypertension  - continue Norvasc 10 mg by mouth daily and metoprolol 50 mg by mouth daily  Diabetes mellitus, type II - hemoglobin A1c 7.5; recently increased Lantus to 28 units subcutaneous daily and NovoLog sliding scale SQ ACHS  Hypothyroidism - TSH 1.565; continue Synthroid 200 g by mouth daily  COPD - continue Symbicort 160-4 0.5 g/ACT 2 puffs into lungs twice a day  Allergic rhinitis - continue Zyrtec 10 mg 1 tab by mouth daily  Depression - mood is stable ; continue Zoloft 200 mg by mouth daily  BPH - continue Flomax 0.4 mg 1 capsule by mouth daily  Protein calorie malnutrition, severe - albumen 2.2; continue supplementation    Goals of care:  Short-term rehabilitation     [facility], NP [facility] [phone]

## 2015-05-29 ENCOUNTER — Non-Acute Institutional Stay (SKILLED_NURSING_FACILITY): Payer: Medicare Other | Admitting: Adult Health

## 2015-05-29 DIAGNOSIS — J309 Allergic rhinitis, unspecified: Secondary | ICD-10-CM

## 2015-05-29 DIAGNOSIS — Z86718 Personal history of other venous thrombosis and embolism: Secondary | ICD-10-CM | POA: Diagnosis not present

## 2015-05-29 DIAGNOSIS — E039 Hypothyroidism, unspecified: Secondary | ICD-10-CM | POA: Diagnosis not present

## 2015-05-29 DIAGNOSIS — E46 Unspecified protein-calorie malnutrition: Secondary | ICD-10-CM

## 2015-05-29 DIAGNOSIS — N189 Chronic kidney disease, unspecified: Secondary | ICD-10-CM

## 2015-05-29 DIAGNOSIS — N183 Chronic kidney disease, stage 3 unspecified: Secondary | ICD-10-CM

## 2015-05-29 DIAGNOSIS — I10 Essential (primary) hypertension: Secondary | ICD-10-CM | POA: Diagnosis not present

## 2015-05-29 DIAGNOSIS — K754 Autoimmune hepatitis: Secondary | ICD-10-CM | POA: Diagnosis not present

## 2015-05-29 DIAGNOSIS — A0472 Enterocolitis due to Clostridium difficile, not specified as recurrent: Secondary | ICD-10-CM

## 2015-05-29 DIAGNOSIS — J449 Chronic obstructive pulmonary disease, unspecified: Secondary | ICD-10-CM

## 2015-05-29 DIAGNOSIS — E1122 Type 2 diabetes mellitus with diabetic chronic kidney disease: Secondary | ICD-10-CM

## 2015-05-29 DIAGNOSIS — N400 Enlarged prostate without lower urinary tract symptoms: Secondary | ICD-10-CM

## 2015-05-29 DIAGNOSIS — A047 Enterocolitis due to Clostridium difficile: Secondary | ICD-10-CM

## 2015-05-29 DIAGNOSIS — K2900 Acute gastritis without bleeding: Secondary | ICD-10-CM

## 2015-05-29 DIAGNOSIS — F329 Major depressive disorder, single episode, unspecified: Secondary | ICD-10-CM | POA: Diagnosis not present

## 2015-05-29 DIAGNOSIS — F32A Depression, unspecified: Secondary | ICD-10-CM

## 2015-05-29 DIAGNOSIS — K2960 Other gastritis without bleeding: Secondary | ICD-10-CM

## 2015-06-03 ENCOUNTER — Encounter: Payer: Self-pay | Admitting: Adult Health

## 2015-06-03 NOTE — Progress Notes (Addendum)
Patient ID: [name], male   DOB: 25-Apr-1946, 69 y.o.   MRN: [id number]   05/29/15  Facility:  Nursing Home Location:  [facility] Room Number: 602-P LEVEL OF CARE:  SNF (31)    Chief Complaint  Patient presents with  . Discharge Note  C. difficile colitis, autoimmune hepatitis, erosive gastritis, history of DVT, CKD stage III, hypertension, diabetes mellitus, hypothyroidism, COPD, allergic rhinitis, depression, BPH and protein calorie malnutrition       HISTORY OF PRESENT ILLNESS:  This is a 69 year old male who is being transferred to LTC facility.   He has been readmitted to [facility] on 04/15/15 from [facility]. He has PMH of diabetes mellitus type 2, history of stroke, CKD stage III, permanent pacemaker, history of DVT and currently on Eliquis, hypothyroidism and COPD. He was transferred to the hospital from [facility] due to lethargy and confusion. There was concern of possible stroke. He was diagnosed and treated for HCAP. CT was negative for abnormalities. Chest x-ray showed left lower lobe infiltrates.  He is still currently treated with Vancomycin for C-difficile.  Patient was admitted to this facility for short-term rehabilitation after the patient's recent hospitalization.  Patient has completed SNF rehabilitation.  PAST MEDICAL HISTORY:  Past Medical History  Diagnosis Date  . Diabetes mellitus without complication   . Hypertension   . Presence of permanent cardiac pacemaker   . Hypothyroidism   . Stroke   . DVT (deep vein thrombosis) in pregnancy   . C. difficile colitis   . Autoimmune hepatitis   . Cirrhosis     CURRENT MEDICATIONS: Reviewed per MAR/see medication list Current Outpatient Prescriptions on File Prior to Visit  Medication Sig Dispense Refill  . amLODipine (NORVASC) 10 MG tablet Take 1 tablet (10 mg total) by mouth daily.    Marland Kitchen apixaban (ELIQUIS) 5 MG TABS tablet Take 1 tablet (5 mg total) by mouth 2  (two) times daily. 60 tablet   . brimonidine (ALPHAGAN) 0.2 % ophthalmic solution Place 1 drop into the left eye 2 (two) times daily.    . budesonide-formoterol (SYMBICORT) 160-4.5 MCG/ACT inhaler Inhale 2 puffs into the lungs 2 (two) times daily.    . cetirizine (ZYRTEC) 10 MG tablet Take 10 mg by mouth daily.    . cholecalciferol (VITAMIN D) 1000 UNITS tablet Take 2,000 Units by mouth daily.    . hydroxypropyl methylcellulose / hypromellose (ISOPTO TEARS / GONIOVISC) 2.5 % ophthalmic solution Place 1 drop into both eyes 4 (four) times daily as needed for dry eyes (dry eyes).    . insulin aspart (NOVOLOG) 100 UNIT/ML injection Sliding Scale 70-120 (0 units) 121-150 (1 unit) 151-200(2 units) 201-250 (3 units) 251-300( 5 units)  301-350 (7 units) 351-400 (9 units) >400 call MD 10 mL 11  . insulin glargine (LANTUS) 100 UNIT/ML injection Inject 0.28 mLs (28 Units total) into the skin at bedtime. 10 mL 0  . latanoprost (XALATAN) 0.005 % ophthalmic solution Place 1 drop into both eyes at bedtime.     Marland Kitchen levothyroxine (SYNTHROID, LEVOTHROID) 100 MCG tablet Take 200 mcg by mouth daily before breakfast.    . loperamide (IMODIUM A-D) 2 MG tablet Take 2 mg by mouth 3 (three) times daily as needed for diarrhea or loose stools.    . metoprolol succinate (TOPROL-XL) 50 MG 24 hr tablet Take 1 tablet (50 mg total) by mouth daily. Take with or immediately following a meal. 30 tablet 0  .  Multiple Vitamins-Minerals (DECUBI-VITE PO) Take 1 tablet by mouth daily.    . polyvinyl alcohol (LIQUIFILM TEARS) 1.4 % ophthalmic solution Place 1 drop into both eyes daily as needed for dry eyes. 15 mL 0  . Protein (PROCEL) POWD Take by mouth. Mix 1 scoop in 6-8 oz of liquid and take by mouth twice daily    . ranitidine (ZANTAC) 300 MG tablet Take 1 tablet (300 mg total) by mouth 2 (two) times daily.    Marland Kitchen saccharomyces boulardii (FLORASTOR) 250 MG capsule Take 1 capsule (250 mg total) by mouth 2 (two) times daily. 60  capsule 2  . sertraline (ZOLOFT) 100 MG tablet Take 200 mg by mouth daily.    . tamsulosin (FLOMAX) 0.4 MG CAPS capsule Take 1 capsule (0.4 mg total) by mouth daily. 30 capsule   . vancomycin (VANCOCIN) 250 MG capsule 250 mg one tablet by mouth four times a day x 1 month, then reduce one tablet by mouth twice daily x 10 days, then reduce to one tablet by mouth once daily x 10 days, then stop. 120 capsule 1   No current facility-administered medications on file prior to visit.     No Known Allergies  REVIEW OF SYSTEMS:  GENERAL: no change in appetite, no fatigue, no weight changes, no fever, chills or weakness RESPIRATORY: no cough, SOB, DOE, wheezing, hemoptysis CARDIAC: no chest pain, edema or palpitations GI: no abdominal pain, constipation, heart burn, nausea or vomiting  PHYSICAL EXAMINATION  GENERAL: no acute distress EYES: conjunctivae normal, sclerae normal, normal eye lids NECK: supple, trachea midline, no neck masses, no thyroid tenderness, no thyromegaly LYMPHATICS: no LAN in the neck, no supraclavicular LAN RESPIRATORY: breathing is even & unlabored, BS CTAB CARDIAC: RRR, no murmur,no extra heart sounds, no edema, left chest pacemaker GI: abdomen soft, normal BS, no masses, no tenderness, no hepatomegaly, no splenomegaly EXTREMITIES:  Able to move 4 extremities but has generalized weakness of bilateral lower extremities PSYCHIATRIC: the patient is alert & oriented to person, affect & behavior appropriate  LABS/RADIOLOGY: Labs reviewed: 05/09/15  chest x-ray shows central pulmonary venous congestion without frank pulmonary edema 04/22/15  stool culture shows no salmonella, shigella, Campylobacter, Yersinia, or no Escherichia coli isolated Basic Metabolic Panel:  Recent Labs  16/10/96 0719 04/13/15 0810 04/14/15 0825 04/15/15 0544 05/08/15 1618 05/13/15  NA 133* 132* 128* 133* 128* 134*  K 3.5 3.2* 3.5 3.5 3.6 3.5  CL 100* 100* 96* 100* 91*  --   CO2 26 25 28 25   33*  --   GLUCOSE 205* 261* 289* 226* 330*  --   BUN 20 17 16  22* 20 14  CREATININE 1.23 1.10 1.13 1.31* 1.23 1.2  CALCIUM 8.8* 8.5* 8.4* 8.7* 8.9  --   MG 1.8 1.7 1.9  --   --   --    Liver Function Tests:  Recent Labs  04/13/15 0810 04/15/15 0544 05/08/15 1618  AST 496* 522* 103*  ALT 386* 390* 100*  ALKPHOS 613* 735* 491*  BILITOT 2.0* 1.7* 0.7  PROT 7.0 6.9 7.0  ALBUMIN 2.3* 2.2* 2.4*    Recent Labs  04/02/15 2119  LIPASE 15*   CBC:  Recent Labs  04/03/15 0650  04/11/15 1742  04/14/15 0825 04/15/15 0544 05/08/15 1618  WBC 6.6  < > 7.1  < > 5.8 7.8 8.1  NEUTROABS 2.8  --  3.6  --   --   --  3.2  HGB 13.1  < > 12.7*  < >  12.8* 11.8* 12.3*  HCT 40.1  < > 37.2*  < > 37.2* 35.0* 36.9*  MCV 85.5  < > 84.4  < > 83.4 83.1 85.8  PLT 167  < > 146*  < > 123* 148* 147.0*  < > = values in this interval not displayed.  Lipid Panel:  Recent Labs  04/12/15 0719  HDL 13*   Cardiac Enzymes:  Recent Labs  04/03/15 0650 04/04/15 1123 04/04/15 1655 04/04/15 2227  CKTOTAL 45*  --   --   --   TROPONINI 0.41* 0.40* 0.38* 0.38*   CBG:  Recent Labs  04/14/15 2050 04/15/15 0757 04/15/15 1239  GLUCAP 243* 189* 228*   05/09/15  CXR shows central pulmonary venous congestion without frank pulmonary edema; chronic chest findings are present  Dg Chest 1 View  04/11/2015   CLINICAL DATA:  Lethargy and weakness  EXAM: CHEST  1 VIEW  COMPARISON:  August 22, 2010  FINDINGS: There is airspace consolidation in the left lower lobe. Lungs elsewhere clear. Heart is upper normal in size with pulmonary vascularity within normal limits. There is atherosclerotic change in aorta. 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 No pneumothorax. No adenopathy.  IMPRESSION: Left lower lobe airspace consolidation. Followup PA and lateral chest radiographs recommended in 3-4 weeks following trial of antibiotic therapy to ensure resolution and exclude underlying  malignancy.   Electronically Signed   By: Bretta Bang III M.D.   On: 04/11/2015 17:05   Ct Head Wo Contrast  04/11/2015   CLINICAL DATA:  Weakness, fatigue  EXAM: CT HEAD WITHOUT CONTRAST  TECHNIQUE: Contiguous axial images were obtained from the base of the skull through the vertex without intravenous contrast.  COMPARISON:  12/23/2009  FINDINGS: No skull fracture is noted. There is mucosal thickening with partial opacification right ethmoid air cells. The mastoid air cells are unremarkable. No intracranial hemorrhage, mass effect or midline shift. Moderate cerebral atrophy. Mild periventricular and patchy subcortical white matter decreased attenuation probable due to chronic small vessel ischemic changes. Atherosclerotic calcifications of carotid siphon. No acute cortical infarction. No mass lesion is noted on this unenhanced scan.  IMPRESSION: No acute intracranial abnormality. Again noted cerebral atrophy. Periventricular and patchy subcortical white matter decreased attenuation probable due to chronic small vessel ischemic changes. There is mucosal thickening with partial opacification right ethmoid air cells.   Electronically Signed   By: Natasha Mead M.D.   On: 04/11/2015 16:56     ASSESSMENT/PLAN:  C. difficile colitis -  continue vancomycin   Autoimmune hepatitis - ultrasound-guided biopsy done on 04/06/15 consistent with autoimmune hepatitis;  follow-up with gastroenterology, Dr. Russella Dar  Erosive gastritis - continue Zantac 300 mg 1 tab by mouth twice a day  History of DVT - continue Eliquis 5 mg 1 tab by mouth twice a day  CKD stage III - creatinine 1.23; stable  Hypertension - continue Norvasc 10 mg by mouth daily and metoprolol 50 mg by mouth daily  Diabetes mellitus, type II - hemoglobin A1c 7.5; recently increased Lantus to 28 units subcutaneous daily and NovoLog sliding scale SQ ACHS  Hypothyroidism - TSH 1.565; continue Synthroid 200 g by mouth daily  COPD - continue  Symbicort 160-4 0.5 g/ACT 2 puffs into lungs twice a day  Allergic rhinitis - continue Zyrtec 10 mg 1 tab by mouth daily  Depression - mood is stable ; continue Zoloft 200 mg by mouth daily  BPH - continue Flomax 0.4 mg 1 capsule by  mouth daily  Protein calorie malnutrition, severe - albumin 2.4; continue supplementation     I have filled out patient's discharge paperwork and written prescriptions.  Patient will be transferred to LTC facility.  Total discharge time: Less than 30 minutes  Discharge time involved coordination of the discharge process with Child psychotherapist, nursing staff and therapy department.    [facility], NP [employer] [phone]

## 2015-12-05 DIAGNOSIS — E039 Hypothyroidism, unspecified: Secondary | ICD-10-CM | POA: Diagnosis not present

## 2016-05-30 ENCOUNTER — Other Ambulatory Visit
Admission: RE | Admit: 2016-05-30 | Discharge: 2016-05-30 | Disposition: A | Discharge status: Home / Self Care | Payer: Medicare Other | Source: Ambulatory Visit | Attending: Physician Assistant | Admitting: Physician Assistant

## 2016-05-30 DIAGNOSIS — I10 Essential (primary) hypertension: Secondary | ICD-10-CM | POA: Insufficient documentation

## 2016-05-30 LAB — URINALYSIS COMPLETE WITH MICROSCOPIC (ARMC ONLY)
Bilirubin Urine: NEGATIVE
Glucose, UA: NEGATIVE mg/dL
Ketones, ur: NEGATIVE mg/dL
Nitrite: NEGATIVE
PH: 5 (ref 5.0–8.0)
Protein, ur: >500 mg/dL — AB
Specific Gravity, Urine: 1.015 (ref 1.005–1.030)

## 2016-06-01 ENCOUNTER — Other Ambulatory Visit
Admission: RE | Admit: 2016-06-01 | Discharge: 2016-06-01 | Disposition: A | Discharge status: Home / Self Care | Payer: Medicare Other | Source: Ambulatory Visit | Attending: Family Medicine | Admitting: Family Medicine

## 2016-06-01 DIAGNOSIS — I10 Essential (primary) hypertension: Secondary | ICD-10-CM | POA: Diagnosis not present

## 2016-06-01 DIAGNOSIS — J449 Chronic obstructive pulmonary disease, unspecified: Secondary | ICD-10-CM | POA: Diagnosis not present

## 2016-06-01 DIAGNOSIS — E1121 Type 2 diabetes mellitus with diabetic nephropathy: Secondary | ICD-10-CM | POA: Diagnosis not present

## 2016-06-01 DIAGNOSIS — I509 Heart failure, unspecified: Secondary | ICD-10-CM | POA: Diagnosis not present

## 2016-06-01 LAB — URINALYSIS COMPLETE WITH MICROSCOPIC (ARMC ONLY)
BILIRUBIN URINE: NEGATIVE
Glucose, UA: NEGATIVE mg/dL
KETONES UR: NEGATIVE mg/dL
NITRITE: NEGATIVE
PH: 7 (ref 5.0–8.0)
Protein, ur: >500 mg/dL — AB
Specific Gravity, Urine: 1.012 (ref 1.005–1.030)
Squamous Epithelial / LPF: NONE SEEN

## 2016-06-03 LAB — URINE CULTURE: Culture: >=100000 — AB

## 2016-06-10 ENCOUNTER — Emergency Department: Payer: Medicare Other

## 2016-06-10 ENCOUNTER — Encounter: Payer: Self-pay | Admitting: Emergency Medicine

## 2016-06-10 ENCOUNTER — Emergency Department
Admission: EM | Admit: 2016-06-10 | Discharge: 2016-06-10 | Disposition: A | Discharge status: Home / Self Care | Payer: Medicare Other | Attending: Emergency Medicine | Admitting: Emergency Medicine

## 2016-06-10 DIAGNOSIS — N183 Chronic kidney disease, stage 3 (moderate): Secondary | ICD-10-CM | POA: Insufficient documentation

## 2016-06-10 DIAGNOSIS — I129 Hypertensive chronic kidney disease with stage 1 through stage 4 chronic kidney disease, or unspecified chronic kidney disease: Secondary | ICD-10-CM | POA: Insufficient documentation

## 2016-06-10 DIAGNOSIS — E039 Hypothyroidism, unspecified: Secondary | ICD-10-CM | POA: Diagnosis not present

## 2016-06-10 DIAGNOSIS — Z7401 Bed confinement status: Secondary | ICD-10-CM | POA: Diagnosis not present

## 2016-06-10 DIAGNOSIS — R945 Abnormal results of liver function studies: Secondary | ICD-10-CM | POA: Diagnosis not present

## 2016-06-10 DIAGNOSIS — E1122 Type 2 diabetes mellitus with diabetic chronic kidney disease: Secondary | ICD-10-CM | POA: Insufficient documentation

## 2016-06-10 DIAGNOSIS — R52 Pain, unspecified: Secondary | ICD-10-CM | POA: Diagnosis not present

## 2016-06-10 DIAGNOSIS — Z794 Long term (current) use of insulin: Secondary | ICD-10-CM | POA: Insufficient documentation

## 2016-06-10 DIAGNOSIS — K5730 Diverticulosis of large intestine without perforation or abscess without bleeding: Secondary | ICD-10-CM | POA: Diagnosis not present

## 2016-06-10 DIAGNOSIS — Z79899 Other long term (current) drug therapy: Secondary | ICD-10-CM | POA: Insufficient documentation

## 2016-06-10 DIAGNOSIS — N50811 Right testicular pain: Secondary | ICD-10-CM | POA: Insufficient documentation

## 2016-06-10 DIAGNOSIS — S3131XA Laceration without foreign body of scrotum and testes, initial encounter: Secondary | ICD-10-CM | POA: Diagnosis not present

## 2016-06-10 LAB — URINALYSIS COMPLETE WITH MICROSCOPIC (ARMC ONLY)
BILIRUBIN URINE: NEGATIVE
Bacteria, UA: NONE SEEN
Glucose, UA: 50 mg/dL — AB
Leukocytes, UA: NEGATIVE
NITRITE: NEGATIVE
PH: 5 (ref 5.0–8.0)
Specific Gravity, Urine: 1.015 (ref 1.005–1.030)

## 2016-06-10 LAB — COMPREHENSIVE METABOLIC PANEL
ALBUMIN: 3 g/dL — AB (ref 3.5–5.0)
ALT: 18 U/L (ref 17–63)
ANION GAP: 7 (ref 5–15)
AST: 26 U/L (ref 15–41)
Alkaline Phosphatase: 123 U/L (ref 38–126)
BUN: 22 mg/dL — ABNORMAL HIGH (ref 6–20)
CHLORIDE: 99 mmol/L — AB (ref 101–111)
CO2: 28 mmol/L (ref 22–32)
Calcium: 9 mg/dL (ref 8.9–10.3)
Creatinine, Ser: 1.43 mg/dL — ABNORMAL HIGH (ref 0.61–1.24)
GFR calc Af Amer: 55 mL/min — ABNORMAL LOW (ref >60)
GFR calc non Af Amer: 47 mL/min — ABNORMAL LOW (ref >60)
Glucose, Bld: 169 mg/dL — ABNORMAL HIGH (ref 65–99)
POTASSIUM: 3.8 mmol/L (ref 3.5–5.1)
SODIUM: 134 mmol/L — AB (ref 135–145)
Total Bilirubin: 0.7 mg/dL (ref 0.3–1.2)
Total Protein: 6.6 g/dL (ref 6.5–8.1)

## 2016-06-10 LAB — CBC WITH DIFFERENTIAL/PLATELET
BASOS PCT: 1 %
Basophils Absolute: 0.1 10*3/uL (ref 0–0.1)
EOS ABS: 0.3 10*3/uL (ref 0–0.7)
EOS PCT: 4 %
HCT: 39.2 % — ABNORMAL LOW (ref 40.0–52.0)
Hemoglobin: 13.4 g/dL (ref 13.0–18.0)
LYMPHS ABS: 3.3 10*3/uL (ref 1.0–3.6)
Lymphocytes Relative: 44 %
MCH: 28.6 pg (ref 26.0–34.0)
MCHC: 34.1 g/dL (ref 32.0–36.0)
MCV: 83.8 fL (ref 80.0–100.0)
Monocytes Absolute: 0.6 10*3/uL (ref 0.2–1.0)
Monocytes Relative: 9 %
Neutro Abs: 3.1 10*3/uL (ref 1.4–6.5)
Neutrophils Relative %: 42 %
PLATELETS: 147 10*3/uL — AB (ref 150–440)
RBC: 4.69 MIL/uL (ref 4.40–5.90)
RDW: 17.1 % — ABNORMAL HIGH (ref 11.5–14.5)
WBC: 7.4 10*3/uL (ref 3.8–10.6)

## 2016-06-10 MED ORDER — OXYCODONE-ACETAMINOPHEN 5-325 MG PO TABS
2.0000 | ORAL_TABLET | Freq: Once | ORAL | Status: AC
  Administered 2016-06-10: 2 via ORAL
  Filled 2016-06-10: qty 2

## 2016-06-10 MED ORDER — TRAMADOL HCL 50 MG PO TABS: 50.0000 mg | ORAL_TABLET | Freq: Four times a day (QID) | ORAL | 0 refills | Status: AC | PRN

## 2016-06-10 NOTE — ED Provider Notes (Signed)
[facility] Emergency Department Provider Note        Time seen: ----------------------------------------- 2:32 PM on 06/10/2016 -----------------------------------------    I have reviewed the triage vital signs and the nursing notes.   HISTORY  Chief Complaint Testicle Pain    HPI Aldean Jewettrthur Obie Jr is a 70 y.o. male who presents to ER being [facility] with [facility]rotal pain. Family states his been going on at least for a month. He was evaluated for this recently and found to have a UTI. He is currently on his last day of antibiotics. Family denies any fevers or other complaints. Patient complaining of right-sided testicular pain. He does not have a chronic indwelling Foley catheter.   Past Medical History:  Diagnosis Date  . Autoimmune hepatitis (HCC)   . C. difficile colitis   . Cirrhosis (HCC)   . Diabetes mellitus without complication (HCC)   . DVT (deep vein thrombosis) in pregnancy   . Hypertension   . Hypothyroidism   . Presence of permanent cardiac pacemaker   . Stroke [facility](HCC)     Patient Active Problem List   Diagnosis Date Noted  . Autoimmune hepatitis (HCC)   . C. difficile colitis 04/11/2015  . HCAP (healthcare-associated pneumonia) 04/11/2015  . Hypokalemia 04/11/2015  . DVT (deep vein thrombosis) in pregnancy   . Elevated LFTs   . Acute encephalopathy   . Erosive gastritis   . Duodenitis   . Elevated liver function tests 04/03/2015  . Diarrhea 04/03/2015  . Weakness 04/03/2015  . Diabetes mellitus type 2, uncontrolled (HCC) 04/03/2015  . Hypothyroidism 04/03/2015  . Essential hypertension 04/03/2015  . Gallstones 04/03/2015  . CKD (chronic kidney disease) stage 3, GFR 30-59 ml/min 04/03/2015    Past Surgical History:  Procedure Laterality Date  . ESOPHAGOGASTRODUODENOSCOPY N/A 04/07/2015   Procedure: ESOPHAGOGASTRODUODENOSCOPY (EGD);  Surgeon: [name] DareMalcolm T Stark, MD;  [facility];  Service: Endoscopy;   Laterality: N/A;  . INSERT / REPLACE / REMOVE PACEMAKER    . TONSILLECTOMY      Allergies Review of patient's allergies indicates no known allergies.  Social History Social History  Substance Use Topics  . Smoking status: Never Smoker  . Smokeless tobacco: Never Used  . Alcohol use No    Review of Systems Constitutional: Negative for fever. Cardiovascular: Negative for chest pain. Respiratory: Negative for shortness of breath. Gastrointestinal: Negative for abdominal pain, vomiting and diarrhea. Genitourinary: Negative for dysuria.Positive for testicular pain Musculoskeletal: Negative for back pain. Skin: Negative for rash. Neurological: Negative for headaches, focal weakness or numbness.  10-point ROS otherwise negative.  ____________________________________________   PHYSICAL EXAM:  VITAL SIGNS: ED Triage Vitals [06/10/16 1406]  Enc Vitals Group     BP 130/82     Pulse Rate 63     Resp 18     Temp 97.9 F (36.6 C)     Temp Source Oral     SpO2 98 %     Weight 240 lb (108.9 kg)     Height 6\' 2"  (1.88 m)     Head Circumference      Peak Flow      Pain Score      Pain Loc      Pain Edu?      Excl. in GC?     Constitutional: Alert, No acute distress Eyes: Conjunctivae are normal. PERRL. Normal extraocular movements. Cardiovascular: Normal rate, regular rhythm. No murmurs, rubs, or gallops. Respiratory: Normal respiratory effort without tachypnea nor  retractions. Breath sounds are clear and equal bilaterally. No wheezes/rales/rhonchi. Gastrointestinal: Soft and nontender. Normal bowel sounds Genitourinary: Uncircumcised, normal-appearing scrotum and testicles, right testicle is tender to touch, no inguinal hernias Musculoskeletal: Nontender with normal range of motion in all extremities. No lower extremity tenderness nor edema. Neurologic:  Normal speech and language. No gross focal neurologic deficits are appreciated.  Skin:  Skin is warm, dry and intact. No  rash noted. Psychiatric: Mood and affect are normal. Speech and behavior are normal.  ____________________________________________  ED COURSE:  Pertinent labs & imaging results that were available during my care of the patient were reviewed by me and considered in my medical decision making (see chart for details). Clinical Course  Patient presents in no acute distress, we will check basic labs, urinalysis and scrotal ultrasound  Procedures ____________________________________________   LABS (pertinent positives/negatives)  Labs Reviewed  CBC WITH DIFFERENTIAL/PLATELET - Abnormal; Notable for the following:       Result Value   HCT 39.2 (*)    RDW 17.1 (*)    Platelets 147 (*)    All other components within normal limits  COMPREHENSIVE METABOLIC PANEL - Abnormal; Notable for the following:    Sodium 134 (*)    Chloride 99 (*)    Glucose, Bld 169 (*)    BUN 22 (*)    Creatinine, Ser 1.43 (*)    Albumin 3.0 (*)    GFR calc non Af Amer 47 (*)    GFR calc Af Amer 55 (*)    All other components within normal limits  URINALYSIS COMPLETEWITH MICROSCOPIC ([facility] ONLY)    RADIOLOGY  Scrotal ultrasound Is pending at this time ____________________________________________  FINAL ASSESSMENT AND PLAN  Testicular pain  Plan: Patient with labs as dictated above. Patient is in no acute distress, US and UA are pending at this time. Patient care checked out to Dr. Lenard LancePaduchowski at this time.    Emily FilbertWilliams, Jonathan E, MD   Note: This dictation was prepared with Dragon dictation. Any transcriptional errors that result from this process are unintentional    Emily FilbertJonathan E Williams, MD 06/10/16 1531

## 2016-06-10 NOTE — ED Notes (Signed)
Wife has arrived. She states that pt has had scrotal and testicular pain x at least 2 weeks and that it has been treated with abx. States that it is still very swollen and painful to the touch. She states [facility] because he can't [facility]. He had C-diff over a year ago and spent so much time not walking that he is now unable to walk.

## 2016-06-10 NOTE — ED Triage Notes (Signed)
Pt via ems from [facility] with scrotal pain of unknown duration. Pt is poor historian, with long pauses and inability to articulate his problem. Paperwork sent with pt indicates that he is "still" having scrotal pain after finishing antibiotics and that wife wanted him sent to hospital. Pt alert & oriented to self and situation. 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

## 2016-06-10 NOTE — ED Provider Notes (Signed)
-----------------------------------------   5:48 PM on 06/10/2016 -----------------------------------------  Patient CT scan is negative. Ultrasound is normal. Labs are largely within normal limits. We'll have the patient follow up with his PCP as well as Vanna ScotlandAshley Brandon of urology. Patient agreeable plan.   Minna AntisKevin Leela Vanbrocklin, MD 06/10/16 [phone]

## 2016-06-10 NOTE — ED Notes (Signed)
Pt appeared to be in a significant pain while Dr Mayford KnifeWilliams examined his testicles.

## 2016-06-10 NOTE — ED Notes (Signed)
Performed bladder scan on pt.

## 2016-06-10 NOTE — ED Notes (Signed)
Spoke with Lawson FiscalLori, Nursing Supervisor at [facility] and report given at this time. Lawson FiscalLori states that pt will need to be transported by EMS back to their facility. MD aware.

## 2016-06-10 NOTE — ED Notes (Signed)
D&C pt's  IV

## 2016-06-11 ENCOUNTER — Ambulatory Visit (INDEPENDENT_AMBULATORY_CARE_PROVIDER_SITE_OTHER): Payer: Medicare Other | Admitting: Urology

## 2016-06-11 ENCOUNTER — Encounter: Payer: Self-pay | Admitting: Urology

## 2016-06-11 VITALS — BP 105/65 | HR 59 | Ht 76.0 in | Wt 239.4 lb

## 2016-06-11 DIAGNOSIS — N50819 Testicular pain, unspecified: Secondary | ICD-10-CM | POA: Diagnosis not present

## 2016-06-11 DIAGNOSIS — N390 Urinary tract infection, site not specified: Secondary | ICD-10-CM

## 2016-06-11 DIAGNOSIS — Z7401 Bed confinement status: Secondary | ICD-10-CM | POA: Diagnosis not present

## 2016-06-11 LAB — BLADDER SCAN AMB NON-IMAGING: Scan Result: 244

## 2016-06-11 NOTE — Progress Notes (Signed)
06/11/2016 2:27 PM   [name] [birth year] [id number]  Referring provider: No referring provider defined for this encounter.  Chief Complaint  Patient presents with  . Testicle Pain    new patient sent from ER  CT scan negative    HPI: Patient is a 70 year old male who is referred from [facility] ED for right scrotal pain.  Patient is a poor historian.  He presents today with a representative from [facility] and his wife's friend.  Neither of these individuals could answer my questions.    The wife's friend was present during one episode where his scrotum was being washed and the patient screamed out in pain.    She states the scrotum was swollen at the same time.  This is when he was seen in the ED.  CT and scrotal ultrasound were performed during this painful episode.  Exam was normal in the ED, but the right testicle was tender.    Per ED notes, patient has been recently diagnosis ed with an UTI.  Treatment with the antibiotic did not help with the scrotal pain.    I cannot obtain a detailed history at today's visit.    PVR today is 244 mL.     PMH: Past Medical History:  Diagnosis Date  . Autoimmune hepatitis (HCC)   . C. difficile colitis   . Cirrhosis (HCC)   . Diabetes mellitus without complication (HCC)   . DVT (deep vein thrombosis) in pregnancy   . Heart failure (HCC)   . Hypertension   . Hypothyroidism   . Presence of permanent cardiac pacemaker   . PTSD (post-traumatic stress disorder)   . Stroke [facility])     Surgical History: Past Surgical History:  Procedure Laterality Date  . ESOPHAGOGASTRODUODENOSCOPY N/A 04/07/2015   Procedure: ESOPHAGOGASTRODUODENOSCOPY (EGD);  Surgeon: [name] Dare, MD;  Location[facility];  Service: Endoscopy;  Laterality: N/A;  . INSERT / REPLACE / REMOVE PACEMAKER    . TONSILLECTOMY      Home Medications:    Medication List       Accurate as of 06/11/16  2:27 PM. Always use your most recent med list.          acetaminophen 325 MG tablet Commonly known as:  TYLENOL Take 650 mg by mouth every 6 (six) hours as needed.   acidophilus Caps capsule Take by mouth daily.   amLODipine 10 MG tablet Commonly known as:  NORVASC Take 1 tablet (10 mg total) by mouth daily.   benzonatate 100 MG capsule Commonly known as:  TESSALON Take by mouth 3 (three) times daily as needed for cough.   brimonidine 0.2 % ophthalmic solution Commonly known as:  ALPHAGAN Place 1 drop into the left eye 2 (two) times daily.   budesonide-formoterol 160-4.5 MCG/ACT inhaler Commonly known as:  SYMBICORT Inhale 2 puffs into the lungs 2 (two) times daily.   cholecalciferol 1000 units tablet Commonly known as:  VITAMIN D Take 2,000 Units by mouth daily.   ELIQUIS 2.5 MG Tabs tablet Generic drug:  apixaban Take 2.5 mg by mouth 2 (two) times daily.   hydroxypropyl methylcellulose / hypromellose 2.5 % ophthalmic solution Commonly known as:  ISOPTO TEARS / GONIOVISC Place 1 drop into both eyes 4 (four) times daily as needed for dry eyes (dry eyes).   IMODIUM A-D 2 MG tablet Generic drug:  loperamide Take 2 mg by mouth 3 (three) times daily as needed for diarrhea or loose stools.  insulin glargine 100 UNIT/ML injection Commonly known as:  LANTUS Inject 26 Units into the skin at bedtime.   insulin lispro 100 UNIT/ML injection Commonly known as:  HUMALOG Inject 6-8 Units into the skin 2 (two) times daily. 6 units before lunch 8 units before dinner   ketoconazole 2 % cream Commonly known as:  NIZORAL Apply 1 application topically daily.   latanoprost 0.005 % ophthalmic solution Commonly known as:  XALATAN Place 1 drop into both eyes at bedtime.   levothyroxine 200 MCG tablet Commonly known as:  SYNTHROID, LEVOTHROID Take 200 mcg by mouth daily before breakfast.   metoprolol succinate 50 MG 24 hr tablet Commonly known as:  TOPROL-XL Take 1 tablet (50 mg total) by mouth daily. Take with or immediately  following a meal.   polyethylene glycol packet Commonly known as:  MIRALAX / GLYCOLAX Take 17 g by mouth daily.   polyvinyl alcohol 1.4 % ophthalmic solution Commonly known as:  LIQUIFILM TEARS Place 1 drop into both eyes daily as needed for dry eyes.   ranitidine 150 MG tablet Commonly known as:  ZANTAC Take 150 mg by mouth daily.   sertraline 100 MG tablet Commonly known as:  ZOLOFT Take 200 mg by mouth daily.   tamsulosin 0.4 MG Caps capsule Commonly known as:  FLOMAX Take 1 capsule (0.4 mg total) by mouth daily.   traMADol 50 MG tablet Commonly known as:  ULTRAM Take 1 tablet (50 mg total) by mouth every 6 (six) hours as needed.       Allergies: No Known Allergies  Family History: Family History  Problem Relation Age of Onset  . Diabetes Mellitus II Sister   . CAD Sister   . Hypertension Sister     Social History:  reports that he has quit smoking. He has never used smokeless tobacco. He reports that he does not drink alcohol or use drugs.  ROS: UROLOGY Frequent Urination?: No Hard to postpone urination?: No Burning/pain with urination?: No Get up at night to urinate?: No Leakage of urine?: No Urine stream starts and stops?: Yes Trouble starting stream?: No Do you have to strain to urinate?: No Blood in urine?: Yes Urinary tract infection?: Yes Sexually transmitted disease?: No Injury to kidneys or bladder?: Yes Painful intercourse?: No Weak stream?: No Erection problems?: No Penile pain?: No  Gastrointestinal Nausea?: No Vomiting?: No Indigestion/heartburn?: No Diarrhea?: No Constipation?: No  Constitutional Fever: No Night sweats?: No Weight loss?: No Fatigue?: No  Skin Skin rash/lesions?: No Itching?: No  Eyes Blurred vision?: No Double vision?: No  Ears/Nose/Throat Sore throat?: No Sinus problems?: No  Hematologic/Lymphatic Swollen glands?: No Easy bruising?: No  Cardiovascular Leg swelling?: No Chest pain?:  No  Respiratory Cough?: No Shortness of breath?: No  Endocrine Excessive thirst?: No  Musculoskeletal Back pain?: No Joint pain?: No  Neurological Headaches?: No Dizziness?: No  Psychologic Depression?: No Anxiety?: No  Physical Exam: BP 105/65   Pulse (!) 59   Ht 6\' 4"  (1.93 m)   Wt 239 lb 6.4 oz (108.6 kg)   BMI 29.14 kg/m   Constitutional: Well nourished. Alert and oriented, No acute distress. HEENT: [place] AT, moist mucus membranes. Trachea midline, no masses. Cardiovascular: No clubbing, cyanosis, or edema. Respiratory: Normal respiratory effort, no increased work of breathing. GI: Abdomen is soft, non tender, non distended, no abdominal masses. Liver and spleen not palpable.  No hernias appreciated.  Stool sample for occult testing is not indicated.   GU: No CVA tenderness.  No bladder fullness or masses.  Patient with circumcised phallus.  Urethral meatus is patent.  No penile discharge. No penile lesions or rashes. Scrotum without lesions, cysts, rashes and/or edema.  Testicles are located scrotally bilaterally. No masses are appreciated in the testicles. Left and right epididymis are normal. Rectal: Could not be performed as patient is on a stretcher and cannot be turned due to body habitus.   Skin: No rashes, bruises or suspicious lesions. Lymph: No cervical or inguinal adenopathy. Neurologic: Grossly intact, no focal deficits, moving all 4 extremities. Psychiatric: Normal mood and affect.  Laboratory Data: Lab Results  Component Value Date   WBC 7.4 06/10/2016   HGB 13.4 06/10/2016   HCT 39.2 (L) 06/10/2016   MCV 83.8 06/10/2016   PLT 147 (L) 06/10/2016    Lab Results  Component Value Date   CREATININE 1.43 (H) 06/10/2016     Lab Results  Component Value Date   HGBA1C 7.5 (H) 04/12/2015    Lab Results  Component Value Date   TSH 1.565 04/12/2015       Component Value Date/Time   CHOL 92 04/12/2015 0719   HDL 13 (L) 04/12/2015 0719    CHOLHDL 7.1 04/12/2015 0719   VLDL 19 04/12/2015 0719   LDLCALC 60 04/12/2015 0719    Lab Results  Component Value Date   AST 26 06/10/2016   Lab Results  Component Value Date   ALT 18 06/10/2016     Pertinent Imaging: CLINICAL DATA:  Right testicular pain for 2 weeks.  EXAM: SCROTAL ULTRASOUND  DOPPLER ULTRASOUND OF THE TESTICLES  TECHNIQUE: Complete ultrasound examination of the testicles, epididymis, and other scrotal structures was performed. Color and spectral Doppler ultrasound were also utilized to evaluate blood flow to the testicles.  COMPARISON:  None.  FINDINGS: Right testicle  Measurements: 3.4 x 1.9 x 2.0 cm. No mass or microlithiasis visualized.  Left testicle  Measurements: 3.7 x 1.7 x 2.2 cm. No mass or microlithiasis visualized.  Right epididymis:  Normal in size and appearance.  Left epididymis:  Normal in size and appearance.  Hydrocele:  None visualized.  Varicocele:  None visualized.  Pulsed Doppler interrogation of both testes demonstrates normal low resistance arterial and venous waveforms bilaterally.  IMPRESSION: Negative. No evidence testicular mass, testicular torsion, or other sonographic abnormality.   Electronically Signed   By: Myles Rosenthal M.D.   On: 06/10/2016 15:55    CLINICAL DATA:  Urinary tract infection.  Right scrotal pain.  EXAM: CT ABDOMEN AND PELVIS WITHOUT CONTRAST  TECHNIQUE: Multidetector CT imaging of the abdomen and pelvis was performed following the standard protocol without IV contrast.  COMPARISON:  Scrotal ultrasound obtained earlier today.  FINDINGS: Lower chest: Mild bilateral gynecomastia. Extensive coronary artery calcifications. Cardiac pacemaker leads. Small amount of linear atelectasis or scarring at the left lung base.  Hepatobiliary: Nodular liver contours with a prominent caudate lobe. Multiple gallstones in the gallbladder. The individual stones  are small, measuring approximately 3 mm in maximum diameter. No gallbladder wall thickening or pericholecystic fluid.  Pancreas: Diffusely small.  Spleen: Within normal limits in size and appearance.  Adrenals/Urinary Tract: Small lower pole right renal cyst. No urinary tract calculi or hydronephrosis. Air in the urinary bladder, compatible with recent catheterization. Mild diffuse bladder wall thickening. Normal appearing adrenal glands.  Stomach/Bowel: Prominent stool throughout the colon. Minimal sigmoid colon diverticulosis. No gastric or small bowel abnormalities. Normal appearing appendix.  Vascular/Lymphatic: Atheromatous arterial calcifications, including the abdominal aorta and its branches.  No enlarged lymph nodes.  Reproductive: Mildly enlarged prostate gland.  Other: Moderately large ventral hernia to the left of midline at the level of the upper pelvis, containing multiple herniated small bowel loops, without bowel dilatation or wall thickening. No associated edema. There is a smaller ventral herniated to the left of midline slightly more superiorly, containing herniated fat.  Musculoskeletal: Mild lumbar and lower thoracic spine degenerative changes.  IMPRESSION: 1. No acute abnormality. 2. Extensive atheromatous coronary artery calcifications. 3. Aortic atherosclerosis. 4. Moderate diffuse pancreatic atrophy. 5. Mild diffuse bladder wall thickening. This could be due to bladder outlet obstruction by the mildly enlarged prostate gland or due to cystitis. 6. Minimal sigmoid colon diverticulosis. 7. Moderately large ventral hernia to the left of midline at the level of the upper pelvis, containing multiple herniated small bowel loops without obstruction. 8. Smaller ventral hernia containing herniated fat. 9. Prominent stool throughout the colon.   Electronically Signed   By: Beckie SaltsSteven  Reid M.D.   On: 06/10/2016 17:19   Assessment & Plan:     1. Right scrotal pain  - exam normal  - scrotal ultrasound normal  - CT scan did not identify an etiology for the scrotal pain  - want to rule out bladder spasms as a source of the scrotal pain, but the facility cannot perform bladder ultrasounds at the exact time patient complains of pain  - advised to seek treatment in the ED if becomes febrile, scrotal swelling/redness or pain/ vomiting are difficult control  2. Recurrent UTI's  - per family friend accompanying the patient  - review al of records note an UTI every few years  - RTC in 2 weeks for CATH UA      Return in about 2 weeks (around 06/25/2016) for CATH UA on nurses schedule.  These notes generated with voice recognition software. I apologize for typographical errors.  Michiel CowboySHANNON Burley Kopka, PA-C  [facility] [address], [address] [place], [place] [address] [phone]

## 2016-06-25 ENCOUNTER — Encounter: Payer: Self-pay | Admitting: Family Medicine

## 2016-06-25 ENCOUNTER — Ambulatory Visit (INDEPENDENT_AMBULATORY_CARE_PROVIDER_SITE_OTHER): Payer: Medicare Other | Admitting: Urology

## 2016-06-25 VITALS — BP 108/62 | HR 60

## 2016-06-25 DIAGNOSIS — N390 Urinary tract infection, site not specified: Secondary | ICD-10-CM

## 2016-06-25 DIAGNOSIS — Z7401 Bed confinement status: Secondary | ICD-10-CM | POA: Diagnosis not present

## 2016-06-25 DIAGNOSIS — R6889 Other general symptoms and signs: Secondary | ICD-10-CM | POA: Diagnosis not present

## 2016-06-25 LAB — URINALYSIS, COMPLETE
Bilirubin, UA: NEGATIVE
Glucose, UA: NEGATIVE
Ketones, UA: NEGATIVE
Leukocytes, UA: NEGATIVE
Nitrite, UA: NEGATIVE
Specific Gravity, UA: 1.025 (ref 1.005–1.030)
UUROB: 0.2 mg/dL (ref 0.2–1.0)
pH, UA: 6 (ref 5.0–7.5)

## 2016-06-25 LAB — MICROSCOPIC EXAMINATION
BACTERIA UA: NONE SEEN
Epithelial Cells (non renal): NONE SEEN /hpf (ref 0–10)

## 2016-06-25 NOTE — Progress Notes (Signed)
In and Out Catheterization  Patient is present today for a I & O catheterization due to recurrent UTI. Patient was cleaned and prepped in a sterile fashion with betadine.  A 14FR cath was inserted no complications were noted , 180ml of urine return was noted, urine was yellow in color. A clean urine sample was collected for a UA. Bladder was drained  And catheter was removed with out difficulty.    Preformed by: Teressa Lowerarrie Garrison, CMA

## 2016-06-28 ENCOUNTER — Telehealth: Payer: Self-pay

## 2016-06-28 NOTE — Telephone Encounter (Signed)
Lynden [facility]Cathy [facility] called wanting to know if pt still had a UTI and if so could he get abx before the long weekend. Made Cathy aware only a u/a was performed and per Dr. Ronne BinningMcKenzie a ucx was not ordered. Cathy voiced understanding.

## 2016-07-02 ENCOUNTER — Emergency Department
Admission: EM | Admit: 2016-07-02 | Discharge: 2016-07-03 | Disposition: A | Discharge status: Home / Self Care | Payer: Medicare Other | Attending: Student in an Organized Health Care Education/Training Program | Admitting: Student in an Organized Health Care Education/Training Program

## 2016-07-02 ENCOUNTER — Encounter: Payer: Self-pay | Admitting: Emergency Medicine

## 2016-07-02 ENCOUNTER — Emergency Department: Payer: Medicare Other

## 2016-07-02 DIAGNOSIS — Z950 Presence of cardiac pacemaker: Secondary | ICD-10-CM | POA: Insufficient documentation

## 2016-07-02 DIAGNOSIS — R400 Somnolence: Secondary | ICD-10-CM | POA: Diagnosis not present

## 2016-07-02 DIAGNOSIS — Z7401 Bed confinement status: Secondary | ICD-10-CM | POA: Diagnosis not present

## 2016-07-02 DIAGNOSIS — R51 Headache: Secondary | ICD-10-CM | POA: Diagnosis not present

## 2016-07-02 DIAGNOSIS — I509 Heart failure, unspecified: Secondary | ICD-10-CM | POA: Insufficient documentation

## 2016-07-02 DIAGNOSIS — E039 Hypothyroidism, unspecified: Secondary | ICD-10-CM | POA: Insufficient documentation

## 2016-07-02 DIAGNOSIS — Z87891 Personal history of nicotine dependence: Secondary | ICD-10-CM | POA: Insufficient documentation

## 2016-07-02 DIAGNOSIS — N390 Urinary tract infection, site not specified: Secondary | ICD-10-CM | POA: Diagnosis not present

## 2016-07-02 DIAGNOSIS — Z794 Long term (current) use of insulin: Secondary | ICD-10-CM | POA: Diagnosis not present

## 2016-07-02 DIAGNOSIS — R4182 Altered mental status, unspecified: Secondary | ICD-10-CM | POA: Diagnosis present

## 2016-07-02 DIAGNOSIS — I130 Hypertensive heart and chronic kidney disease with heart failure and stage 1 through stage 4 chronic kidney disease, or unspecified chronic kidney disease: Secondary | ICD-10-CM | POA: Diagnosis not present

## 2016-07-02 DIAGNOSIS — N183 Chronic kidney disease, stage 3 (moderate): Secondary | ICD-10-CM | POA: Insufficient documentation

## 2016-07-02 LAB — COMPREHENSIVE METABOLIC PANEL
ALBUMIN: 2.4 g/dL — AB (ref 3.5–5.0)
ALK PHOS: 111 U/L (ref 38–126)
ALT: 19 U/L (ref 17–63)
ANION GAP: 5 (ref 5–15)
AST: 26 U/L (ref 15–41)
BILIRUBIN TOTAL: 0.4 mg/dL (ref 0.3–1.2)
BUN: 19 mg/dL (ref 6–20)
CALCIUM: 8.8 mg/dL — AB (ref 8.9–10.3)
CO2: 29 mmol/L (ref 22–32)
Chloride: 100 mmol/L — ABNORMAL LOW (ref 101–111)
Creatinine, Ser: 1.34 mg/dL — ABNORMAL HIGH (ref 0.61–1.24)
GFR calc Af Amer: 59 mL/min — ABNORMAL LOW (ref >60)
GFR calc non Af Amer: 51 mL/min — ABNORMAL LOW (ref >60)
GLUCOSE: 172 mg/dL — AB (ref 65–99)
Potassium: 3.8 mmol/L (ref 3.5–5.1)
SODIUM: 134 mmol/L — AB (ref 135–145)
TOTAL PROTEIN: 6.7 g/dL (ref 6.5–8.1)

## 2016-07-02 LAB — URINE DRUG SCREEN, QUALITATIVE (ARMC ONLY)
Amphetamines, Ur Screen: NOT DETECTED
BENZODIAZEPINE, UR SCRN: NOT DETECTED
Barbiturates, Ur Screen: NOT DETECTED
CANNABINOID 50 NG, UR ~~LOC~~: NOT DETECTED
Cocaine Metabolite,Ur ~~LOC~~: NOT DETECTED
MDMA (ECSTASY) UR SCREEN: NOT DETECTED
Methadone Scn, Ur: NOT DETECTED
Opiate, Ur Screen: NOT DETECTED
PHENCYCLIDINE (PCP) UR S: NOT DETECTED
TRICYCLIC, UR SCREEN: NOT DETECTED

## 2016-07-02 LAB — CBC WITH DIFFERENTIAL/PLATELET
BASOS ABS: 0.1 10*3/uL (ref 0–0.1)
BASOS PCT: 1 %
EOS ABS: 0.4 10*3/uL (ref 0–0.7)
Eosinophils Relative: 5 %
HEMATOCRIT: 37.4 % — AB (ref 40.0–52.0)
HEMOGLOBIN: 12.5 g/dL — AB (ref 13.0–18.0)
Lymphocytes Relative: 36 %
Lymphs Abs: 3.3 10*3/uL (ref 1.0–3.6)
MCH: 27.6 pg (ref 26.0–34.0)
MCHC: 33.3 g/dL (ref 32.0–36.0)
MCV: 83.1 fL (ref 80.0–100.0)
Monocytes Absolute: 0.9 10*3/uL (ref 0.2–1.0)
Monocytes Relative: 11 %
NEUTROS ABS: 4.3 10*3/uL (ref 1.4–6.5)
NEUTROS PCT: 47 %
Platelets: 215 10*3/uL (ref 150–440)
RBC: 4.51 MIL/uL (ref 4.40–5.90)
RDW: 16.2 % — ABNORMAL HIGH (ref 11.5–14.5)
WBC: 9 10*3/uL (ref 3.8–10.6)

## 2016-07-02 LAB — URINALYSIS COMPLETE WITH MICROSCOPIC (ARMC ONLY)
Bilirubin Urine: NEGATIVE
GLUCOSE, UA: 50 mg/dL — AB
Ketones, ur: NEGATIVE mg/dL
LEUKOCYTES UA: NEGATIVE
NITRITE: POSITIVE — AB
SPECIFIC GRAVITY, URINE: 1.012 (ref 1.005–1.030)
SQUAMOUS EPITHELIAL / LPF: NONE SEEN
pH: 6 (ref 5.0–8.0)

## 2016-07-02 LAB — TROPONIN I
TROPONIN I: 0.04 ng/mL — AB (ref <0.03)
TROPONIN I: 0.04 ng/mL — AB (ref <0.03)

## 2016-07-02 LAB — AMMONIA: Ammonia: <9 umol/L — ABNORMAL LOW (ref 9–35)

## 2016-07-02 MED ORDER — CEPHALEXIN 500 MG PO CAPS
500.0000 mg | ORAL_CAPSULE | Freq: Once | ORAL | Status: AC
  Administered 2016-07-02: 500 mg via ORAL
  Filled 2016-07-02: qty 1

## 2016-07-02 MED ORDER — CEPHALEXIN 500 MG PO CAPS: 500.0000 mg | ORAL_CAPSULE | Freq: Three times a day (TID) | ORAL | 0 refills | Status: DC

## 2016-07-02 NOTE — ED Provider Notes (Signed)
[facility] Emergency Department Provider Note    First MD Initiated Contact with Patient 07/02/16 1909     (approximate)  I have reviewed the triage vital signs and the nursing notes.   HISTORY  Chief Complaint Altered Mental Status    HPI [name] is a 70 y.o. male with history of cirrhosis, C. difficile colitis as well as congestive heart failure and CVA presenting to the ER with report of altered mental status and headache. Patient is acutely encephalopathic and of limited assistance with history due to acute encephalopathy.   Past Medical History:  Diagnosis Date  . Autoimmune hepatitis (HCC)   . C. difficile colitis   . Cirrhosis (HCC)   . Diabetes mellitus without complication (HCC)   . DVT (deep vein thrombosis) in pregnancy   . Heart failure (HCC)   . Hypertension   . Hypothyroidism   . Presence of permanent cardiac pacemaker   . PTSD (post-traumatic stress disorder)   . Stroke [facility])     Patient Active Problem List   Diagnosis Date Noted  . Autoimmune hepatitis (HCC)   . C. difficile colitis 04/11/2015  . HCAP (healthcare-associated pneumonia) 04/11/2015  . Hypokalemia 04/11/2015  . DVT (deep vein thrombosis) in pregnancy   . Elevated LFTs   . Acute encephalopathy   . Erosive gastritis   . Duodenitis   . Elevated liver function tests 04/03/2015  . Diarrhea 04/03/2015  . Weakness 04/03/2015  . Diabetes mellitus type 2, uncontrolled (HCC) 04/03/2015  . Hypothyroidism 04/03/2015  . Essential hypertension 04/03/2015  . Gallstones 04/03/2015  . CKD (chronic kidney disease) stage 3, GFR 30-59 ml/min 04/03/2015    Past Surgical History:  Procedure Laterality Date  . ESOPHAGOGASTRODUODENOSCOPY N/A 04/07/2015   Procedure: ESOPHAGOGASTRODUODENOSCOPY (EGD);  Surgeon: [name] Dare, MD;  Location: [facility];  Service: Endoscopy;  Laterality: N/A;  . INSERT / REPLACE / REMOVE PACEMAKER    . TONSILLECTOMY      Prior to  Admission medications   Medication Sig Start Date End Date Taking? Authorizing Provider  acetaminophen (TYLENOL) 325 MG tablet Take 650 mg by mouth every 6 (six) hours as needed.    Historical Provider, MD  acidophilus (RISAQUAD) CAPS capsule Take by mouth daily.    Historical Provider, MD  amLODipine (NORVASC) 10 MG tablet Take 1 tablet (10 mg total) by mouth daily. 04/08/15   Leana Roe Elgergawy, MD  apixaban (ELIQUIS) 2.5 MG TABS tablet Take 2.5 mg by mouth 2 (two) times daily.    Historical Provider, MD  benzonatate (TESSALON) 100 MG capsule Take by mouth 3 (three) times daily as needed for cough.    Historical Provider, MD  brimonidine (ALPHAGAN) 0.2 % ophthalmic solution Place 1 drop into the left eye 2 (two) times daily.    Historical Provider, MD  budesonide-formoterol (SYMBICORT) 160-4.5 MCG/ACT inhaler Inhale 2 puffs into the lungs 2 (two) times daily.    Historical Provider, MD  cholecalciferol (VITAMIN D) 1000 UNITS tablet Take 2,000 Units by mouth daily.    Historical Provider, MD  hydroxypropyl methylcellulose / hypromellose (ISOPTO TEARS / GONIOVISC) 2.5 % ophthalmic solution Place 1 drop into both eyes 4 (four) times daily as needed for dry eyes (dry eyes).    Historical Provider, MD  insulin glargine (LANTUS) 100 UNIT/ML injection Inject 26 Units into the skin at bedtime.  05/29/15   Monina C Medina-Vargas, NP  insulin lispro (HUMALOG) 100 UNIT/ML injection Inject 6-8 Units into the skin 2 (two)  times daily. 6 units before lunch 8 units before dinner    Historical Provider, MD  ketoconazole (NIZORAL) 2 % cream Apply 1 application topically daily.    Historical Provider, MD  latanoprost (XALATAN) 0.005 % ophthalmic solution Place 1 drop into both eyes at bedtime.     Historical Provider, MD  levothyroxine (SYNTHROID, LEVOTHROID) 200 MCG tablet Take 200 mcg by mouth daily before breakfast.    Historical Provider, MD  loperamide (IMODIUM A-D) 2 MG tablet Take 2 mg by mouth 3 (three) times  daily as needed for diarrhea or loose stools.    Historical Provider, MD  metoprolol succinate (TOPROL-XL) 50 MG 24 hr tablet Take 1 tablet (50 mg total) by mouth daily. Take with or immediately following a meal. 04/15/15   Rodolph Bonganiel V Thompson, MD  polyethylene glycol [facility](MIRALAX / Ethelene HalGLYCOLAX) packet Take 17 g by mouth daily.    Historical Provider, MD  polyvinyl alcohol (LIQUIFILM TEARS) 1.4 % ophthalmic solution Place 1 drop into both eyes daily as needed for dry eyes. 04/08/15   Leana Roeawood S Elgergawy, MD  ranitidine (ZANTAC) 150 MG tablet Take 150 mg by mouth daily.    Historical Provider, MD  sertraline (ZOLOFT) 100 MG tablet Take 200 mg by mouth daily.    Historical Provider, MD  tamsulosin (FLOMAX) 0.4 MG CAPS capsule Take 1 capsule (0.4 mg total) by mouth daily. 04/08/15   Leana Roeawood S Elgergawy, MD  traMADol (ULTRAM) 50 MG tablet Take 1 tablet (50 mg total) by mouth every 6 (six) hours as needed. 06/10/16 06/10/17  Minna AntisKevin Paduchowski, MD    Allergies Review of patient's allergies indicates no known allergies.  Family History  Problem Relation Age of Onset  . Diabetes Mellitus II Sister   . CAD Sister   . Hypertension Sister     Social History Social History  Substance Use Topics  . Smoking status: Former Games developermoker  . Smokeless tobacco: Never Used     Comment: quit years ago  . Alcohol use No    Review of Systems Patient denies headaches, rhinorrhea, blurry vision, numbness, shortness of breath, chest pain, edema, cough, abdominal pain, nausea, vomiting, diarrhea, dysuria, fevers, rashes or hallucinations unless otherwise stated above in HPI. ____________________________________________   PHYSICAL EXAM:  VITAL SIGNS: Vitals:   07/02/16 1938  BP: 132/72  Resp: 18  Temp: 98.1 F (36.7 C)    Constitutional: Alert Chronically ill-appearing  Eyes: Conjunctivae are normal. PERRL. EOMI. Head: Atraumatic. Nose: No congestion/rhinnorhea. Mouth/Throat: Mucous membranes are moist.  Oropharynx  non-erythematous. Neck: No stridor. Painless ROM. No cervical spine tenderness to palpation Hematological/Lymphatic/Immunilogical: No cervical lymphadenopathy. Cardiovascular: Normal rate, regular rhythm. Grossly normal heart sounds.  Good peripheral circulation. Respiratory: Normal respiratory effort.  No retractions. Lungs CTAB. Gastrointestinal: Soft and nontender. Periumbilical reducible hernia No distention. No abdominal bruits. No CVA tenderness. Musculoskeletal: No lower extremity tenderness nor edema.  Chronic he was also to bilateral heels. No evidence of cellulitis. No joint effusions. Neurologic: Encephalopathic. Complete neuro exam limited due to encephalopathy Skin:  Skin is warm, dry and intact. No rash noted. Psychiatric: Mood and affect are normal. Speech and behavior are normal.  ____________________________________________   LABS (all labs ordered are listed, but only abnormal results are displayed)  Results for orders placed or performed during the hospital encounter of 07/02/16 (from the past 24 hour(s))  CBC with Differential/Platelet     Status: Abnormal   Collection Time: 07/02/16  7:32 PM  Result Value Ref Range   WBC 9.0 3.8 -  10.6 K/uL   RBC 4.51 4.40 - 5.90 MIL/uL   Hemoglobin 12.5 (L) 13.0 - 18.0 g/dL   HCT 16.1 (L) 09.6 - 04.5 %   MCV 83.1 80.0 - 100.0 fL   MCH 27.6 26.0 - 34.0 pg   MCHC 33.3 32.0 - 36.0 g/dL   RDW 40.9 (H) 81.1 - 91.4 %   Platelets 215 150 - 440 K/uL   Neutrophils Relative % 47 %   Neutro Abs 4.3 1.4 - 6.5 K/uL   Lymphocytes Relative 36 %   Lymphs Abs 3.3 1.0 - 3.6 K/uL   Monocytes Relative 11 %   Monocytes Absolute 0.9 0.2 - 1.0 K/uL   Eosinophils Relative 5 %   Eosinophils Absolute 0.4 0 - 0.7 K/uL   Basophils Relative 1 %   Basophils Absolute 0.1 0 - 0.1 K/uL  Comprehensive metabolic panel     Status: Abnormal   Collection Time: 07/02/16  7:32 PM  Result Value Ref Range   Sodium 134 (L) 135 - 145 mmol/L   Potassium 3.8 3.5 -  5.1 mmol/L   Chloride 100 (L) 101 - 111 mmol/L   CO2 29 22 - 32 mmol/L   Glucose, Bld 172 (H) 65 - 99 mg/dL   BUN 19 6 - 20 mg/dL   Creatinine, Ser 7.82 (H) 0.61 - 1.24 mg/dL   Calcium 8.8 (L) 8.9 - 10.3 mg/dL   Total Protein 6.7 6.5 - 8.1 g/dL   Albumin 2.4 (L) 3.5 - 5.0 g/dL   AST 26 15 - 41 U/L   ALT 19 17 - 63 U/L   Alkaline Phosphatase 111 38 - 126 U/L   Total Bilirubin 0.4 0.3 - 1.2 mg/dL   GFR calc non Af Amer 51 (L) >60 mL/min   GFR calc Af Amer 59 (L) >60 mL/min   Anion gap 5 5 - 15   ____________________________________________  EKG My review and personal interpretation at Time: 19:24   Indication: ams  Rate: 72  Rhythm: a-v paced Axis: normal Other: no sgarbossa criteria ____________________________________________  RADIOLOGY  CT head with stable chronic white matter disease.   CXR with stable cardiomegaly ____________________________________________   PROCEDURES  Procedure(s) performed: none    Critical Care performed: no ____________________________________________   INITIAL IMPRESSION / ASSESSMENT AND PLAN / ED COURSE  Pertinent labs & imaging results that were available during my care of the patient were reviewed by me and considered in my medical decision making (see chart for details).  DDX: Dehydration, sepsis, pna, uti, hypoglycemia, cva, drug effect, withdrawal, encephalitis   Chino Sardo is a 70 y.o. who presents to the ED with Report of brief episode of drowsiness and difficulty awakening. Patient with chronic dementia and encephalopathy. Difficulty to determine acute versus chronic deficits.  Patient is able to communicate that he has no complaints at this time. Based on his chronic comorbidities and nursing home status I have concern for underlying electrolyte abnormality versus infection versus CVA.  There is no history reported to suggest seizure-like activity.  The patient will be placed on continuous pulse oximetry and telemetry for  monitoring.  Laboratory evaluation will be sent to evaluate for the above complaints.      .  Clinical Course  Comment By Time  Patient reassessed and wife and family now at bedside. Family states the patient appears at baseline. There report was that he was very sleepy and would not awaken for them he is currently denying any complaints or discomfort in the  family right now. He remains hemodynamically stable on the monitor.  No new focal neurodeficits. CT imaging is without evidence of acute abnormality. There is no report of any seizure-like activity. No recent diarrhea. No significant weight gain or hypoxia reported. Willy Eddy, MD 09/05 2044  Had a extensive discussion with family regarding lab results including mildly elevated troponin. Patient without any evidence of acute ischemia on his EKG but based on his history of heart failure and drowsy episode cannot completely exclude cardiac etiology. Pacemaker appears to be functioning well. After discussion with family will order a repeat troponin to further risk stratify. UA is currently pending. Patient remains asymptomatic without any complaints at this time. Patient will be signed out to Dr. Derrill Kay pending results of urinalysis as well as troponin.   Willy Eddy, MD 09/05 2120     ____________________________________________   FINAL CLINICAL IMPRESSION(S) / ED DIAGNOSES  Final diagnoses:  Intermittent drowsiness      NEW MEDICATIONS STARTED DURING THIS VISIT:  New Prescriptions   No medications on file     Note:  This document was prepared using Dragon voice recognition software and may include unintentional dictation errors.    Willy Eddy, MD 07/02/16 2135

## 2016-07-02 NOTE — ED Provider Notes (Signed)
Second troponin was negative. Patient will be treated for UTI. Patient will be discharged back to nursing facility.   Phineas SemenGraydon Kippy Gohman, MD 07/02/16 [phone]

## 2016-07-02 NOTE — ED Triage Notes (Signed)
[facility] for complaints of Ha, weakness and AMS.

## 2016-07-02 NOTE — ED Notes (Signed)
Lab called with troponin of 0.04   md aware.   

## 2016-07-02 NOTE — Discharge Instructions (Signed)
Please seek medical attention for any high fevers, chest pain, shortness of breath, change in behavior, persistent vomiting, bloody stool or any other new or concerning symptoms.  

## 2016-07-03 NOTE — ED Notes (Signed)
D/c inst to ems.

## 2016-12-25 ENCOUNTER — Emergency Department (HOSPITAL_COMMUNITY): Payer: Medicare Other

## 2016-12-25 ENCOUNTER — Inpatient Hospital Stay (HOSPITAL_COMMUNITY)
Admission: EM | Admit: 2016-12-25 | Discharge: 2016-12-31 | DRG: 065 | Disposition: A | Discharge status: Hospice | Payer: Medicare Other | Attending: Internal Medicine | Admitting: Internal Medicine

## 2016-12-25 ENCOUNTER — Encounter (HOSPITAL_COMMUNITY): Payer: Self-pay | Admitting: Emergency Medicine

## 2016-12-25 DIAGNOSIS — Z7901 Long term (current) use of anticoagulants: Secondary | ICD-10-CM

## 2016-12-25 DIAGNOSIS — E876 Hypokalemia: Secondary | ICD-10-CM | POA: Diagnosis not present

## 2016-12-25 DIAGNOSIS — E039 Hypothyroidism, unspecified: Secondary | ICD-10-CM | POA: Diagnosis present

## 2016-12-25 DIAGNOSIS — I129 Hypertensive chronic kidney disease with stage 1 through stage 4 chronic kidney disease, or unspecified chronic kidney disease: Secondary | ICD-10-CM | POA: Diagnosis present

## 2016-12-25 DIAGNOSIS — K754 Autoimmune hepatitis: Secondary | ICD-10-CM | POA: Diagnosis present

## 2016-12-25 DIAGNOSIS — E785 Hyperlipidemia, unspecified: Secondary | ICD-10-CM | POA: Diagnosis not present

## 2016-12-25 DIAGNOSIS — R1310 Dysphagia, unspecified: Secondary | ICD-10-CM

## 2016-12-25 DIAGNOSIS — Z833 Family history of diabetes mellitus: Secondary | ICD-10-CM

## 2016-12-25 DIAGNOSIS — R4781 Slurred speech: Secondary | ICD-10-CM | POA: Diagnosis not present

## 2016-12-25 DIAGNOSIS — E119 Type 2 diabetes mellitus without complications: Secondary | ICD-10-CM | POA: Diagnosis not present

## 2016-12-25 DIAGNOSIS — R4182 Altered mental status, unspecified: Secondary | ICD-10-CM | POA: Diagnosis present

## 2016-12-25 DIAGNOSIS — Z950 Presence of cardiac pacemaker: Secondary | ICD-10-CM

## 2016-12-25 DIAGNOSIS — R531 Weakness: Secondary | ICD-10-CM | POA: Diagnosis not present

## 2016-12-25 DIAGNOSIS — I679 Cerebrovascular disease, unspecified: Secondary | ICD-10-CM | POA: Diagnosis not present

## 2016-12-25 DIAGNOSIS — I6603 Occlusion and stenosis of bilateral middle cerebral arteries: Secondary | ICD-10-CM | POA: Diagnosis not present

## 2016-12-25 DIAGNOSIS — I10 Essential (primary) hypertension: Secondary | ICD-10-CM | POA: Diagnosis not present

## 2016-12-25 DIAGNOSIS — Z7401 Bed confinement status: Secondary | ICD-10-CM | POA: Diagnosis not present

## 2016-12-25 DIAGNOSIS — Z8619 Personal history of other infectious and parasitic diseases: Secondary | ICD-10-CM

## 2016-12-25 DIAGNOSIS — G8194 Hemiplegia, unspecified affecting left nondominant side: Secondary | ICD-10-CM | POA: Diagnosis present

## 2016-12-25 DIAGNOSIS — Z7951 Long term (current) use of inhaled steroids: Secondary | ICD-10-CM

## 2016-12-25 DIAGNOSIS — Z66 Do not resuscitate: Secondary | ICD-10-CM | POA: Diagnosis not present

## 2016-12-25 DIAGNOSIS — Z87891 Personal history of nicotine dependence: Secondary | ICD-10-CM | POA: Diagnosis not present

## 2016-12-25 DIAGNOSIS — N183 Chronic kidney disease, stage 3 unspecified: Secondary | ICD-10-CM | POA: Diagnosis present

## 2016-12-25 DIAGNOSIS — Z8249 Family history of ischemic heart disease and other diseases of the circulatory system: Secondary | ICD-10-CM

## 2016-12-25 DIAGNOSIS — I63411 Cerebral infarction due to embolism of right middle cerebral artery: Secondary | ICD-10-CM | POA: Diagnosis not present

## 2016-12-25 DIAGNOSIS — F4310 Post-traumatic stress disorder, unspecified: Secondary | ICD-10-CM | POA: Diagnosis present

## 2016-12-25 DIAGNOSIS — R414 Neurologic neglect syndrome: Secondary | ICD-10-CM | POA: Diagnosis present

## 2016-12-25 DIAGNOSIS — I69322 Dysarthria following cerebral infarction: Secondary | ICD-10-CM

## 2016-12-25 DIAGNOSIS — E1165 Type 2 diabetes mellitus with hyperglycemia: Secondary | ICD-10-CM | POA: Diagnosis not present

## 2016-12-25 DIAGNOSIS — J449 Chronic obstructive pulmonary disease, unspecified: Secondary | ICD-10-CM | POA: Diagnosis not present

## 2016-12-25 DIAGNOSIS — Z794 Long term (current) use of insulin: Secondary | ICD-10-CM | POA: Diagnosis not present

## 2016-12-25 DIAGNOSIS — I69398 Other sequelae of cerebral infarction: Secondary | ICD-10-CM

## 2016-12-25 DIAGNOSIS — Z823 Family history of stroke: Secondary | ICD-10-CM

## 2016-12-25 DIAGNOSIS — R29818 Other symptoms and signs involving the nervous system: Secondary | ICD-10-CM | POA: Diagnosis not present

## 2016-12-25 DIAGNOSIS — R1312 Dysphagia, oropharyngeal phase: Secondary | ICD-10-CM

## 2016-12-25 DIAGNOSIS — Z6828 Body mass index (BMI) 28.0-28.9, adult: Secondary | ICD-10-CM

## 2016-12-25 DIAGNOSIS — I639 Cerebral infarction, unspecified: Secondary | ICD-10-CM | POA: Diagnosis not present

## 2016-12-25 DIAGNOSIS — R29810 Facial weakness: Secondary | ICD-10-CM | POA: Diagnosis present

## 2016-12-25 DIAGNOSIS — Z515 Encounter for palliative care: Secondary | ICD-10-CM | POA: Diagnosis not present

## 2016-12-25 DIAGNOSIS — E1122 Type 2 diabetes mellitus with diabetic chronic kidney disease: Secondary | ICD-10-CM | POA: Diagnosis present

## 2016-12-25 DIAGNOSIS — I517 Cardiomegaly: Secondary | ICD-10-CM | POA: Diagnosis not present

## 2016-12-25 DIAGNOSIS — Z8673 Personal history of transient ischemic attack (TIA), and cerebral infarction without residual deficits: Secondary | ICD-10-CM | POA: Diagnosis not present

## 2016-12-25 DIAGNOSIS — I5189 Other ill-defined heart diseases: Secondary | ICD-10-CM | POA: Diagnosis present

## 2016-12-25 DIAGNOSIS — Z7189 Other specified counseling: Secondary | ICD-10-CM

## 2016-12-25 DIAGNOSIS — I5030 Unspecified diastolic (congestive) heart failure: Secondary | ICD-10-CM | POA: Diagnosis not present

## 2016-12-25 DIAGNOSIS — I63311 Cerebral infarction due to thrombosis of right middle cerebral artery: Secondary | ICD-10-CM | POA: Diagnosis not present

## 2016-12-25 DIAGNOSIS — Z86718 Personal history of other venous thrombosis and embolism: Secondary | ICD-10-CM | POA: Diagnosis not present

## 2016-12-25 DIAGNOSIS — IMO0002 Reserved for concepts with insufficient information to code with codable children: Secondary | ICD-10-CM | POA: Diagnosis present

## 2016-12-25 DIAGNOSIS — E440 Moderate protein-calorie malnutrition: Secondary | ICD-10-CM | POA: Diagnosis not present

## 2016-12-25 DIAGNOSIS — K7460 Unspecified cirrhosis of liver: Secondary | ICD-10-CM | POA: Diagnosis present

## 2016-12-25 DIAGNOSIS — E669 Obesity, unspecified: Secondary | ICD-10-CM | POA: Diagnosis present

## 2016-12-25 DIAGNOSIS — I519 Heart disease, unspecified: Secondary | ICD-10-CM | POA: Diagnosis not present

## 2016-12-25 DIAGNOSIS — I6623 Occlusion and stenosis of bilateral posterior cerebral arteries: Secondary | ICD-10-CM | POA: Diagnosis not present

## 2016-12-25 DIAGNOSIS — R29716 NIHSS score 16: Secondary | ICD-10-CM | POA: Diagnosis present

## 2016-12-25 DIAGNOSIS — I672 Cerebral atherosclerosis: Secondary | ICD-10-CM | POA: Diagnosis present

## 2016-12-25 DIAGNOSIS — Z8719 Personal history of other diseases of the digestive system: Secondary | ICD-10-CM | POA: Diagnosis not present

## 2016-12-25 DIAGNOSIS — E7800 Pure hypercholesterolemia, unspecified: Secondary | ICD-10-CM | POA: Diagnosis present

## 2016-12-25 HISTORY — DX: Acute embolism and thrombosis of unspecified deep veins of unspecified lower extremity: I82.409

## 2016-12-25 HISTORY — DX: Personal history of other venous thrombosis and embolism: Z86.718

## 2016-12-25 LAB — COMPREHENSIVE METABOLIC PANEL
ALBUMIN: 2.8 g/dL — AB (ref 3.5–5.0)
ALK PHOS: 100 U/L (ref 38–126)
ALT: 38 U/L (ref 17–63)
AST: 39 U/L (ref 15–41)
Anion gap: 7 (ref 5–15)
BILIRUBIN TOTAL: 0.5 mg/dL (ref 0.3–1.2)
BUN: 16 mg/dL (ref 6–20)
CALCIUM: 9.5 mg/dL (ref 8.9–10.3)
CO2: 28 mmol/L (ref 22–32)
Chloride: 104 mmol/L (ref 101–111)
Creatinine, Ser: 1.29 mg/dL — ABNORMAL HIGH (ref 0.61–1.24)
GFR calc Af Amer: >60 mL/min (ref >60)
GFR calc non Af Amer: 54 mL/min — ABNORMAL LOW (ref >60)
GLUCOSE: 92 mg/dL (ref 65–99)
Potassium: 3.5 mmol/L (ref 3.5–5.1)
SODIUM: 139 mmol/L (ref 135–145)
TOTAL PROTEIN: 6.3 g/dL — AB (ref 6.5–8.1)

## 2016-12-25 LAB — PROTIME-INR
INR: 1.17
Prothrombin Time: 15 seconds (ref 11.4–15.2)

## 2016-12-25 LAB — DIFFERENTIAL
BASOS ABS: 0.1 10*3/uL (ref 0.0–0.1)
Basophils Relative: 1 %
EOS PCT: 5 %
Eosinophils Absolute: 0.4 10*3/uL (ref 0.0–0.7)
LYMPHS ABS: 3.5 10*3/uL (ref 0.7–4.0)
LYMPHS PCT: 46 %
Monocytes Absolute: 0.8 10*3/uL (ref 0.1–1.0)
Monocytes Relative: 11 %
NEUTROS ABS: 2.8 10*3/uL (ref 1.7–7.7)
Neutrophils Relative %: 37 %

## 2016-12-25 LAB — CBC
HCT: 34.8 % — ABNORMAL LOW (ref 39.0–52.0)
HEMOGLOBIN: 11.6 g/dL — AB (ref 13.0–17.0)
MCH: 27.3 pg (ref 26.0–34.0)
MCHC: 33.3 g/dL (ref 30.0–36.0)
MCV: 81.9 fL (ref 78.0–100.0)
Platelets: 148 10*3/uL — ABNORMAL LOW (ref 150–400)
RBC: 4.25 MIL/uL (ref 4.22–5.81)
RDW: 15.4 % (ref 11.5–15.5)
WBC: 7.5 10*3/uL (ref 4.0–10.5)

## 2016-12-25 LAB — GLUCOSE, CAPILLARY
GLUCOSE-CAPILLARY: 74 mg/dL (ref 65–99)
GLUCOSE-CAPILLARY: 113 mg/dL — AB (ref 65–99)
Glucose-Capillary: 77 mg/dL (ref 65–99)

## 2016-12-25 LAB — I-STAT ARTERIAL BLOOD GAS, ED
ACID-BASE EXCESS: 1 mmol/L (ref 0.0–2.0)
BICARBONATE: 24.8 mmol/L (ref 20.0–28.0)
O2 Saturation: 97 %
PH ART: 7.443 (ref 7.350–7.450)
PO2 ART: 84 mmHg (ref 83.0–108.0)
TCO2: 26 mmol/L (ref 0–100)
pCO2 arterial: 36.3 mmHg (ref 32.0–48.0)

## 2016-12-25 LAB — I-STAT CG4 LACTIC ACID, ED: Lactic Acid, Venous: 0.8 mmol/L (ref 0.5–1.9)

## 2016-12-25 LAB — I-STAT TROPONIN, ED: Troponin i, poc: 0.05 ng/mL (ref 0.00–0.08)

## 2016-12-25 LAB — APTT: aPTT: 37 seconds — ABNORMAL HIGH (ref 24–36)

## 2016-12-25 LAB — I-STAT CHEM 8, ED
BUN: 19 mg/dL (ref 6–20)
CHLORIDE: 103 mmol/L (ref 101–111)
CREATININE: 1.4 mg/dL — AB (ref 0.61–1.24)
Calcium, Ion: 1.17 mmol/L (ref 1.15–1.40)
Glucose, Bld: 88 mg/dL (ref 65–99)
HEMATOCRIT: 33 % — AB (ref 39.0–52.0)
Hemoglobin: 11.2 g/dL — ABNORMAL LOW (ref 13.0–17.0)
Potassium: 3.4 mmol/L — ABNORMAL LOW (ref 3.5–5.1)
Sodium: 138 mmol/L (ref 135–145)
TCO2: 28 mmol/L (ref 0–100)

## 2016-12-25 LAB — CBG MONITORING, ED: GLUCOSE-CAPILLARY: 85 mg/dL (ref 65–99)

## 2016-12-25 LAB — ETHANOL: Alcohol, Ethyl (B): <5 mg/dL (ref <5)

## 2016-12-25 MED ORDER — BRIMONIDINE TARTRATE 0.2 % OP SOLN
1.0000 [drp] | Freq: Two times a day (BID) | OPHTHALMIC | Status: DC
  Administered 2016-12-25 – 2016-12-31 (×11): 1 [drp] via OPHTHALMIC
  Filled 2016-12-25: qty 5

## 2016-12-25 MED ORDER — POLYVINYL ALCOHOL 1.4 % OP SOLN
1.0000 [drp] | Freq: Every day | OPHTHALMIC | Status: DC | PRN
  Filled 2016-12-25: qty 15

## 2016-12-25 MED ORDER — STROKE: EARLY STAGES OF RECOVERY BOOK
Freq: Once | Status: DC
  Filled 2016-12-25: qty 1

## 2016-12-25 MED ORDER — ENOXAPARIN SODIUM 40 MG/0.4ML ~~LOC~~ SOLN
40.0000 mg | SUBCUTANEOUS | Status: DC
  Administered 2016-12-25: 40 mg via SUBCUTANEOUS
  Filled 2016-12-25: qty 0.4

## 2016-12-25 MED ORDER — LEVOTHYROXINE SODIUM 200 MCG PO TABS
200.0000 ug | ORAL_TABLET | Freq: Every day | ORAL | Status: DC
  Filled 2016-12-25 (×5): qty 1

## 2016-12-25 MED ORDER — INSULIN GLARGINE 100 UNIT/ML ~~LOC~~ SOLN
13.0000 [IU] | Freq: Every day | SUBCUTANEOUS | Status: DC
  Filled 2016-12-25 (×5): qty 0.13

## 2016-12-25 MED ORDER — SERTRALINE HCL 100 MG PO TABS: 200.0000 mg | ORAL_TABLET | Freq: Every day | ORAL | Status: DC

## 2016-12-25 MED ORDER — ENOXAPARIN SODIUM 100 MG/ML ~~LOC~~ SOLN: 100.0000 mg | Freq: Two times a day (BID) | SUBCUTANEOUS | Status: DC

## 2016-12-25 MED ORDER — HYPROMELLOSE (GONIOSCOPIC) 2.5 % OP SOLN: 1.0000 [drp] | Freq: Four times a day (QID) | OPHTHALMIC | Status: DC | PRN

## 2016-12-25 MED ORDER — KETOROLAC TROMETHAMINE 15 MG/ML IJ SOLN
15.0000 mg | Freq: Once | INTRAMUSCULAR | Status: AC
  Administered 2016-12-25: 15 mg via INTRAVENOUS
  Filled 2016-12-25: qty 1

## 2016-12-25 MED ORDER — APIXABAN 2.5 MG PO TABS: 2.5000 mg | ORAL_TABLET | Freq: Two times a day (BID) | ORAL | Status: DC

## 2016-12-25 MED ORDER — ACETAMINOPHEN 160 MG/5ML PO SOLN: 650.0000 mg | ORAL | Status: DC | PRN

## 2016-12-25 MED ORDER — DEXTROSE 50 % IV SOLN
1.0000 | Freq: Once | INTRAVENOUS | Status: AC
  Administered 2016-12-25: 50 mL via INTRAVENOUS
  Filled 2016-12-25: qty 50

## 2016-12-25 MED ORDER — MOMETASONE FURO-FORMOTEROL FUM 200-5 MCG/ACT IN AERO
2.0000 | INHALATION_SPRAY | Freq: Two times a day (BID) | RESPIRATORY_TRACT | Status: DC
  Administered 2016-12-25 – 2016-12-26 (×3): 2 via RESPIRATORY_TRACT
  Filled 2016-12-25: qty 8.8

## 2016-12-25 MED ORDER — ACETAMINOPHEN 325 MG PO TABS: 650.0000 mg | ORAL_TABLET | ORAL | Status: DC | PRN

## 2016-12-25 MED ORDER — IOPAMIDOL (ISOVUE-370) INJECTION 76%
100.0000 mL | Freq: Once | INTRAVENOUS | Status: AC | PRN
Start: 2016-12-25 — End: 2016-12-25
  Administered 2016-12-25: 100 mL via INTRAVENOUS

## 2016-12-25 MED ORDER — BENZONATATE 100 MG PO CAPS
100.0000 mg | ORAL_CAPSULE | Freq: Two times a day (BID) | ORAL | Status: DC | PRN
  Filled 2016-12-25: qty 1

## 2016-12-25 MED ORDER — ACETAMINOPHEN 650 MG RE SUPP: 650.0000 mg | RECTAL | Status: DC | PRN

## 2016-12-25 MED ORDER — TAMSULOSIN HCL 0.4 MG PO CAPS: 0.4000 mg | ORAL_CAPSULE | Freq: Every day | ORAL | Status: DC

## 2016-12-25 MED ORDER — INSULIN ASPART 100 UNIT/ML ~~LOC~~ SOLN
0.0000 [IU] | SUBCUTANEOUS | Status: DC
  Administered 2016-12-26 – 2016-12-27 (×2): 1 [IU] via SUBCUTANEOUS
  Administered 2016-12-28: 2 [IU] via SUBCUTANEOUS
  Administered 2016-12-28: 1 [IU] via SUBCUTANEOUS
  Administered 2016-12-28: 2 [IU] via SUBCUTANEOUS
  Administered 2016-12-28 – 2016-12-29 (×2): 1 [IU] via SUBCUTANEOUS
  Administered 2016-12-29: 2 [IU] via SUBCUTANEOUS
  Administered 2016-12-29: 1 [IU] via SUBCUTANEOUS
  Administered 2016-12-29 – 2016-12-30 (×8): 2 [IU] via SUBCUTANEOUS
  Administered 2016-12-30: 1 [IU] via SUBCUTANEOUS
  Administered 2016-12-31: 5 [IU] via SUBCUTANEOUS

## 2016-12-25 NOTE — Progress Notes (Signed)
Pt received from ED drowsy with no noted distress. Pt stable, able to follow simple commands. Safety measures in place. Telemetry applied. Call bell within reach. Will continue to monitor.

## 2016-12-25 NOTE — ED Triage Notes (Signed)
To ED via [facility] [place]-- with staff stating that pt was found at 0845 with right gaze and facial droop. Pt is normally alert/ responsive, but bedridden. Staff did say that he wears a c-pap machine and seemed more sluggish then normal.  On arrival -- pt has a gaze to right, facial droop, will follow commands,  Stroke team at bridge on arrival-- see stroke RN note.

## 2016-12-25 NOTE — Progress Notes (Signed)
Spoke with Md who stated that she would come to speak with wife. Unable to administer po medication. Pt NPO, too lethargic to perform swallow evaluation. Md aware. Pt complaint of headache Md will put in order for IV medication.

## 2016-12-25 NOTE — Consult Note (Signed)
Requesting Physician: Dr. Eudelia Bunch    Chief Complaint: Code Stroke  History obtained from:  EMS and nursing home  HPI:                                                                                                                                         [name] is an 71 y.o. male lives at a nursing home currently. Patient has had a stroke in the past. He currently is on anticoagulation secondary to DVT.  It is very unclear after calling the nursing home was exact last known normal was but apparently he was acting himself at approximate 6:00. His baseline is wheelchair bound and bed bound. Patient does not talk but answers with single yes or no answers and nodding his head. Patient was noted this morning to be very abnormal as he was not communicating nor following commands thus EMS was called. Due to his previous stroke and abnormality of mental status he is brought in as a code stroke. CT of head was negative for any acute stroke or bleed. CT angiogram of head and neck were also obtained awaiting final reading. Patient was not a TPA candidate due to his anticoagulation and out of the window. He was not a intra-arterial candidate secondary to his significant debilitation at baseline.  Date last known well: Date: 12/24/2016 Time last known well: Time: 18:00 tPA Given: No: on AC and no last seen normal to be exact   Past Medical History:  Diagnosis Date  . Autoimmune hepatitis (HCC)   . C. difficile colitis   . Cirrhosis (HCC)   . Diabetes mellitus without complication (HCC)   . DVT (deep vein thrombosis) in pregnancy (HCC)   . Heart failure (HCC)   . Hypertension   . Hypothyroidism   . Presence of permanent cardiac pacemaker   . PTSD (post-traumatic stress disorder)   . Stroke [facility])     Past Surgical History:  Procedure Laterality Date  . ESOPHAGOGASTRODUODENOSCOPY N/A 04/07/2015   Procedure: ESOPHAGOGASTRODUODENOSCOPY (EGD);  Surgeon: [name] Dare, MD;  Location: [facility]  END[facility];  Service: Endoscopy;  Laterality: N/A;  . INSERT / REPLACE / REMOVE PACEMAKER    . TONSILLECTOMY      Family History  Problem Relation Age of Onset  . Diabetes Mellitus II Sister   . CAD Sister   . Hypertension Sister    Social History:  reports that he has quit smoking. He has never used smokeless tobacco. He reports that he does not drink alcohol or use drugs.  Allergies: No Known Allergies  Medications:  No current facility-administered medications for this encounter.    Current Outpatient Prescriptions  Medication Sig Dispense Refill  . acetaminophen (TYLENOL) 325 MG tablet Take 650 mg by mouth every 6 (six) hours as needed.    Marland Kitchen. acidophilus (RISAQUAD) CAPS capsule Take by mouth daily.    Marland Kitchen. amLODipine (NORVASC) 10 MG tablet Take 1 tablet (10 mg total) by mouth daily.    Marland Kitchen. apixaban (ELIQUIS) 2.5 MG TABS tablet Take 2.5 mg by mouth 2 (two) times daily.    . benzonatate (TESSALON) 100 MG capsule Take by mouth 3 (three) times daily as needed for cough.    . brimonidine (ALPHAGAN) 0.2 % ophthalmic solution Place 1 drop into the left eye 2 (two) times daily.    . budesonide-formoterol (SYMBICORT) 160-4.5 MCG/ACT inhaler Inhale 2 puffs into the lungs 2 (two) times daily.    . cephALEXin (KEFLEX) 500 MG capsule Take 1 capsule (500 mg total) by mouth 3 (three) times daily. 30 capsule 0  . cholecalciferol (VITAMIN D) 1000 UNITS tablet Take 2,000 Units by mouth daily.    . hydroxypropyl methylcellulose / hypromellose (ISOPTO TEARS / GONIOVISC) 2.5 % ophthalmic solution Place 1 drop into both eyes 4 (four) times daily as needed for dry eyes (dry eyes).    . insulin glargine (LANTUS) 100 UNIT/ML injection Inject 26 Units into the skin at bedtime.  10 mL 0  . insulin lispro (HUMALOG) 100 UNIT/ML injection Inject 6-8 Units into the skin 2 (two) times daily. 6 units  before lunch 8 units before dinner    . ketoconazole (NIZORAL) 2 % cream Apply 1 application topically daily.    Marland Kitchen. latanoprost (XALATAN) 0.005 % ophthalmic solution Place 1 drop into both eyes at bedtime.     Marland Kitchen. levothyroxine (SYNTHROID, LEVOTHROID) 200 MCG tablet Take 200 mcg by mouth daily before breakfast.    . loperamide (IMODIUM A-D) 2 MG tablet Take 2 mg by mouth 3 (three) times daily as needed for diarrhea or loose stools.    . metoprolol succinate (TOPROL-XL) 50 MG 24 hr tablet Take 1 tablet (50 mg total) by mouth daily. Take with or immediately following a meal. 30 tablet 0  . polyethylene glycol (MIRALAX / GLYCOLAX) packet Take 17 g by mouth daily.    . polyvinyl alcohol (LIQUIFILM TEARS) 1.4 % ophthalmic solution Place 1 drop into both eyes daily as needed for dry eyes. 15 mL 0  . ranitidine (ZANTAC) 150 MG tablet Take 150 mg by mouth daily.    . sertraline (ZOLOFT) 100 MG tablet Take 200 mg by mouth daily.    . tamsulosin (FLOMAX) 0.4 MG CAPS capsule Take 1 capsule (0.4 mg total) by mouth daily. 30 capsule   . traMADol (ULTRAM) 50 MG tablet Take 1 tablet (50 mg total) by mouth every 6 (six) hours as needed. 20 tablet 0     ROS:  History obtained from unobtainable from patient due to mental status   Neurologic Examination:                                                                                                      Blood pressure 144/82.  HEENT-  Normocephalic, no lesions, without obvious abnormality.  Normal external eye and conjunctiva.  Normal TM's bilaterally.  Normal auditory canals and external ears. Normal external nose, mucus membranes and septum.  Normal pharynx. Cardiovascular- S1, S2 normal, pulses palpable throughout   Lungs- chest clear, no wheezing, rales, normal symmetric air entry Abdomen- normal findings: no bruits  heard Extremities- no skin discoloration Lymph-no adenopathy palpable Musculoskeletal-no joint tenderness, deformity or swelling Skin-warm and dry, no hyperpigmentation, vitiligo, or suspicious lesions  Neurological Examination Mental Status: Patient will open his eyes to voice, he will follow very simple commands such as attempting to raise his arms and squeeze my fingers. Cranial Nerves: II: Blinks to threat on the right but not the left III,IV, VI: Eyes with forced deviation to the right, right pupil reactive and 2 mm, left pupil nonreactive and abnormally shaped secondary to surgery. Doll's intact and does cross midline V,VII: Left facial droop VIII: hearing normal bilaterally IX,X: Unable to visualize uvula XI: Unable to assess XII: midline tongue extension Motor: Patient is able to hold bilateral arms approximately 1 foot off the bed for 10 seconds with no drift. Strength with biceps flexion is 5 out of 5 tricep extension is 4 out of 5 he does show significant atrophy in the interosseous and thenar muscles of his right hand and appears to have increased tone and flexion contracture of bilateral hands. Unable to keep bilateral legs off the bed. Sensory: Winces and discomfort throughout to noxious stimuli Deep Tendon Reflexes: Depressed and symmetric throughout upper extremities with no knee jerk or ankle jerk bilaterally Plantars: Right: downgoing   Left: downgoing Cerebellar: Unable to assess  Gait: Not tested       Lab Results: Basic Metabolic Panel:  Recent Labs Lab 12/25/16 1004  NA 138  K 3.4*  CL 103  GLUCOSE 88  BUN 19  CREATININE 1.40*    Liver Function Tests: No results for input(s): AST, ALT, ALKPHOS, BILITOT, PROT, ALBUMIN in the last 168 hours. No results for input(s): LIPASE, AMYLASE in the last 168 hours. No results for input(s): AMMONIA in the last 168 hours.  CBC:  Recent Labs Lab 12/25/16 0957 12/25/16 1004  WBC 7.5  --   NEUTROABS 2.8   --   HGB 11.6* 11.2*  HCT 34.8* 33.0*  MCV 81.9  --   PLT 148*  --     Cardiac Enzymes: No results for input(s): CKTOTAL, CKMB, CKMBINDEX, TROPONINI in the last 168 hours.  Lipid Panel: No results for input(s): CHOL, TRIG, HDL, CHOLHDL, VLDL, LDLCALC in the last 168 hours.  CBG:  Recent Labs Lab 12/25/16 1000  GLUCAP 85    Microbiology: Results for orders placed or performed in visit on 06/25/16  Microscopic Examination     Status: Abnormal   Collection Time:  06/25/16  1:35 PM  Result Value Ref Range Status   WBC, UA 0-5 0 - 5 /hpf Final   RBC, UA 11-30 (A) 0 - 2 /hpf Final   Epithelial Cells (non renal) None seen 0 - 10 /hpf Final   Casts Present (A) None seen /lpf Final   Cast Type Hyaline casts N/A Final    Comment: Granular casts   Bacteria, UA None seen None seen/Few Final    Coagulation Studies:  Recent Labs  12/25/16 0957  LABPROT 15.0  INR 1.17    Imaging: Ct Head Code Stroke W/o Cm  Result Date: 12/25/2016 CLINICAL DATA:  Code stroke. Right-sided gaze. Left-sided weakness. EXAM: CT HEAD WITHOUT CONTRAST TECHNIQUE: Contiguous axial images were obtained from the base of the skull through the vertex without intravenous contrast. COMPARISON:  07/02/2016 FINDINGS: Brain: There are new, relatively well-defined patchy to confluent foci of hypoattenuation at the right basal ganglia level involving the lentiform nucleus and likely portions of the caudate nucleus and internal capsule. This extends into the right corona radiata. Patchy hypodensities elsewhere in the cerebral white matter bilaterally are nonspecific but compatible with moderate chronic small vessel ischemic disease, similar to the prior study. No definite acute large territory cortical infarct is identified. There is no evidence of acute intracranial hemorrhage, mass, midline shift, extra-axial fluid collection. Vascular: Calcified atherosclerosis at the skullbase. No acute hyperdense vessel. Dense  appearance of the right greater than left MCAs in the bifurcations/ proximal M2 regions is unchanged from the prior study and likely reflective of atherosclerosis. Skull: No fracture focal osseous lesion. Sinuses/Orbits: Moderate right and mild left ethmoid air cell opacification. Moderate right and trace left sphenoid sinus fluid. Minimal bilateral maxillary sinus mucosal thickening. Clear mastoid air cells. Bilateral cataract extraction. Other: None. ASPECTS [facility] Stroke Program Early CT Score) - Ganglionic level infarction (caudate, lentiform nuclei, internal capsule, insula, M1-M3 cortex): 4 - Supraganglionic infarction (M4-M6 cortex): 3 Total score (0-10 with 10 being normal): 7 IMPRESSION: 1. Acute to early subacute right lateral lenticulostriate territory infarct. No acute hemorrhage. 2. ASPECTS is 7. 3. Moderate chronic small vessel ischemic disease and cerebral atrophy. These results were called by telephone at the time of interpretation on 12/25/2016 at 10:21 am to Dr. Amada Jupiter, who verbally acknowledged these results. Electronically Signed   By: Sebastian Ache M.D.   On: 12/25/2016 10:25       Assessment and plan discussed with with attending physician and they are in agreement.    Felicie Morn PA-C [facility] [phone]  12/25/2016, 10:45 AM   Assessment: 71 y.o. male resented with new left-sided weakness and right gaze preference. On CT, he has been found to have a subacute lenticulostriate stride stroke.  Stroke Risk Factors - hypertension  1. HgbA1c, fasting lipid panel 2. MRI, MRA  of the brain without contrast 3. Frequent neuro checks 4. Echocardiogram 5. Carotid dopplers 6. Prophylactic therapy-Antiplatelet med: Aspirin - dose 325mg  PO or 300mg  PR 7. Risk factor modification 8. Telemetry monitoring 9. PT consult, OT consult, Speech consult 10. please page stroke NP  Or  PA  Or MD  from 8am -4 pm starting 3/1 as this patient will be followed by the stroke  team at this point.   You can look them up on [email or web address]    Ritta Slot, MD [facility] [phone]  If 7pm- 7am, please page neurology on call as listed in AMION.

## 2016-12-25 NOTE — Code Documentation (Signed)
71 y.o. Male with PMH of HTN, DM and priorCVA, [facility]. [facility] [place]. [facility], left sided hemiparesis and a left sided facial droop. According to EMS, staff at the facility was unable to establish a LKW. Neuro PA to call facility. [facility], labs were drawn and pt was taken to CT3. CT showing acute to early subacute right lateral lenticulostriate territory infarct. No acute hemorrhage. ASPECTS is 7. Order for CTA and CTP. Pt moved to CT2 and 18g PIV established. CTA and CTP pending. NIHSS 16. See EMR for NIHSS and code stroke times. On assessment, pt with right gaze, generalized weakness, BLE not moving against gravity, BUE with no drift and non-verbal. tPA not given d/t being out of the window. Not an IR candidate d/t PmRS. Bedside handoff with ED RN Clydie BraunKaren

## 2016-12-25 NOTE — Progress Notes (Signed)
RT NOTE:  CPAP setup and placed on pt. Wife is @ bedside and called SNF where patient lives to verify settings of Auto titrate 5-15. No O2 bled in. Humidity chamber filled with sterile water. Pt resting comfortably @ this time.

## 2016-12-25 NOTE — Progress Notes (Signed)
Wife in pt room requesting to speak with Md. Md paged per request. Pt asleep at this time. No noted distress.

## 2016-12-25 NOTE — Progress Notes (Signed)
ANTICOAGULATION CONSULT NOTE - Initial Consult  Pharmacy Consult for Lovenox Indication: History of DVT  No Known Allergies  Patient Measurements: Weight: 230 lb (104.3 kg)  Vital Signs: Temp: 98.5 F (36.9 C) (02/28 2000) Temp Source: Oral (02/28 2000) BP: 146/74 (02/28 2000) Pulse Rate: 59 (02/28 2000)  Labs:  Recent Labs  12/25/16 0957 12/25/16 1004  HGB 11.6* 11.2*  HCT 34.8* 33.0*  PLT 148*  --   APTT 37*  --   LABPROT 15.0  --   INR 1.17  --   CREATININE 1.29* 1.40*    Estimated Creatinine Clearance: 62.3 mL/min (by C-G formula based on SCr of 1.4 mg/dL (H)).   Medical History: Past Medical History:  Diagnosis Date  . Autoimmune hepatitis (HCC)   . C. difficile colitis   . Cirrhosis (HCC)   . Diabetes mellitus without complication (HCC)   . DVT (deep venous thrombosis) (HCC)   . Heart failure (HCC)   . Hypertension   . Hypothyroidism   . Presence of permanent cardiac pacemaker   . PTSD (post-traumatic stress disorder)   . Stroke [facility](HCC)     Assessment: 70 YOM from nursing home admitted with left-sided weakness, code stroke was activated. CT -  found to have a subacute lenticulostriate stride stroke. No tPA given d/t pt is on Eliquis PTA for remote history of DVT. Pt failed swallow evaluation, and pharmacy is consulted to start lovenox.   Per record, pt is currently on eliquis 2.5 mg BID, which is not acute DVT treatment dose. Discussed with Dr. Obie DredgeBlum and Dr. Otelia LimesLindzen, both agreed only do VTE prophylaxis do lovenox for now, since full dose lovenox will have higher risk for hemorrhagic transformation in the setting of a acute stroke. Crcl ~ 60  Last Eliquis dose on 2/27 AM   Goal of Therapy:  Monitor platelets by anticoagulation protocol: Yes   Plan:  Lovenox 40 mg sq Q  Will f/u restarting Eliquis when appropriate.  Bayard HuggerMei Malu Pellegrini, PharmD, BCPS  Clinical Pharmacist  Pager: [phone]   12/25/2016,9:34 PM

## 2016-12-25 NOTE — H&P (Signed)
Date: 12/25/2016               Patient Name:  [name] MRN: [id number]  DOB: 10-Feb-1946 Age / Sex: 71 y.o., male   PCP: Keane Police, MD         Medical Service: Internal Medicine Teaching Service         Attending Physician: Dr. Gust Rung, DO    First Contact: Dr. Samuella Cota Pager: [phone]  Second Contact: Dr. Dimple Casey Pager: [phone]       After Hours (After 5p/  First Contact Pager: [phone]  weekends / holidays): Second Contact Pager: [phone]   Chief Complaint: code stroke  History of Present Illness:   71 yo male with multiple PMH including HTN, hx of CVA, hypothyroidism, DVT on Eliquis, DM II, CKD 3,  S/p pacemaker,  LVH, Who lives in a nursing home and presents today after staff finding him around 8:45 AM with right sided gaze, left facial droop, and left sided weakness. Code stroke was called. Out of tPA window. Last seen normal was 5-6 pm yesterday during dinner. At baseline, he is bed bound, answers simple questions by nodding, but does not have the droop or the gaze preference.    CT head: Acute to early subacute right lateral lenticulostriate territory infarct. No acute hemorrhage, chronic microvessel disease.  CTA neck:  No significant finding.  Vessels widely patent. CTA head: Widespread intracranial atherosclerotic disease. Supraclinoid ICA stenoses, 50% on the right and 70% on the left. Atherosclerotic narrowing of the M1 segments bilaterally, 30% on the right and 50% on the left. Atherosclerotic disease in the PCA territories bilaterally with focal stenoses 70% or greater on each side. Distal vessel atherosclerotic irregularity.  Per neuro, He is not a intra-arterial candidate secondary to his significant debilitation at baseline.  Patient denies any chest pain, sob, recent fevers, diarrhea, n/v, dysuria, cough.   Meds:  No outpatient prescriptions have been marked as taking for the 12/25/16 encounter [facility] Encounter).   No current  facility-administered medications on file prior to encounter.    Current Outpatient Prescriptions on File Prior to Encounter  Medication Sig  . acetaminophen (TYLENOL) 325 MG tablet Take 650 mg by mouth every 6 (six) hours as needed.  Marland Kitchen acidophilus (RISAQUAD) CAPS capsule Take by mouth daily.  Marland Kitchen amLODipine (NORVASC) 10 MG tablet Take 1 tablet (10 mg total) by mouth daily.  Marland Kitchen apixaban (ELIQUIS) 2.5 MG TABS tablet Take 2.5 mg by mouth 2 (two) times daily.  . benzonatate (TESSALON) 100 MG capsule Take by mouth 3 (three) times daily as needed for cough.  . brimonidine (ALPHAGAN) 0.2 % ophthalmic solution Place 1 drop into the left eye 2 (two) times daily.  . budesonide-formoterol (SYMBICORT) 160-4.5 MCG/ACT inhaler Inhale 2 puffs into the lungs 2 (two) times daily.  . cephALEXin (KEFLEX) 500 MG capsule Take 1 capsule (500 mg total) by mouth 3 (three) times daily.  . cholecalciferol (VITAMIN D) 1000 UNITS tablet Take 2,000 Units by mouth daily.  . hydroxypropyl methylcellulose / hypromellose (ISOPTO TEARS / GONIOVISC) 2.5 % ophthalmic solution Place 1 drop into both eyes 4 (four) times daily as needed for dry eyes (dry eyes).  . insulin glargine (LANTUS) 100 UNIT/ML injection Inject 26 Units into the skin at bedtime.   . insulin lispro (HUMALOG) 100 UNIT/ML injection Inject 6-8 Units into the skin 2 (two) times daily. 6 units before lunch 8 units before dinner  . ketoconazole (NIZORAL) 2 % cream Apply  1 application topically daily.  Marland Kitchen. latanoprost (XALATAN) 0.005 % ophthalmic solution Place 1 drop into both eyes at bedtime.   Marland Kitchen. levothyroxine (SYNTHROID, LEVOTHROID) 200 MCG tablet Take 200 mcg by mouth daily before breakfast.  . loperamide (IMODIUM A-D) 2 MG tablet Take 2 mg by mouth 3 (three) times daily as needed for diarrhea or loose stools.  . metoprolol succinate (TOPROL-XL) 50 MG 24 hr tablet Take 1 tablet (50 mg total) by mouth daily. Take with or immediately following a meal.  . polyethylene  glycol (MIRALAX / GLYCOLAX) packet Take 17 g by mouth daily.  . polyvinyl alcohol (LIQUIFILM TEARS) 1.4 % ophthalmic solution Place 1 drop into both eyes daily as needed for dry eyes.  . ranitidine (ZANTAC) 150 MG tablet Take 150 mg by mouth daily.  . sertraline (ZOLOFT) 100 MG tablet Take 200 mg by mouth daily.  . tamsulosin (FLOMAX) 0.4 MG CAPS capsule Take 1 capsule (0.4 mg total) by mouth daily.  . traMADol (ULTRAM) 50 MG tablet Take 1 tablet (50 mg total) by mouth every 6 (six) hours as needed.      Allergies: Allergies as of 12/25/2016  . (No Known Allergies)   Past Medical History:  Diagnosis Date  . Autoimmune hepatitis (HCC)   . C. difficile colitis   . Cirrhosis (HCC)   . Diabetes mellitus without complication (HCC)   . DVT (deep vein thrombosis) in pregnancy (HCC)   . Heart failure (HCC)   . Hypertension   . Hypothyroidism   . Presence of permanent cardiac pacemaker   . PTSD (post-traumatic stress disorder)   . Stroke [facility](HCC)     Family History:  Family History  Problem Relation Age of Onset  . Diabetes Mellitus II Sister   . CAD Sister   . Hypertension Sister      Social History:  Social History   Social History  . Marital status: Married    Spouse name: N/A  . Number of children: N/A  . Years of education: N/A   Social History Main Topics  . Smoking status: Former Games developermoker  . Smokeless tobacco: Never Used     Comment: quit years ago  . Alcohol use No  . Drug use: No  . Sexual activity: Not Asked   Other Topics Concern  . None   Social History Narrative  . None     Review of Systems: A complete ROS was negative except as per HPI. Limited ROS due to patient's mental status.  Physical Exam: Blood pressure 142/93, pulse 66, temperature (S) 98.4 F (36.9 C), temperature source Rectal, resp. rate 16, weight 230 lb (104.3 kg), SpO2 100 %. Physical Exam  Constitutional:  Chronically ill appearing male, lying in bed, with eyes closed. NAD. Wakes  up to answer questions. Has right sided gaze preference.   HENT:  Head: Normocephalic and atraumatic.  Dry oral mucosa  Eyes:  Right pupil constricted, left pupil dilated and irregularly shaped, both are nonreactive to light. EOM movements are mostly intact, has right sided gaze but does cross midline.  Neck: Normal range of motion. No JVD present.  Cardiovascular: Normal rate.  Exam reveals no gallop and no friction rub.   Systolic murmur  Respiratory: Effort normal and breath sounds normal. No respiratory distress. He has no wheezes.  GI: Soft. Bowel sounds are normal. He exhibits no distension. There is no tenderness.  Musculoskeletal:  5/5 right upper ext. 4/5 left upper ext. Is able to squeeze my hand but weak  grip strength. Is able to hold up against gravity.  4/5 right lower ext, is able to hold up against gravity for short time period but overall weak. 3/5 left lower ext.unable to raise against gravity.    Neurological:  Left sided facial droop. Right sided gaze preference, loss of peripheral vision on left field. Speech seems slurred, unclear what his baseline is. Able to give "yes/no" answers, follows simple commands.      EKG: possible ectopic rhythm, NSR, LBBB, has repol abnormalities.   CXR: poor image, stable cardiomegaly. Has Visual merchandiser. No effusion or infiltrate visible.   Assessment & Plan by Problem: Principal Problem:   Stroke (cerebrum) (HCC) Active Problems:   Diabetes mellitus type 2, uncontrolled (HCC)   Essential hypertension   CKD (chronic kidney disease) stage 3, GFR 30-59 ml/min  71 yo male with hx of stroke, HTN, DM II presents with acute stroke.  Acute ischemic stroke CT head showed Acute to early subacute right lateral lenticulostriate territory infarct. No acute hemorrhage, chronic microvessel disease. CTA neck:  No significant finding.  Vessels widely patent. CTA head: Widespread intracranial atherosclerotic disease with multiple stenosis. Per  neuro, He is not a intra-arterial candidate secondary to his significant debilitation at baseline.Has right gaze preference, left facial droop, left sided weakness, and slurred speech. Per report, this is new. At baseline he is mostly bedbound and only talks in simple one or two word sentences but does not have these neuro deficits. He is out of tPA window and not candidate for any intervention for his diffuse vascular stenosis.  Overall his prognosis is not great given already being bed bound from his previous stroke. He is Full code. Family is not at bedside. GOC discussion will be important when family can come in. - unable to get MRI due to pace maker. ECHO pending - hgba1c, lipid panel.  - PT, OT, SLP - may need antiplatelet but will be high risk for bleed with eliquis for DVT. Will defer to neurology.  DVT -  remote hx, going back to charting to 2012. No description about this whether it was provoked or not or whether there was any recurrence. - cont eliquis for now.   HTN - hold home meds to allow permissive HTN  DM II - no recent hgba1c Cont reduce lantus 13 unit + SSI  hypothyroidism - cont synthyroid  PTSD - cont zoloft  COPD - cont dulera    Dispo: Admit patient to Inpatient with expected length of stay greater than 2 midnights.  Signed: Hyacinth Meeker, MD 12/25/2016, 11:23 AM  Pager: [phone]

## 2016-12-25 NOTE — ED Provider Notes (Signed)
[facility]-EMERGENCY DEPT Provider Note   CSN: [id number] Arrival date & time: 12/25/16  4098     History   Chief Complaint No chief complaint on file.   HPI [name] is a 71 y.o. male.  HPI  71 y.o. male with a hx of DM, HTN, Cirrhosis, Stroke, presents to the Emergency Department today as Code Stroke from [facility] this AM in [place]. Found by staff around 0845. Right sided gaze and facial droop. LSN yesterday evening around 5-6 during dinner time. Pt baseline consistent of on and off speech, with bed bound as baseline. Noted new onset left sided weakness with right side gaze. Noted slurred speech. Pt is full code. Pt is anticoagulated with Eliquis due to DVT in past.   Level V Caveat: AMS  Past Medical History:  Diagnosis Date  . Autoimmune hepatitis (HCC)   . C. difficile colitis   . Cirrhosis (HCC)   . Diabetes mellitus without complication (HCC)   . DVT (deep vein thrombosis) in pregnancy (HCC)   . Heart failure (HCC)   . Hypertension   . Hypothyroidism   . Presence of permanent cardiac pacemaker   . PTSD (post-traumatic stress disorder)   . Stroke [facility])     Patient Active Problem List   Diagnosis Date Noted  . Autoimmune hepatitis (HCC)   . C. difficile colitis 04/11/2015  . HCAP (healthcare-associated pneumonia) 04/11/2015  . Hypokalemia 04/11/2015  . DVT (deep vein thrombosis) in pregnancy (HCC)   . Elevated LFTs   . Acute encephalopathy   . Erosive gastritis   . Duodenitis   . Elevated liver function tests 04/03/2015  . Diarrhea 04/03/2015  . Weakness 04/03/2015  . Diabetes mellitus type 2, uncontrolled (HCC) 04/03/2015  . Hypothyroidism 04/03/2015  . Essential hypertension 04/03/2015  . Gallstones 04/03/2015  . CKD (chronic kidney disease) stage 3, GFR 30-59 ml/min 04/03/2015    Past Surgical History:  Procedure Laterality Date  . ESOPHAGOGASTRODUODENOSCOPY N/A 04/07/2015   Procedure: ESOPHAGOGASTRODUODENOSCOPY (EGD);  Surgeon:  [name] Dare, MD;  Location: [facility];  Service: Endoscopy;  Laterality: N/A;  . INSERT / REPLACE / REMOVE PACEMAKER    . TONSILLECTOMY         Home Medications    Prior to Admission medications   Medication Sig Start Date End Date Taking? Authorizing Provider  acetaminophen (TYLENOL) 325 MG tablet Take 650 mg by mouth every 6 (six) hours as needed.    Historical Provider, MD  acidophilus (RISAQUAD) CAPS capsule Take by mouth daily.    Historical Provider, MD  amLODipine (NORVASC) 10 MG tablet Take 1 tablet (10 mg total) by mouth daily. 04/08/15   Leana Roe Elgergawy, MD  apixaban (ELIQUIS) 2.5 MG TABS tablet Take 2.5 mg by mouth 2 (two) times daily.    Historical Provider, MD  benzonatate (TESSALON) 100 MG capsule Take by mouth 3 (three) times daily as needed for cough.    Historical Provider, MD  brimonidine (ALPHAGAN) 0.2 % ophthalmic solution Place 1 drop into the left eye 2 (two) times daily.    Historical Provider, MD  budesonide-formoterol (SYMBICORT) 160-4.5 MCG/ACT inhaler Inhale 2 puffs into the lungs 2 (two) times daily.    Historical Provider, MD  cephALEXin (KEFLEX) 500 MG capsule Take 1 capsule (500 mg total) by mouth 3 (three) times daily. 07/02/16   Phineas Semen, MD  cholecalciferol (VITAMIN D) 1000 UNITS tablet Take 2,000 Units by mouth daily.    Historical Provider, MD  hydroxypropyl methylcellulose /  hypromellose (ISOPTO TEARS / GONIOVISC) 2.5 % ophthalmic solution Place 1 drop into both eyes 4 (four) times daily as needed for dry eyes (dry eyes).    Historical Provider, MD  insulin glargine (LANTUS) 100 UNIT/ML injection Inject 26 Units into the skin at bedtime.  05/29/15   Monina C Medina-Vargas, NP  insulin lispro (HUMALOG) 100 UNIT/ML injection Inject 6-8 Units into the skin 2 (two) times daily. 6 units before lunch 8 units before dinner    Historical Provider, MD  ketoconazole (NIZORAL) 2 % cream Apply 1 application topically daily.    Historical Provider, MD    latanoprost (XALATAN) 0.005 % ophthalmic solution Place 1 drop into both eyes at bedtime.     Historical Provider, MD  levothyroxine (SYNTHROID, LEVOTHROID) 200 MCG tablet Take 200 mcg by mouth daily before breakfast.    Historical Provider, MD  loperamide (IMODIUM A-D) 2 MG tablet Take 2 mg by mouth 3 (three) times daily as needed for diarrhea or loose stools.    Historical Provider, MD  metoprolol succinate (TOPROL-XL) 50 MG 24 hr tablet Take 1 tablet (50 mg total) by mouth daily. Take with or immediately following a meal. 04/15/15   Rodolph Bong, MD  polyethylene glycol [facility] / Ethelene Hal) packet Take 17 g by mouth daily.    Historical Provider, MD  polyvinyl alcohol (LIQUIFILM TEARS) 1.4 % ophthalmic solution Place 1 drop into both eyes daily as needed for dry eyes. 04/08/15   Leana Roe Elgergawy, MD  ranitidine (ZANTAC) 150 MG tablet Take 150 mg by mouth daily.    Historical Provider, MD  sertraline (ZOLOFT) 100 MG tablet Take 200 mg by mouth daily.    Historical Provider, MD  tamsulosin (FLOMAX) 0.4 MG CAPS capsule Take 1 capsule (0.4 mg total) by mouth daily. 04/08/15   Leana Roe Elgergawy, MD  traMADol (ULTRAM) 50 MG tablet Take 1 tablet (50 mg total) by mouth every 6 (six) hours as needed. 06/10/16 06/10/17  Minna Antis, MD    Family History Family History  Problem Relation Age of Onset  . Diabetes Mellitus II Sister   . CAD Sister   . Hypertension Sister     Social History Social History  Substance Use Topics  . Smoking status: Former Games developer  . Smokeless tobacco: Never Used     Comment: quit years ago  . Alcohol use No     Allergies   Patient has no known allergies.   Review of Systems Review of Systems  Unable to perform ROS: Mental status change   Physical Exam Updated Vital Signs BP 142/93   Pulse 66   Temp (S) 98.4 F (36.9 C) (Rectal) Comment: pt has 99.8 axillary temp in CT  Resp 16   Wt 104.3 kg   SpO2 100%   BMI 28.75 kg/m   Physical Exam   Constitutional: He is oriented to person, place, and time. He appears well-developed and well-nourished. No distress.  HENT:  Head: Normocephalic and atraumatic.  Right Ear: Tympanic membrane, external ear and ear canal normal.  Left Ear: Tympanic membrane, external ear and ear canal normal.  Nose: Nose normal.  Mouth/Throat: Uvula is midline, oropharynx is clear and moist and mucous membranes are normal. No trismus in the jaw. No oropharyngeal exudate, posterior oropharyngeal erythema or tonsillar abscesses.  Eyes: Pupils are unequal.  Neck: Normal range of motion. Neck supple. No tracheal deviation present.  Cardiovascular: Normal rate, regular rhythm, S1 normal, S2 normal, normal heart sounds, intact distal pulses and  normal pulses.   Pulmonary/Chest: Effort normal. No respiratory distress. He has no decreased breath sounds. He has no wheezes. He has rhonchi in the left upper field. He has no rales.  Abdominal: Normal appearance and bowel sounds are normal. There is no tenderness.  Musculoskeletal: Normal range of motion.  Neurological: He is alert and oriented to person, place, and time.  Right sided gaze. Pupil 6mm on left and 2mm right. Right slow to reaction. Left sided facial droop. Able to follow simple commands with grip strength BUE. Able to pronate BLE.    Skin: Skin is warm and dry.  Psychiatric: He has a normal mood and affect. His speech is normal and behavior is normal. Thought content normal.   ED Treatments / Results  Labs (all labs ordered are listed, but only abnormal results are displayed) Labs Reviewed  APTT - Abnormal; Notable for the following:       Result Value   aPTT 37 (*)    All other components within normal limits  CBC - Abnormal; Notable for the following:    Hemoglobin 11.6 (*)    HCT 34.8 (*)    Platelets 148 (*)    All other components within normal limits  I-STAT CHEM 8, ED - Abnormal; Notable for the following:    Potassium 3.4 (*)     Creatinine, Ser 1.40 (*)    Hemoglobin 11.2 (*)    HCT 33.0 (*)    All other components within normal limits  CULTURE, BLOOD (ROUTINE X 2)  CULTURE, BLOOD (ROUTINE X 2)  PROTIME-INR  DIFFERENTIAL  ETHANOL  COMPREHENSIVE METABOLIC PANEL  RAPID URINE DRUG SCREEN, HOSP PERFORMED  URINALYSIS, ROUTINE W REFLEX MICROSCOPIC  I-STAT TROPOININ, ED  CBG MONITORING, ED  I-STAT CG4 LACTIC ACID, ED  I-STAT ARTERIAL BLOOD GAS, ED    EKG  EKG Interpretation None      Radiology Ct Head Code Stroke W/o Cm  Result Date: 12/25/2016 CLINICAL DATA:  Code stroke. Right-sided gaze. Left-sided weakness. EXAM: CT HEAD WITHOUT CONTRAST TECHNIQUE: Contiguous axial images were obtained from the base of the skull through the vertex without intravenous contrast. COMPARISON:  07/02/2016 FINDINGS: Brain: There are new, relatively well-defined patchy to confluent foci of hypoattenuation at the right basal ganglia level involving the lentiform nucleus and likely portions of the caudate nucleus and internal capsule. This extends into the right corona radiata. Patchy hypodensities elsewhere in the cerebral white matter bilaterally are nonspecific but compatible with moderate chronic small vessel ischemic disease, similar to the prior study. No definite acute large territory cortical infarct is identified. There is no evidence of acute intracranial hemorrhage, mass, midline shift, extra-axial fluid collection. Vascular: Calcified atherosclerosis at the skullbase. No acute hyperdense vessel. Dense appearance of the right greater than left MCAs in the bifurcations/ proximal M2 regions is unchanged from the prior study and likely reflective of atherosclerosis. Skull: No fracture focal osseous lesion. Sinuses/Orbits: Moderate right and mild left ethmoid air cell opacification. Moderate right and trace left sphenoid sinus fluid. Minimal bilateral maxillary sinus mucosal thickening. Clear mastoid air cells. Bilateral cataract  extraction. Other: None. ASPECTS [facility] Stroke Program Early CT Score) - Ganglionic level infarction (caudate, lentiform nuclei, internal capsule, insula, M1-M3 cortex): 4 - Supraganglionic infarction (M4-M6 cortex): 3 Total score (0-10 with 10 being normal): 7 IMPRESSION: 1. Acute to early subacute right lateral lenticulostriate territory infarct. No acute hemorrhage. 2. ASPECTS is 7. 3. Moderate chronic small vessel ischemic disease and cerebral atrophy. These results were called  by telephone at the time of interpretation on 12/25/2016 at 10:21 am to Dr. Amada JupiterKirkpatrick, who verbally acknowledged these results. Electronically Signed   By: Sebastian AcheAllen  Grady M.D.   On: 12/25/2016 10:25    Procedures Procedures (including critical care time) CRITICAL CARE Performed by: Eston Estersyler M Amilio Zehnder   Total critical care time: 35 minutes  Critical care time was exclusive of separately billable procedures and treating other patients.  Critical care was necessary to treat or prevent imminent or life-threatening deterioration.  Critical care was time spent personally by me on the following activities: development of treatment plan with patient and/or surrogate as well as nursing, discussions with consultants, evaluation of patient's response to treatment, examination of patient, obtaining history from patient or surrogate, ordering and performing treatments and interventions, ordering and review of laboratory studies, ordering and review of radiographic studies, pulse oximetry and re-evaluation of patient's condition.   Medications Ordered in ED Medications  iopamidol (ISOVUE-370) 76 % injection 100 mL (100 mLs Intravenous Contrast Given 12/25/16 1012)     Initial Impression / Assessment and Plan / ED Course  I have reviewed the triage vital signs and the nursing notes.  Pertinent labs & imaging results that were available during my care of the patient were reviewed by me and considered in my medical decision making  (see chart for details).  Final Clinical Impressions(s) / ED Diagnoses  {I have reviewed and evaluated the relevant laboratory values. {I have reviewed and evaluated the relevant imaging studies.  {I have reviewed the relevant previous healthcare records. {I have reviewed EMS Documentation. {I obtained HPI from historian. {Patient discussed with supervising physician.  ED Course:  Assessment: Pt is a 71 y.o. male with hx DM, HTN, Cirrhosis, Stroke who presents with AMS from nursing facility. Code stroke initiated. LSN yesterday evening around 1700-1800. Right sided gaze. Pupils unequal. Left facial droop. On exam, Right sided gaze noted. Left sided facial droop. Pt communicates, but poorly. Slurred speech noted. VSS. Afebrile. Lungs with mild rhonchi on left. Heart RRR. Abdomen nontender soft. CT Head with acute to early subacute right lateral lenticulostriate territory infarct. Lactic 0.80. WBC 7.5. Cultures drawn. VSS. Low indication for septicemia. Trop negative. Glucose unremarkable. CXR unremarkable for infiltrate. Neuro concern for possible septicemia 2/2 stroke. Pt not tPA candidate. Full code. Plan is to Admit.   Disposition/Plan:  Admit  Supervising Physician Nira ConnPedro Eduardo Cardama, MD  Final diagnoses:  Cerebrovascular accident (CVA), unspecified mechanism [facility](HCC)    New Prescriptions New Prescriptions   No medications on file     Audry Piliyler Oluwatomisin Hustead, PA-C 12/25/16 1523    Nira ConnPedro Eduardo Cardama, MD 12/25/16 [phone]

## 2016-12-25 NOTE — Progress Notes (Signed)
Patient is NPO and is diabetic. Blood sugar now 74. MD paged to see if patient can have IV fluid.

## 2016-12-26 ENCOUNTER — Encounter (HOSPITAL_COMMUNITY): Payer: Self-pay | Admitting: Internal Medicine

## 2016-12-26 ENCOUNTER — Inpatient Hospital Stay (HOSPITAL_COMMUNITY): Payer: Medicare Other

## 2016-12-26 DIAGNOSIS — Z8619 Personal history of other infectious and parasitic diseases: Secondary | ICD-10-CM

## 2016-12-26 DIAGNOSIS — E876 Hypokalemia: Secondary | ICD-10-CM

## 2016-12-26 DIAGNOSIS — J449 Chronic obstructive pulmonary disease, unspecified: Secondary | ICD-10-CM

## 2016-12-26 DIAGNOSIS — Z7951 Long term (current) use of inhaled steroids: Secondary | ICD-10-CM

## 2016-12-26 DIAGNOSIS — Z8673 Personal history of transient ischemic attack (TIA), and cerebral infarction without residual deficits: Secondary | ICD-10-CM

## 2016-12-26 DIAGNOSIS — Z8249 Family history of ischemic heart disease and other diseases of the circulatory system: Secondary | ICD-10-CM

## 2016-12-26 DIAGNOSIS — K754 Autoimmune hepatitis: Secondary | ICD-10-CM

## 2016-12-26 DIAGNOSIS — I129 Hypertensive chronic kidney disease with stage 1 through stage 4 chronic kidney disease, or unspecified chronic kidney disease: Secondary | ICD-10-CM

## 2016-12-26 DIAGNOSIS — Z87891 Personal history of nicotine dependence: Secondary | ICD-10-CM

## 2016-12-26 DIAGNOSIS — I10 Essential (primary) hypertension: Secondary | ICD-10-CM

## 2016-12-26 DIAGNOSIS — Z794 Long term (current) use of insulin: Secondary | ICD-10-CM

## 2016-12-26 DIAGNOSIS — R531 Weakness: Secondary | ICD-10-CM

## 2016-12-26 DIAGNOSIS — I63411 Cerebral infarction due to embolism of right middle cerebral artery: Secondary | ICD-10-CM

## 2016-12-26 DIAGNOSIS — Z950 Presence of cardiac pacemaker: Secondary | ICD-10-CM

## 2016-12-26 DIAGNOSIS — E785 Hyperlipidemia, unspecified: Secondary | ICD-10-CM | POA: Diagnosis present

## 2016-12-26 DIAGNOSIS — E440 Moderate protein-calorie malnutrition: Secondary | ICD-10-CM | POA: Diagnosis present

## 2016-12-26 DIAGNOSIS — Z7901 Long term (current) use of anticoagulants: Secondary | ICD-10-CM

## 2016-12-26 DIAGNOSIS — I5030 Unspecified diastolic (congestive) heart failure: Secondary | ICD-10-CM

## 2016-12-26 DIAGNOSIS — E1122 Type 2 diabetes mellitus with diabetic chronic kidney disease: Secondary | ICD-10-CM

## 2016-12-26 DIAGNOSIS — F4310 Post-traumatic stress disorder, unspecified: Secondary | ICD-10-CM

## 2016-12-26 DIAGNOSIS — Z833 Family history of diabetes mellitus: Secondary | ICD-10-CM

## 2016-12-26 DIAGNOSIS — Z8719 Personal history of other diseases of the digestive system: Secondary | ICD-10-CM

## 2016-12-26 DIAGNOSIS — Z86718 Personal history of other venous thrombosis and embolism: Secondary | ICD-10-CM

## 2016-12-26 LAB — GLUCOSE, CAPILLARY
GLUCOSE-CAPILLARY: 94 mg/dL (ref 65–99)
GLUCOSE-CAPILLARY: 99 mg/dL (ref 65–99)
GLUCOSE-CAPILLARY: 103 mg/dL — AB (ref 65–99)
GLUCOSE-CAPILLARY: 137 mg/dL — AB (ref 65–99)
Glucose-Capillary: 107 mg/dL — ABNORMAL HIGH (ref 65–99)
Glucose-Capillary: 108 mg/dL — ABNORMAL HIGH (ref 65–99)

## 2016-12-26 LAB — BLOOD CULTURE ID PANEL (REFLEXED)
ACINETOBACTER BAUMANNII: NOT DETECTED
CANDIDA KRUSEI: NOT DETECTED
CANDIDA PARAPSILOSIS: NOT DETECTED
CANDIDA TROPICALIS: NOT DETECTED
Candida albicans: NOT DETECTED
Candida glabrata: NOT DETECTED
ESCHERICHIA COLI: NOT DETECTED
Enterobacter cloacae complex: NOT DETECTED
Enterobacteriaceae species: NOT DETECTED
Enterococcus species: NOT DETECTED
Haemophilus influenzae: NOT DETECTED
KLEBSIELLA OXYTOCA: NOT DETECTED
KLEBSIELLA PNEUMONIAE: NOT DETECTED
Listeria monocytogenes: NOT DETECTED
Methicillin resistance: NOT DETECTED
Neisseria meningitidis: NOT DETECTED
PROTEUS SPECIES: NOT DETECTED
PSEUDOMONAS AERUGINOSA: NOT DETECTED
SERRATIA MARCESCENS: NOT DETECTED
STAPHYLOCOCCUS AUREUS BCID: NOT DETECTED
STREPTOCOCCUS PNEUMONIAE: NOT DETECTED
Staphylococcus species: DETECTED — AB
Streptococcus agalactiae: NOT DETECTED
Streptococcus pyogenes: NOT DETECTED
Streptococcus species: NOT DETECTED

## 2016-12-26 LAB — URINALYSIS, ROUTINE W REFLEX MICROSCOPIC
BACTERIA UA: NONE SEEN
Bilirubin Urine: NEGATIVE
Glucose, UA: 50 mg/dL — AB
KETONES UR: 5 mg/dL — AB
Nitrite: NEGATIVE
PH: 7 (ref 5.0–8.0)
Protein, ur: 100 mg/dL — AB
SPECIFIC GRAVITY, URINE: 1.019 (ref 1.005–1.030)

## 2016-12-26 LAB — ECHOCARDIOGRAM COMPLETE
AV Area VTI: 2.76 cm2
AV Mean grad: 4 mmHg
AV Peak grad: 9 mmHg
AV area mean vel ind: 1.16 cm2/m2
AV peak Index: 1.18
AV vel: 3.1
AVAREAMEANV: 2.71 cm2
AVAREAVTIIND: 1.33 cm2/m2
AVCELMEANRAT: 0.71
AVPKVEL: 146 cm/s
Ao pk vel: 0.73 m/s
CHL CUP AV VALUE AREA INDEX: 1.33
DOP CAL AO MEAN VELOCITY: 96.2 cm/s
E/e' ratio: 5.79
EWDT: 335 ms
FS: 33 % (ref 28–44)
IVS/LV PW RATIO, ED: 1.21
LA diam index: 1.8 cm/m2
LA vol A4C: 69.1 ml
LA vol index: 29.1 mL/m2
LASIZE: 42 mm
LAVOL: 67.8 mL
LEFT ATRIUM END SYS DIAM: 42 mm
LV E/e' medial: 5.79
LV E/e'average: 5.79
LV PW d: 15.3 mm — AB (ref 0.6–1.1)
LV e' LATERAL: 6.32 cm/s
LVOT SV: 79 mL
LVOT VTI: 20.9 cm
LVOT area: 3.8 cm2
LVOTD: 22 mm
LVOTPV: 106 cm/s
LVOTVTI: 0.82 cm
MV Dec: 335
MV pk A vel: 63.4 m/s
MV pk E vel: 36.6 m/s
RV LATERAL S' VELOCITY: 18.2 cm/s
RV TAPSE: 20.8 mm
TDI e' lateral: 6.32
TDI e' medial: 4.12
VTI: 25.6 cm
Valve area: 3.1 cm2
Weight: 3680 oz

## 2016-12-26 LAB — RAPID URINE DRUG SCREEN, HOSP PERFORMED
AMPHETAMINES: NOT DETECTED
BENZODIAZEPINES: NOT DETECTED
Barbiturates: NOT DETECTED
COCAINE: NOT DETECTED
OPIATES: NOT DETECTED
Tetrahydrocannabinol: NOT DETECTED

## 2016-12-26 LAB — CBC
HCT: 36.3 % — ABNORMAL LOW (ref 39.0–52.0)
HEMOGLOBIN: 12.2 g/dL — AB (ref 13.0–17.0)
MCH: 27.4 pg (ref 26.0–34.0)
MCHC: 33.6 g/dL (ref 30.0–36.0)
MCV: 81.6 fL (ref 78.0–100.0)
Platelets: 182 10*3/uL (ref 150–400)
RBC: 4.45 MIL/uL (ref 4.22–5.81)
RDW: 15.4 % (ref 11.5–15.5)
WBC: 7.4 10*3/uL (ref 4.0–10.5)

## 2016-12-26 LAB — TSH: TSH: 1.731 u[IU]/mL (ref 0.350–4.500)

## 2016-12-26 LAB — LIPID PANEL
Cholesterol: 304 mg/dL — ABNORMAL HIGH (ref 0–200)
HDL: 38 mg/dL — ABNORMAL LOW (ref >40)
LDL CALC: 235 mg/dL — AB (ref 0–99)
TRIGLYCERIDES: 153 mg/dL — AB (ref <150)
Total CHOL/HDL Ratio: 8 RATIO
VLDL: 31 mg/dL (ref 0–40)

## 2016-12-26 MED ORDER — ASPIRIN EC 325 MG PO TBEC: 325.0000 mg | DELAYED_RELEASE_TABLET | Freq: Every day | ORAL | Status: DC

## 2016-12-26 MED ORDER — ATORVASTATIN CALCIUM 40 MG PO TABS: 40.0000 mg | ORAL_TABLET | Freq: Every day | ORAL | Status: DC

## 2016-12-26 MED ORDER — ASPIRIN EC 81 MG PO TBEC: 81.0000 mg | DELAYED_RELEASE_TABLET | Freq: Every day | ORAL | Status: DC

## 2016-12-26 MED ORDER — ASPIRIN 300 MG RE SUPP
300.0000 mg | Freq: Every day | RECTAL | Status: DC
  Administered 2016-12-26 – 2016-12-31 (×4): 300 mg via RECTAL
  Filled 2016-12-26 (×4): qty 1

## 2016-12-26 MED ORDER — PANTOPRAZOLE SODIUM 40 MG PO TBEC: 40.0000 mg | DELAYED_RELEASE_TABLET | Freq: Every day | ORAL | Status: DC

## 2016-12-26 MED ORDER — ENOXAPARIN SODIUM 40 MG/0.4ML ~~LOC~~ SOLN
40.0000 mg | SUBCUTANEOUS | Status: DC
  Administered 2016-12-26 – 2016-12-30 (×5): 40 mg via SUBCUTANEOUS
  Filled 2016-12-26 (×5): qty 0.4

## 2016-12-26 MED ORDER — ATORVASTATIN CALCIUM 80 MG PO TABS: 80.0000 mg | ORAL_TABLET | Freq: Every day | ORAL | Status: DC

## 2016-12-26 NOTE — Evaluation (Signed)
Physical Therapy Evaluation and Discharge Patient Details Name: [name] MRN: [id number] DOB: Feb 15, 1946 Today's Date: 12/26/2016   History of Present Illness  Pt is a 71 y/o male with a PMH significant for CVA, HTN, DVT, DMII, CKD 3, s/p pacemaker. Pt is from a SNF (been there for about a year per wife), and at baseline is non-ambulatory. Pt presents to the ED with R-sided gaze, L facial droop, and L sided weakness. CT revealed acute to early subacute right lateral lenticulostriate territory  Clinical Impression  Patient evaluated by Physical Therapy with no further acute PT needs identified. Wife present in room and states that PTA pt was dependent for all ADL's and was non-ambulatory. Staff was assisting pt OOB with a lift. Pt is appropriate for return to SNF at this time from a PT perspective. See below for any follow-up Physical Therapy or equipment needs. PT is signing off. Thank you for this referral.     Follow Up Recommendations SNF;Supervision/Assistance - 24 hour    Equipment Recommendations  None recommended by PT    Recommendations for Other Services       Precautions / Restrictions Precautions Precautions: Fall Restrictions Weight Bearing Restrictions: No      Mobility  Bed Mobility Overal bed mobility: Needs Assistance Bed Mobility: Supine to Sit;Sit to Supine     Supine to sit: Total assist;+2 for physical assistance Sit to supine: Total assist;+2 for physical assistance   General bed mobility comments: Total assist required for transition to/from EOB. Bed pad utilized for helicopter technique. Once trunk was flexed and pt in sitting position, he was able to sit EOB with occasional assistance (min guard to supervision for short bouts).   Transfers                 General transfer comment: Did not attempt. Pt is appropriate for use of lift with nursing staff for OOB at this time.   Ambulation/Gait             General Gait Details: Unable at  baseline.   Stairs            Wheelchair Mobility    Modified Rankin (Stroke Patients Only) Modified Rankin (Stroke Patients Only) Pre-Morbid Rankin Score: Severe disability Modified Rankin: Severe disability     Balance Overall balance assessment: Needs assistance Sitting-balance support: Feet supported;Bilateral upper extremity supported Sitting balance-Leahy Scale: Poor Sitting balance - Comments: Able to hold himself up without assist for short bouts at EOB however required UE support.  Postural control: Posterior lean;Right lateral lean                                   Pertinent Vitals/Pain Pain Assessment: No/denies pain    Home Living Family/patient expects to be discharged to:: Skilled nursing facility     Type of Home: Skilled Nursing Facility                Prior Function Level of Independence: Needs assistance   Gait / Transfers Assistance Needed: Per wife staff was getting him OOB with a lift. Not able to stand or walk for ~1 year.  ADL's / Homemaking Assistance Needed: Dependent for ADL's per wife.         Hand Dominance   Dominant Hand: Right    Extremity/Trunk Assessment   Upper Extremity Assessment Upper Extremity Assessment: Defer to OT evaluation    Lower Extremity Assessment  Lower Extremity Assessment: Generalized weakness (No active movement noted during session. )    Cervical / Trunk Assessment Cervical / Trunk Assessment: Other exceptions Cervical / Trunk Exceptions: Forward head/rounded shoulder posture  Communication   Communication: Expressive difficulties  Cognition Arousal/Alertness: Lethargic Behavior During Therapy: Flat affect Overall Cognitive Status: Impaired/Different from baseline Area of Impairment: Attention;Following commands;Awareness;Problem solving;Safety/judgement   Current Attention Level: Focused   Following Commands: Follows one step commands inconsistently;Follows one step commands  with increased time Safety/Judgement: Decreased awareness of safety;Decreased awareness of deficits Awareness: Intellectual Problem Solving: Slow processing;Decreased initiation;Difficulty sequencing;Requires verbal cues;Requires tactile cues General Comments: Noted L side inattention/neglect. Gaze fixed to the R side throughout most of session. Was able to follow therapist to midline but immediately looked R when therapist passed midline to the L side.     General Comments      Exercises     Assessment/Plan    PT Assessment Patent does not need any further PT services  PT Problem List Decreased strength;Decreased range of motion;Decreased activity tolerance;Decreased balance;Decreased mobility;Decreased cognition;Decreased safety awareness       PT Treatment Interventions      PT Goals (Current goals can be found in the Care Plan section)  Acute Rehab PT Goals Patient Stated Goal: Pt unable to participate in goal setting. PT Goal Formulation: All assessment and education complete, DC therapy    Frequency     Barriers to discharge        Co-evaluation               End of Session   Activity Tolerance: Patient limited by lethargy Patient left: in bed;with call bell/phone within reach;with bed alarm set;with family/visitor present Nurse Communication: Mobility status;Need for lift equipment PT Visit Diagnosis: Hemiplegia and hemiparesis Hemiplegia - Right/Left: Left Hemiplegia - dominant/non-dominant: Non-dominant Hemiplegia - caused by: Cerebral infarction         Time: 0842-0905 PT Time Calculation (min) (ACUTE ONLY): 23 min   Charges:   PT Evaluation $PT Eval High Complexity: 1 Procedure PT Treatments $Neuromuscular Re-education: 8-22 mins   PT G Codes:         Marylynn PearsonLaura D Shawne Bulow 12/26/2016, 10:53 AM   Conni SlipperLaura Benjamyn Hestand, PT, DPT Acute Rehabilitation Services Pager: [phone]

## 2016-12-26 NOTE — Evaluation (Signed)
Occupational Therapy Evaluation Patient Details Name: [name] MRN: [id number] DOB: 12-08-1945 Today's Date: 12/26/2016    History of Present Illness Pt is a 71 y/o male with a PMH significant for CVA, HTN, DVT, DMII, CKD 3, s/p pacemaker. Pt is from a SNF (been there for about a year per wife), and at baseline is non-ambulatory. Pt presents to the ED with R-sided gaze, L facial droop, and L sided weakness. CT revealed acute to early subacute right lateral lenticulostriate territory   Clinical Impression   This 71 yo male admitted with above presents to acute OT with deficits below (see OT problem list). He will benefit from family ed to wife for Bil UE positioning and vision education.    Follow Up Recommendations  SNF;Supervision/Assistance - 24 hour    Equipment Recommendations  None recommended by OT       Precautions / Restrictions Precautions Precautions: Fall Restrictions Weight Bearing Restrictions: No              ADL Overall ADL's : Needs assistance/impaired                                       General ADL Comments: total A pta     Vision Baseline Vision/History: Wears glasses Wears Glasses: Reading only Patient Visual Report:  (unable to state) Additional Comments: Pt unable to follow commands for offical testing. Pt overall did not turn his head or eyes to left except on 1 occassion spontaneously to slightly past midline (eyes and head)            Pertinent Vitals/Pain Pain Assessment: Faces Faces Pain Scale: Hurts little more Pain Location: when looking at PROM Bil UEs Pain Descriptors / Indicators: Grimacing Pain Intervention(s): Monitored during session;Repositioned     Hand Dominance Right   Extremity/Trunk Assessment Upper Extremity Assessment Upper Extremity Assessment: RUE deficits/detail;LUE deficits/detail RUE Deficits / Details: long standing CVA related deficits (decreased PROM all joints; unable to get wrist  neutral and unable to get digits fully extended without full wrist extension; mild contracture in right elbow for elbow extension with increased time to the most passive extension. Pt with wasting of web space. Wash cloth roll placed to help with hand contour and help prevent any further contractures. RUE Coordination: decreased fine motor;decreased gross motor LUE Deficits / Details: tendency for extensor tone with flattening of palm. Cannot get full extenson of digits with wrist flexion. Pt does move finger spontaneously at times. Wash cloth roll placed to help with hand contour and help decrease contractures. LUE Coordination: decreased fine motor;decreased gross motor     Communication Communication Communication: Expressive difficulties   Cognition Arousal/Alertness: Lethargic Behavior During Therapy: Flat affect Overall Cognitive Status: No family/caregiver present to determine baseline cognitive functioning Area of Impairment: Attention;Following commands;Awareness;Problem solving;Safety/judgement   Current Attention Level: Focused   Following Commands: Follows one step commands inconsistently;Follows one step commands with increased time Safety/Judgement: Decreased awareness of safety;Decreased awareness of deficits Awareness: Intellectual Problem Solving: Slow processing;Decreased initiation;Requires verbal cues;Requires tactile cues General Comments: Noted L side inattention/neglect. Gaze and head fixed to the R side throughout most of session; when A'd with turning his head to midline--pt then could not/would not open his eyes; however as we started to leave the room pt automatically turned his head slightly past midline to left and eyes as well with eyes wide open  Home Living Family/patient expects to be discharged to:: Skilled nursing facility     Type of Home: Skilled Nursing Facility                                  Prior  Functioning/Environment Level of Independence: Needs assistance  Gait / Transfers Assistance Needed: Per wife staff was getting him OOB with a lift. Not able to stand or walk for ~1 year. ADL's / Homemaking Assistance Needed: Dependent for ADL's per wife.             OT Problem List: Decreased strength;Decreased range of motion;Impaired UE functional use;Impaired sensation;Impaired vision/perception      OT Treatment/Interventions: Patient/family education;Visual/perceptual remediation/compensation;Splinting    OT Goals(Current goals can be found in the care plan section) Acute Rehab OT Goals Patient Stated Goal: Pt unable to participate in goal setting. OT Goal Formulation: Patient unable to participate in goal setting Time For Goal Achievement: 01/02/17 Potential to Achieve Goals: Good  OT Frequency: Min 2X/week              End of Session Nurse Communication:  (NT: repositioned him in bed)  Activity Tolerance: Patient tolerated treatment well Patient left: in bed;with bed alarm set  OT Visit Diagnosis: Hemiplegia and hemiparesis Hemiplegia - Right/Left:  (both) Hemiplegia - dominant/non-dominant:  (both) Hemiplegia - caused by: Cerebral infarction                ADL either performed or assessed with clinical judgement  Time: 1125-1156 OT Time Calculation (min): 31 min Charges:  OT General Charges $OT Visit: 1 Procedure OT Evaluation $OT Eval Moderate Complexity: 1 Procedure OT Treatments $Therapeutic Activity: 8-22 mins Ignacia Palma, OTR/L [phone] 12/26/2016

## 2016-12-26 NOTE — Progress Notes (Signed)
PHARMACY - PHYSICIAN COMMUNICATION CRITICAL VALUE ALERT - BLOOD CULTURE IDENTIFICATION (BCID)  Results for orders placed or performed during the hospital encounter of 12/25/16  Blood Culture ID Panel (Reflexed) (Collected: 12/25/2016 10:20 AM)  Result Value Ref Range   Enterococcus species NOT DETECTED NOT DETECTED   Listeria monocytogenes NOT DETECTED NOT DETECTED   Staphylococcus species DETECTED (A) NOT DETECTED   Staphylococcus aureus NOT DETECTED NOT DETECTED   Methicillin resistance NOT DETECTED NOT DETECTED   Streptococcus species NOT DETECTED NOT DETECTED   Streptococcus agalactiae NOT DETECTED NOT DETECTED   Streptococcus pneumoniae NOT DETECTED NOT DETECTED   Streptococcus pyogenes NOT DETECTED NOT DETECTED   Acinetobacter baumannii NOT DETECTED NOT DETECTED   Enterobacteriaceae species NOT DETECTED NOT DETECTED   Enterobacter cloacae complex NOT DETECTED NOT DETECTED   Escherichia coli NOT DETECTED NOT DETECTED   Klebsiella oxytoca NOT DETECTED NOT DETECTED   Klebsiella pneumoniae NOT DETECTED NOT DETECTED   Proteus species NOT DETECTED NOT DETECTED   Serratia marcescens NOT DETECTED NOT DETECTED   Haemophilus influenzae NOT DETECTED NOT DETECTED   Neisseria meningitidis NOT DETECTED NOT DETECTED   Pseudomonas aeruginosa NOT DETECTED NOT DETECTED   Candida albicans NOT DETECTED NOT DETECTED   Candida glabrata NOT DETECTED NOT DETECTED   Candida krusei NOT DETECTED NOT DETECTED   Candida parapsilosis NOT DETECTED NOT DETECTED   Candida tropicalis NOT DETECTED NOT DETECTED    Name of physician (or Provider) Contacted: ITMS  Changes to prescribed antibiotics required: none, as likely contaminant. Will follow up blood cx's and adjust abx therapy as needed.   Sheron NightingaleJames A Hubert Derstine 12/26/2016  9:56 AM

## 2016-12-26 NOTE — Progress Notes (Signed)
Subjective: [name] was seen and evaluated today at bedside. He denies any complaints overnight. Reports he is feeling alright. Wife is present at bedside who stayed overnight, she denies any acute events. Wife indicates that patient desires to return home eventually however that she does not have the resources necessary to provide the care that he needs at home. The wife mentioned that she was told that the patient has "syphillis in his blood." Her husband has never shared this information with her before. She is unsure if he has been treated.   Objective:  Vital signs in last 24 hours: Vitals:   12/26/16 0600 12/26/16 1015 12/26/16 1306 12/26/16 1413  BP: (!) 159/76 (!) 161/86 (!) 132/57   Pulse: 67 67 68   Resp:  16 (!) 163   Temp: 98.7 F (37.1 C) 98.3 F (36.8 C) 99.3 F (37.4 C)   TempSrc: Axillary Oral Oral   SpO2: 100% 96% 90%   Weight:    230 lb (104.3 kg)  Height:    6\' 3"  (1.905 m)   General: Chronically-ill appearing african [place] male resting comfortably in bed with eyes closed. In no acute distress, falls asleep when not spoken to. Wife present at bedside.  HENT: Normocephalic/atraumatic. Prominent left facial droop. Speech seems slurred however unclear if at baseline. Cardiovascular: Regular rate and rhythm. Systolic murmur appreciated.  Pulmonary: CTA BL on anterior chest, no wheezing, crackles or rhonchi appreciated. Unlabored breathing.  Abdomen: Soft, non-tender and non-distended. No guarding or rigidity. +bowel sounds.  Skin: Warm, dry. No cyanosis, suspicious rashes or lesions.  Psych: Mood normal and affect was mood congruent. Responds to questions appropriately.   Assessment/Plan:  Principal Problem:   Stroke (cerebrum) (HCC) Active Problems:   Diabetes mellitus type 2, uncontrolled (HCC)   Hypothyroidism   Essential hypertension   CKD (chronic kidney disease) stage 3, GFR 30-59 ml/min   Hypokalemia   DVT (deep venous thrombosis) (HCC)   Autoimmune  hepatitis (HCC)  Acute Ischemic Stroke: CT head with evidence of acute to early subacute right lateral lenticulostriate territory infarction. Vascular imaging with CTA (unable to obtain MRI as patient has pacer) show widespread intracranial atherosclerotic disease with multiple areas of stenosis, not felt to be amenable to surgery. He continues to have obvious left facial droop on examination. Did not pass swallow evaluation. Wife reports he has dysphagia at baseline. Overall, the prognosis is poor given his baseline. Wife reports he has signed a living will and has these papers at home, however does not recall his wishes. She ensures she will bring these papers tomorrow. Lipid panel resulting with significant hyperlipidemia.  -Start Atorvastatin 40 mg -Start ASA 81 mg (will also start PPI as patient has hx of gastritis) -PT/OT suggest SNF placement. He will most likely return to his prior SNF upon d/c  -Permissive hypertension  History of DVT: Has been on Eliquis however have changed therapy to Lovenox in setting of not passing swallow eval. Unclear if provoked, unprovoked, recurrent or if he actually needs lifelong anticoagulation. Chart review shows possible subsegmental PE. Following DVT study showed a non-occlusive DVT. He was inappropriately started on life-long anticoagulation with Eliquis. We will discontinue therapeutic Lovenox and instead start on standard prophylactic lovenox in addition to ASA 81 mg as believe lifelong anticoagulation poses more harm than benefit in this patient.  -D/c therapeutic lovenox -Start prophylactic lovenox -Start ASA 81 mg + PPI   ?Syphilis: Contacting the health department for more details.  DM2: Obtaining HbA1c. Continuing  Lantus 13 units   HTN: Holding home Amlodipine 10 mg and Toprol xl 100mg  in setting of acute CVA. Allowing permissive hypertension to <220/110  Dispo: Anticipated discharge in approximately 1-2 day(s).   Craig Wisnewski, DO 12/26/2016, 2:30  PM Pager: [phone]

## 2016-12-26 NOTE — Progress Notes (Signed)
Patient's wife at bedside, she reports that patient eats puree food at baseline. Also states that he needs lots of reminders to swallow his food. Patient is sleeping at this time with CPAP in place, no sign of pain/discomfort at this time-Will continue to monitor.

## 2016-12-26 NOTE — Evaluation (Signed)
Speech Language Pathology Evaluation Patient Details Name: [name] MRN: [id number] DOB: [birth year] Today's Date: 12/26/2016 Time: 7829-5621 SLP Time Calculation (min) (ACUTE ONLY): 10 min  Problem List:  Patient Active Problem List   Diagnosis Date Noted  . Stroke (cerebrum) (HCC) 12/25/2016  . Autoimmune hepatitis (HCC)   . C. difficile colitis 04/11/2015  . HCAP (healthcare-associated pneumonia) 04/11/2015  . Hypokalemia 04/11/2015  . DVT (deep venous thrombosis) (HCC)   . Elevated LFTs   . Acute encephalopathy   . Erosive gastritis   . Duodenitis   . Elevated liver function tests 04/03/2015  . Diarrhea 04/03/2015  . Weakness 04/03/2015  . Diabetes mellitus type 2, uncontrolled (HCC) 04/03/2015  . Hypothyroidism 04/03/2015  . Essential hypertension 04/03/2015  . Gallstones 04/03/2015  . CKD (chronic kidney disease) stage 3, GFR 30-59 ml/min 04/03/2015   Past Medical History:  Past Medical History:  Diagnosis Date  . Autoimmune hepatitis (HCC)   . C. difficile colitis   . Cirrhosis (HCC)   . Diabetes mellitus without complication (HCC)   . DVT (deep venous thrombosis) (HCC)   . Heart failure (HCC)   . Hypertension   . Hypothyroidism   . Presence of permanent cardiac pacemaker   . PTSD (post-traumatic stress disorder)   . Stroke [facility])    Past Surgical History:  Past Surgical History:  Procedure Laterality Date  . ESOPHAGOGASTRODUODENOSCOPY N/A 04/07/2015   Procedure: ESOPHAGOGASTRODUODENOSCOPY (EGD);  Surgeon: [name] Dare, MD;  Location: [facility];  Service: Endoscopy;  Laterality: N/A;  . INSERT / REPLACE / REMOVE PACEMAKER    . TONSILLECTOMY     HPI:  71 year old male admitted with left sided facial droop, right gaze preference. Diagnosed with acute right CVA. PMH of CVA, patient bedbound at SNF, answers simple questions by nodding, consumes pureed food at baseline.     Assessment / Plan / Recommendation Clinical Impression  Cognitive-linguistic  evaluation complete. Patient with severe lethargy impacting evaluation results today. Moderate-severe dysarthria noted but patient without evidence of dysphagia. Poor sustained attention and lethargy impacting ability to follow greater than 1-step commands. Patient oriented to self and place however required cues for orientation to time and situation. Will benefit from f/u SLP treatment to address deficits.     SLP Assessment  SLP Recommendation/Assessment: Patient needs continued Speech Lanaguage Pathology Services SLP Visit Diagnosis: Dysarthria and anarthria (R47.1)    Follow Up Recommendations  Skilled Nursing facility    Frequency and Duration min 2x/week  2 weeks      SLP Evaluation Cognition  Overall Cognitive Status: Impaired/Different from baseline Arousal/Alertness: Lethargic Orientation Level: Oriented to person;Oriented to place;Disoriented to time;Disoriented to situation Attention: Sustained Sustained Attention: Impaired Sustained Attention Impairment: Verbal basic;Functional basic Memory: Impaired Memory Impairment: Storage deficit Awareness: Impaired Awareness Impairment: Intellectual impairment Problem Solving: Impaired Problem Solving Impairment: Verbal basic;Functional basic       Comprehension  Auditory Comprehension Overall Auditory Comprehension: Impaired Yes/No Questions: Impaired Basic Biographical Questions: 76-100% accurate Basic Immediate Environment Questions: 75-100% accurate Complex Questions: 0-24% accurate Commands: Impaired One Step Basic Commands: 50-74% accurate Two Step Basic Commands: 0-24% accurate Conversation: Simple Interfering Components: Attention (lethargy) Visual Recognition/Discrimination Discrimination: Not tested Reading Comprehension Reading Status: Not tested    Expression Expression Primary Mode of Expression: Verbal Verbal Expression Overall Verbal Expression: Appears within functional limits for tasks  assessed Written Expression Dominant Hand: Right   Oral / Motor  Oral Motor/Sensory Function Overall Oral Motor/Sensory Function: Moderate impairment Facial ROM: Suspected CN  VII (facial) dysfunction;Reduced left Facial Symmetry: Abnormal symmetry left;Suspected CN VII (facial) dysfunction Facial Strength: Reduced left;Reduced right;Suspected CN VII (facial) dysfunction Facial Sensation: Within Functional Limits Lingual ROM: Within Functional Limits Lingual Symmetry: Within Functional Limits Lingual Strength: Reduced Lingual Sensation: Within Functional Limits Velum:  (NT) Mandible: Within Functional Limits Motor Speech Overall Motor Speech: Impaired Respiration: Within functional limits Phonation: Low vocal intensity;Normal Resonance: Within functional limits Articulation: Impaired Level of Impairment: Word Intelligibility: Intelligibility reduced Word: 25-49% accurate Phrase: 0-24% accurate Sentence: Not tested Conversation: Not tested Motor Planning: Witnin functional limits   GO            UnitedHealthLeah Roselinda Bahena MA, CCC-SLP [phone]        Kiowa Hollar [name] 12/26/2016, 10:30 AM

## 2016-12-26 NOTE — Plan of Care (Signed)
Problem: SLP Language Goals Goal: Patient will utilize speech intelligibility Patient will utilize speech intelligibility strategies to  enhance communication with Mod assist   

## 2016-12-26 NOTE — Progress Notes (Signed)
STROKE TEAM PROGRESS NOTE   HISTORY OF PRESENT ILLNESS (per record) [name] is an 71 y.o. male lives at a nursing home currently. Patient has had a stroke in the past. He currently is on anticoagulation secondary to DVT.  It is very unclear after calling the nursing home was exact last known normal was but apparently he was acting himself at approximate 6:00p on 12/24/2016 ([facility]). His baseline is wheelchair bound and bed bound. Patient does not talk but answers with single yes or no answers and nodding his head. Patient was noted this morning to be very abnormal as he was not communicating nor following commands thus EMS was called. Due to his previous stroke and abnormality of mental status he is brought in as a code stroke. CT of head was negative for any acute stroke or bleed. CT angiogram of head and neck were also obtained awaiting final reading. Patient was not a TPA candidate due to his anticoagulation and out of the window. He was not a intra-arterial candidate secondary to his significant debilitation at baseline.  He was admitted for further evaluation and treatment.   SUBJECTIVE (INTERVAL HISTORY) No family at bedside. Just finished 2D echo test. He has severe dysarthria and right gaze and right facial droop as well as RUE weakness. Concerning for right MCA cortical infarct. Repeat CT pending. He is on eliquis but under-dosed.    OBJECTIVE Temp:  [97.7 F (36.5 C)-98.7 F (37.1 C)] 98.3 F (36.8 C) (03/01 1015) Pulse Rate:  [59-67] 67 (03/01 1015) Cardiac Rhythm: A-V Sequential paced (03/01 0700) Resp:  [13-27] 16 (03/01 1015) BP: (143-169)/(72-98) 161/86 (03/01 1015) SpO2:  [95 %-100 %] 96 % (03/01 1015) Weight:  [104.3 kg (230 lb)] 104.3 kg (230 lb) (02/28 1053)  CBC:  Recent Labs Lab 12/25/16 0957 12/25/16 1004 12/26/16 0235  WBC 7.5  --  7.4  NEUTROABS 2.8  --   --   HGB 11.6* 11.2* 12.2*  HCT 34.8* 33.0* 36.3*  MCV 81.9  --  81.6  PLT 148*  --  182    Basic  Metabolic Panel:  Recent Labs Lab 12/25/16 0957 12/25/16 1004  NA 139 138  K 3.5 3.4*  CL 104 103  CO2 28  --   GLUCOSE 92 88  BUN 16 19  CREATININE 1.29* 1.40*  CALCIUM 9.5  --     Lipid Panel:    Component Value Date/Time   CHOL 304 (H) 12/26/2016 0235   TRIG 153 (H) 12/26/2016 0235   HDL 38 (L) 12/26/2016 0235   CHOLHDL 8.0 12/26/2016 0235   VLDL 31 12/26/2016 0235   LDLCALC 235 (H) 12/26/2016 0235   HgbA1c:  Lab Results  Component Value Date   HGBA1C 7.5 (H) 04/12/2015   Urine Drug Screen:    Component Value Date/Time   LABOPIA NONE DETECTED 12/26/2016 0544   COCAINSCRNUR NONE DETECTED 12/26/2016 0544   COCAINSCRNUR NONE DETECTED 07/02/2016 2055   COCAINSCRNUR NEGATIVE 02/05/2008 2204   LABBENZ NONE DETECTED 12/26/2016 0544   LABBENZ NEGATIVE 02/05/2008 2204   AMPHETMU NONE DETECTED 12/26/2016 0544   THCU NONE DETECTED 12/26/2016 0544   LABBARB NONE DETECTED 12/26/2016 0544      IMAGING I have personally reviewed the radiological images below and agree with the radiology interpretations.  Ct Head Code Stroke W/o Cm 12/25/2016 1. Acute to early subacute right lateral lenticulostriate territory infarct. No acute hemorrhage. 2. ASPECTS is 7. 3. Moderate chronic small vessel ischemic disease and cerebral atrophy.  CTA neck 12/25/2016 No significant finding.  Vessels widely patent.   CTA head 12/25/2016 Widespread intracranial atherosclerotic disease. Supraclinoid ICA stenoses, 50% on the right and 70% on the left. Atherosclerotic narrowing of the M1 segments bilaterally, 30% on the right and 50% on the left. Into stenoses in distal vessel atherosclerotic narrowing and irregularity diffusely. Atherosclerosis of the distal vertebral and basilar artery but without flow limiting stenosis. Atherosclerotic disease in the PCA territories bilaterally with focal stenoses 70% or greater on each side. Distal vessel atherosclerotic irregularity. Perfusion study shows  artifactual T-max data. I do not believe there is any significant at risk brain. One can appreciate some diminished CBF and elongated T-max in the region of right external capsule and radiating white matter tract infarctions.   Dg Chest Portable 1 View 12/25/2016 Stable cardiomegaly with permanent pacemaker. No active lung disease.   2-D echocardiogram Left ventricle: The cavity size was normal. Wall thickness was   increased in a pattern of severe LVH. Systolic function was   normal. The estimated ejection fraction was in the range of 55%   to 60%. Wall motion was normal; there were no regional wall   motion abnormalities. Doppler parameters are consistent with   abnormal left ventricular relaxation (grade 1 diastolic   dysfunction). - Left atrium: The atrium was mildly dilated. - Right ventricle: Pacer wire or catheter noted in right ventricle. Impressions: - No cardiac source of emboli was indentified.  CT repeat pending   PHYSICAL EXAM  Temp:  [97.7 F (36.5 C)-99.3 F (37.4 C)] 99.3 F (37.4 C) (03/01 1306) Pulse Rate:  [59-68] 68 (03/01 1306) Resp:  [16-163] 163 (03/01 1306) BP: (132-169)/(57-86) 132/57 (03/01 1306) SpO2:  [90 %-100 %] 90 % (03/01 1306) Weight:  [230 lb (104.3 kg)] 230 lb (104.3 kg) (03/01 1413)  General - obese, well developed, lethargic.  Ophthalmologic - Fundi not visualized due to .  Cardiovascular - Regular rate and rhythm.  Mental Status -  Level of arousal and orientation to self, place were intact, but not orientated to time, age, and people. Severe dysarthria, seems able to repeat simple sentences, able to have spontaneous speech output but severe dysarthria and intelligible, following simple commands, but difficulty to name. Right gaze with left neglect.  Cranial Nerves II - XII - II - not blinking to visual threat on the left. III, IV, VI - right gaze preference, barely cross midline. V - Facial sensation intact bilaterally. VII - left  facial droop. VIII - Hearing & vestibular intact bilaterally. X - Palate elevation not able to test, however, severe dysarthria. XI - Chin turning & shoulder shrug intact bilaterally. XII - Tongue protrusion intact.  Motor Strength - The patient's strength was 4+/5 RUE proximal, 3/5 LUE proximal, however, distally b/l wrist and fingers contracture. Mild withdraw BLEs on pain stimulation.  Bulk was normal and fasciculations were absent.   Motor Tone - Muscle tone was assessed at the neck and appendages and was increased throughout with b/l hand/wrist.  Reflexes - The patient's reflexes were 1+ in all extremities and he had no pathological reflexes.  Sensory - not cooperative on exam    Coordination - not cooperative on exam    Gait and Station - not able to test.   ASSESSMENT/PLAN [name] is a 71 y.o. male with history of HTN, CVA, hypothyroidism, DVT on Eliquis, DM II, CKD 3,  LVH S/p pacemaker, who is bed bound in the nursing home presenting with right gaze  preference, left facial droop and left-sided weakness. He did not receive IV t-PA due to delay in arrival.   Stroke:  right MCA subcortical infarcts on CT head, however, not able to explain the right gaze and left neglect, will need to repeat CT head.    Resultant  Right gaze, left neglect, left hemiparesis  CT head R lateral lenticulostriate infarct. Aspects 7   CTA head widespread intracranial stenosis (R ICA 50%, L ICA 70%, L M1 50%, B PCA 70%)  CTA neck Unremarkable   Repeat CT head pending  2D Echo  EF 55-60%  LDL 235  HgbA1c pending  Lovenox 40 mg sq daily for VTE prophylaxis  Diet NPO time specified  Eliquis (apixaban) daily prior to admission, now on ASA 300mg  PR. However, eliquis was underdosed. He should be on eliquis 5mg  bid. However, will repeat CT head first, if large stroke, will wait to restart eliquis for 3-5 days but if no large infarct, will resume eliquis but 5mg  bid.  Ongoing aggressive  stroke risk factor management  Therapy recommendations:  SNF return to SNF  Disposition:  Pending  Chronic anticoagulation  Pt was on eliquis 2.5mg  bid at SNF  Seems for his DVT prevention, but details not known  However, it was under dosed  Will increase to 5mg  bid once able to restart  Hypertension  Stable  Permissive hypertension (OK if < 180/105) but gradually normalize in 5-7 days  Long-term BP goal normotensive  Hyperlipidemia  Home meds:  No statin  LDL 235, goal < 70  Add lipitor 80 once passed swallow  Continue statin at discharge  Other Stroke Risk Factors  Advanced age  Former Cigarette smoker  CPAP use likely for obstructive sleep apnea   Other Active Problems  DVT on Eliquis, ? Age or recurrence  Hypothyroidism  PTSD  COPD  Hospital day # 1  Marvel Plan, MD PhD Stroke Neurology 12/26/2016 4:27 PM    To contact Stroke Continuity provider, please refer to [email or web address]. After hours, contact General Neurology

## 2016-12-26 NOTE — Progress Notes (Signed)
Initial Nutrition Assessment  DOCUMENTATION CODES:   Non-severe (moderate) malnutrition in context of chronic illness  INTERVENTION:  -Monitor goals of care and nutritional needs (TF vs po Supplements)  NUTRITION DIAGNOSIS:   Malnutrition related to chronic illness as evidenced by severe depletion of muscle mass, moderate depletion of body fat.  GOAL:   Patient will meet greater than or equal to 90% of their needs  MONITOR:   PO intake, I & O's, Weight trends  REASON FOR ASSESSMENT:   Low Braden    ASSESSMENT:   Pt with a PMH significant for CVA, HTN, DVT, DMII, CKD 3, s/p pacemaker, from a SNF [facility] and non-ambulatory at baseline, presenting with a stroke.   No historians at bedside and pt was non-communitative at time of visit therefore no history provided.  Per pt's SNF chart pt not on nutritional supplements PTA  Pt has no meal completion percentages recorded for current admission.  Per chart review, pt has had a pretty stable weight ranging from 230-240 lbs since 05/2016.   Labs and medications reviewed  Nutrition-focused physical exam completed. Findings are moderate fat depletion, moderate to severe muscle depletion and no edema.   Severe depletion in the legs could be attributed to pt's non-ambulatory status at baseline.  Diet Order:  Diet NPO time specified  Skin:  Reviewed, no issues  Last BM:  no BM date recorded  Height:   Ht Readings from Last 1 Encounters:  12/26/16 6\' 3"  (1.905 m)    Weight:   Wt Readings from Last 1 Encounters:  12/26/16 230 lb (104.3 kg)    Ideal Body Weight:  89 kg  BMI:  Body mass index is 28.75 kg/m.  Estimated Nutritional Needs:   Kcal:  2200-2400   Protein:  110-120 grams  Fluid:  >/= 2 L/d  EDUCATION NEEDS:   Education needs no appropriate at this time  [employer] Dietetic Intern

## 2016-12-26 NOTE — H&P (Signed)
Internal Medicine Attending Admission Note  I saw and evaluated the patient. I reviewed the resident's note and I agree with the resident's findings and plan as documented in the resident's note.  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 - Patient has had a history of multiple strokes, unfortunately he is not on optimum medical treatment due to other co morbidities.  However I think that we can rectify this. - His ASA was previously discontinued due to erosive esophagitis and duodenitis.  He was ultimately treated with a H2 blocker due to C diff.  However his C diff I now treated and he likely would benefit more from a PPI (I am also of the understanding that PPI increase risk for developing c diff but do not necessarily worsen clinical course).  Will discharge with 81mg  of Asprin daily - His Statin medication was previously discontinued due to Autoimmune hepatitis, there was previously a plan for treatment of this by his [place] physician after resolution of C diff, his LFTs appear to have normalized and I think he would receive more benefit from resumption of a high intensity statin.    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  It appears this DVT history started with an admission back in April of 2009 for syncope.  A CTA to evaluate for PE was obtained, it was a degraded exam that brought up the possibility of a subsegmental PE (surly not the cause of syncope) and to follow that up  lower extremity dopplers were obtained that showed a right non occlusive peroneal DVT.  Initially this was not planned to be treated however eventually he was treated with unfractionated heparin, at some point in 2012 this was changed to 5mg  BID of eliquis.  I am not sure exactly when this was changed to 2.5mg  of eliquis BID.  None the less it appears that he had an asymptomatic non occlusive distal DVT, given concerns for GI bleeding I do NOT think this needs to be treated with LIFELONG anticoagulation.  With his multiple strokes he would benefit much more from discontinuation of eliquis and resumption of antiplatelet therapy.    Autoimmune hepatitis (HCC) - LFTs appear stable.  Possible history of syphilis - Patient wife informed us today that a doctor has previously told her that he may have syphilis but did not start him on treatment, this would be very odd.  Will try to obtain records for health department and send serologies.  Of note I have removed "Heart Failure" from his EHR history, there is no confirmation of this diagnosis and he has had normal heart function on previous echocardiograms.   Discussed patient's care with his wife at bedside, he has an advanced directive, she cannot remember the details but will try to bring in today.    Chief Complaint(s): weakness  History - key components related to admission:  Briefly Dewane Timson his 71 year old male with past medical history of multiple CVAs, hypothyroidism, distal DVT  currently on Eliquis, uncontrolled type 2 diabetes, CK D stage III, pacemaker in place, history of C. difficile colitis who presents to us from a skilled nursing facility where he has been for the last year. At the nursing facility he was noted to have right-sided gaze, left facial droop, left-sided weakness they were concerned for stroke and thus EMS was called. In the ED a code stroke was called and he was evaluated by neurology. He was deemed not to be a TPA  candidate due to his anticoagulation as well as being outside in the wind. He is not considered to be an intra-arterial candidate due to his significant debilitation at baseline. History from the patient is limited due to dysarthria and hypophonation.  Lab results: Reviewed in Epic  Physical Exam - key components related to admission: General: resting in bed, hypophonic HEENT: PERRL, EOMI, no scleral icterus Cardiac: RRR, systolic murmur Pulm: clear to auscultation bilaterally, moving normal volumes of air Abd: soft, nontender, nondistended, BS present Ext: warm and well perfused, no pedal edema Neuro: left facial droop, eyes deviated right, opens eyes to voice, follows simple commands, able to move all 4 extremities. 4/5 left upper and lower extremity strength  Vitals:   12/26/16 0600 12/26/16 1015 12/26/16 1306 12/26/16 1413  BP: (!) 159/76 (!) 161/86 (!) 132/57   Pulse: 67 67 68   Resp:  16 (!) 163   Temp: 98.7 F (37.1 C) 98.3 F (36.8 C) 99.3 F (37.4 C)   TempSrc: Axillary Oral Oral   SpO2: 100% 96% 90%   Weight:    230 lb (104.3 kg)  Height:    6\' 3"  (1.905 m)

## 2016-12-26 NOTE — Evaluation (Signed)
Clinical/Bedside Swallow Evaluation Patient Details  Name: [name] MRN: [id number] Date of Birth: [birth year]  Today's Date: 12/26/2016 Time: SLP Start Time (ACUTE ONLY): 0920 SLP Stop Time (ACUTE ONLY): 0935 SLP Time Calculation (min) (ACUTE ONLY): 15 min  Past Medical History:  Past Medical History:  Diagnosis Date  . Autoimmune hepatitis (HCC)   . C. difficile colitis   . Cirrhosis (HCC)   . Diabetes mellitus without complication (HCC)   . DVT (deep venous thrombosis) (HCC)   . Heart failure (HCC)   . Hypertension   . Hypothyroidism   . Presence of permanent cardiac pacemaker   . PTSD (post-traumatic stress disorder)   . Stroke [facility])    Past Surgical History:  Past Surgical History:  Procedure Laterality Date  . ESOPHAGOGASTRODUODENOSCOPY N/A 04/07/2015   Procedure: ESOPHAGOGASTRODUODENOSCOPY (EGD);  Surgeon: [name] Dare, MD;  Location: [facility];  Service: Endoscopy;  Laterality: N/A;  . INSERT / REPLACE / REMOVE PACEMAKER    . TONSILLECTOMY     HPI:  71 year old male admitted with left sided facial droop, right gaze preference. Diagnosed with acute right CVA. PMH of CVA, patient bedbound at SNF, answers simple questions by nodding, consumes pureed food at baseline.     Assessment / Plan / Recommendation Clinical Impression  Patient presents with signs of a moderate oropharyngeal dysphagia, exacerbated by AMS. Patient lethargic requiring constant cueing to maintain adequate level of alertness for evaluation. Po provided and patient with severe oral holding, delayed oral transit requiring max SLP cueing for initiation of swallow. Per wife, patient requires cues at baseline to initiate swallow however worse during today's study with lethargy. Vocal quality wet post intake of liquids suggestive of decreased airway protection. Aspiration risk high at this time however prognosis for ability to resume pos good with improved mentation/alertness. Will f/u.  SLP Visit  Diagnosis: Dysphagia, oropharyngeal phase (R13.12)    Aspiration Risk  Severe aspiration risk    Diet Recommendation NPO   Medication Administration: Via alternative means    Other  Recommendations Oral Care Recommendations: Oral care QID   Follow up Recommendations Skilled Nursing facility      Frequency and Duration min 2x/week  2 weeks       Prognosis Prognosis for Safe Diet Advancement: Good Barriers to Reach Goals: Cognitive deficits      Swallow Study   General HPI: 71 year old male admitted with left sided facial droop, right gaze preference. Diagnosed with acute right CVA. PMH of CVA, patient bedbound at SNF, answers simple questions by nodding, consumes pureed food at baseline.   Type of Study: Bedside Swallow Evaluation Previous Swallow Assessment: none noted Diet Prior to this Study: NPO Temperature Spikes Noted: No Respiratory Status: Room air History of Recent Intubation: No Behavior/Cognition: Lethargic/Drowsy;Requires cueing Oral Cavity Assessment: Other (comment) (thick lingual secretions) Oral Care Completed by SLP: Yes Oral Cavity - Dentition: Dentures, not available (only top dentures available ) Vision:  (TBD) Self-Feeding Abilities: Needs assist (assisted with pos prior to admission) Patient Positioning: Upright in bed Baseline Vocal Quality: Normal;Low vocal intensity Volitional Cough: Weak Volitional Swallow: Able to elicit    Oral/Motor/Sensory Function Overall Oral Motor/Sensory Function: Moderate impairment Facial ROM: Suspected CN VII (facial) dysfunction;Reduced left Facial Symmetry: Abnormal symmetry left;Suspected CN VII (facial) dysfunction Facial Strength: Reduced left;Reduced right;Suspected CN VII (facial) dysfunction Facial Sensation: Within Functional Limits Lingual ROM: Within Functional Limits Lingual Symmetry: Within Functional Limits Lingual Strength: Reduced Lingual Sensation: Within Functional Limits Velum:  (  NT) Mandible:  Within Functional Limits   Ice Chips Ice chips: Impaired Presentation: Spoon Oral Phase Impairments: Impaired mastication Oral Phase Functional Implications: Prolonged oral transit;Oral holding   Thin Liquid Thin Liquid: Impaired Presentation: Cup;Spoon Oral Phase Impairments: Reduced labial seal;Impaired mastication Oral Phase Functional Implications: Right anterior spillage;Left anterior spillage;Prolonged oral transit;Oral holding Pharyngeal  Phase Impairments: Suspected delayed Swallow;Wet Vocal Quality    Nectar Thick Nectar Thick Liquid: Not tested   Honey Thick Honey Thick Liquid: Not tested   Puree Puree: Impaired Presentation: Spoon Oral Phase Impairments: Impaired mastication Oral Phase Functional Implications: Prolonged oral transit;Oral holding   Solid   GO   Solid: Not tested       [name] MA, CCC-SLP [phone]  [name] [name] 12/26/2016,10:25 AM

## 2016-12-27 DIAGNOSIS — I63311 Cerebral infarction due to thrombosis of right middle cerebral artery: Secondary | ICD-10-CM

## 2016-12-27 DIAGNOSIS — I5189 Other ill-defined heart diseases: Secondary | ICD-10-CM | POA: Diagnosis present

## 2016-12-27 DIAGNOSIS — N183 Chronic kidney disease, stage 3 (moderate): Secondary | ICD-10-CM

## 2016-12-27 DIAGNOSIS — E119 Type 2 diabetes mellitus without complications: Secondary | ICD-10-CM

## 2016-12-27 DIAGNOSIS — I519 Heart disease, unspecified: Secondary | ICD-10-CM

## 2016-12-27 DIAGNOSIS — E039 Hypothyroidism, unspecified: Secondary | ICD-10-CM

## 2016-12-27 DIAGNOSIS — R1312 Dysphagia, oropharyngeal phase: Secondary | ICD-10-CM

## 2016-12-27 LAB — GLUCOSE, CAPILLARY
GLUCOSE-CAPILLARY: 111 mg/dL — AB (ref 65–99)
GLUCOSE-CAPILLARY: 117 mg/dL — AB (ref 65–99)
Glucose-Capillary: 128 mg/dL — ABNORMAL HIGH (ref 65–99)
Glucose-Capillary: 116 mg/dL — ABNORMAL HIGH (ref 65–99)
Glucose-Capillary: 125 mg/dL — ABNORMAL HIGH (ref 65–99)

## 2016-12-27 LAB — CULTURE, BLOOD (ROUTINE X 2)

## 2016-12-27 LAB — RPR: RPR Ser Ql: NONREACTIVE

## 2016-12-27 LAB — HEMOGLOBIN A1C
HEMOGLOBIN A1C: 7 % — AB (ref 4.8–5.6)
MEAN PLASMA GLUCOSE: 154 mg/dL

## 2016-12-27 NOTE — Progress Notes (Signed)
Internal Medicine Attending:   I saw and examined the patient. I reviewed the resident's note and I agree with the resident's findings and plan as documented in the resident's note. Would reduce Asprin to 81mg  given history of gastritis. As discussed in my note yesterday and again in Dr Lana FishMolt's note, he has a history of distal DVT likely unprovoked.  Completing 6 months of treatment for this was likely sufficient.  However the 2.5mg  BID dosing of Eliquis may not have been subtherapeutic if it was for DVT dosing as the AMPLIFY-EXT trial showed that treatment beyond 6 months with this lower dose of eliquis is acceptable and still had equal risk reducation.  HOWEVER due to his history of gastritis he is at a higher risk of GI bleeding with ASA and A/C together, he does not have a strong indication for A/C so I would favor Eliquis be discontinued in favor of antiplatelet therapy.

## 2016-12-27 NOTE — Progress Notes (Signed)
  Speech Language Pathology Treatment: Dysphagia  Patient Details Name: [name] MRN: [id number] DOB: 1946-04-24 Today's Date: 12/27/2016 Time: 0940-1005 SLP Time Calculation (min) (ACUTE ONLY): 25 min  Assessment / Plan / Recommendation Clinical Impression  Poorly alert, requiring max tactile/verbal cues to maintain arousal.  Oral care provided; [place] assembled.  Pt actively anticipated/masticated ice chips, followed by oral holding of ice chips and tspns of water.  Eventual swallow was followed by delayed coughing.  Minimal verbal output (said "ok" and "yes").  Pt clearly not ready for POs.  SLP will follow next date for improvements.     HPI HPI: 71 year old male admitted with left sided facial droop, right gaze preference. Diagnosed with acute right CVA. PMH of CVA, patient bedbound at SNF, answers simple questions by nodding, consumes pureed food at baseline.        SLP Plan  Continue with current plan of care       Recommendations  Diet recommendations: NPO                Oral Care Recommendations: Oral care QID SLP Visit Diagnosis: Dysphagia, unspecified (R13.10) Plan: Continue with current plan of care       GO                Blenda MountsCouture, Tameika Heckmann Laurice 12/27/2016, 10:08 AM

## 2016-12-27 NOTE — Progress Notes (Signed)
Patient's wife gave staff a copy of Living Will. Please see chart.

## 2016-12-27 NOTE — Progress Notes (Signed)
   Subjective: [name] was seen and evaluated today at bedside. SLP, Sister and niece present. Patient not particularly interactive however denies any complaints, pain or events overnight. Sister and niece concerned over patients status and provided history that strokes are very common in their family. Inquired about extent of stroke involvement.   Objective:  Vital signs in last 24 hours: Vitals:   12/26/16 2105 12/27/16 0140 12/27/16 0459 12/27/16 0853  BP: (!) 145/67 140/76 (!) 152/74 (!) 119/53  Pulse: (!) 59 61 (!) 59 64  Resp: 16 16 18 20   Temp: 98.9 F (37.2 C) 98.3 F (36.8 C) 98.1 F (36.7 C) 97.9 F (36.6 C)  TempSrc: Axillary Axillary Axillary Axillary  SpO2: 100% 100% 97% 98%  Weight:      Height:       General: Chronically-ill appearing african [place] male resting comfortably in bed with eyes closed. In no acute distress. Lethargic however arousable to verbal stimuli. Indicates yes/no when asked questions. HENT: Normocephalic/atraumatic. Prominent left facial droop. Speech slurred. Cardiovascular: Regular rate and rhythm. Systolic murmur.  Pulmonary: CTA BL on anterior chest. Unlabored breathing.  Abdomen: Soft, non-tender and non-distended. +bowel sounds.  Skin: Warm, dry. No cyanosis.  Assessment/Plan:  Principal Problem:   Stroke (cerebrum) (HCC) Active Problems:   Diabetes mellitus type 2, uncontrolled (HCC)   Hypothyroidism   Essential hypertension   CKD (chronic kidney disease) stage 3, GFR 30-59 ml/min   Hypokalemia   History of deep venous thrombosis (DVT) of distal vein of right lower extremity   Autoimmune hepatitis (HCC)   Malnutrition of moderate degree   Left-sided weakness   Hyperlipidemia  Acute Ischemic Stroke: Repeat CT head obtained yesterday showed stability of right lentiform and surrounding structures. This did not show hemorrhagic conversion. Patient continues to have prominent left-sided facial droop on examination. He was evaluated  by SLP today who note that the patient is not currently ready for POs. They will remain on board and evaluate for improvements. He does have a degree of dysphagia at baseline, unclear if this has progressed. Overall prognosis remains poor given his debilitation at baseline.  -Wife to bring in living will papers -Atorvastatin 80 mg -ASA 325 mg (also started PPI yesterday due to pts hx of gastritis) -PT/OT Rec SNF placement -Continuing permissive hypertension  History of DVT: Has been on Eliquis for many years. Chart review showed hx of a possible subsegmental PE with a non-occlusive DVT. He was started then on life-long anticoagulation. Patient doesn't have a clear indication for lifelong anticoagulation and believe this would pose more harm than benefit in this patient. Would prefer instead antiplatelet therapy with ASA. This will be discussed with neurology.  -Lovenox -ASA + PPI   ?Syphilis: RPR NEGATIVE. Unclear why wife was told he has syphilis "in his blood for many years"   DM2: HbA1c 7.0%. Continuing Lantus 13 units and sliding scale. CBGs ~110-120 on average  HTN: Holding home Amlodipine 10 mg and Toprol xl 100mg  in setting of acute CVA. Allowing permissive hypertension to <220/110  Dispo: Anticipated discharge to SNF in approximately 1-2 day(s).   Keith Route, DO 12/27/2016, 12:11 PM Pager: [phone]

## 2016-12-27 NOTE — Progress Notes (Signed)
Pt placed on CPAP 10cmH20, 21%. Pt tolerating well at this time. RT will continue to monitor.

## 2016-12-27 NOTE — Progress Notes (Signed)
Occupational Therapy Treatment Patient Details Name: [name] MRN: [id number] DOB: [birth year] Today's Date: 12/27/2016    History of present illness Pt is a 71 y/o male with a PMH significant for CVA, HTN, DVT, DMII, CKD 3, s/p pacemaker. Pt is from a SNF (been there for about a year per wife), and at baseline is non-ambulatory. Pt presents to the ED with R-sided gaze, L facial droop, and L sided weakness. CT revealed acute to early subacute right lateral lenticulostriate territory   OT comments  Pt's wife present for education on palm protector and positioning. Pt's hands cleaned, anit-fungal powder applied, and palm protector put in place. Palm protector to prevent further contracture of left hand. Pt less responsive today only opening eyes once, with head turned right towards wife and MD having conversation. Pt continues to benefit from skilled OT in the acute care setting to reinforce family education, and improve gaze preference. Pt continues to require SNF level therapy upon discharge.   Follow Up Recommendations  SNF;Supervision/Assistance - 24 hour    Equipment Recommendations  None recommended by OT    Recommendations for Other Services      Precautions / Restrictions Precautions Precautions: Fall Restrictions Weight Bearing Restrictions: No       Mobility Bed Mobility               General bed mobility comments: Pt's wife educated in positioning, proping arms up on pillows, and supporting right side so Pt is at midline  Transfers                      Balance                                   ADL Overall ADL's : Needs assistance/impaired     Grooming: Wash/dry hands;Total assistance;Bed level Grooming Details (indicate cue type and reason): Pt's hands washed and anti-fungal podwer applied, washcloth roll in Right hand, palm protector applied in left hand. Wife educated in hand maintenance, and palm protector use.                                       Vision                     Perception     Praxis      Cognition   Behavior During Therapy: Flat affect   Area of Impairment: Attention;Following commands;Awareness;Problem solving;Safety/judgement   Current Attention Level: Focused    Following Commands: Follows one step commands inconsistently Safety/Judgement: Decreased awareness of safety;Decreased awareness of deficits Awareness: Intellectual Problem Solving: Slow processing;Decreased initiation;Requires verbal cues;Requires tactile cues General Comments: decreased arousal this session from yesterday. Pt only opened his eyes one time when he heard his wife and MD talking on the right side.      Exercises     Shoulder Instructions       General Comments      Pertinent Vitals/ Pain       Pain Assessment: Faces Faces Pain Scale: No hurt  Home Living                                          Prior Functioning/Environment  Frequency  Min 2X/week        Progress Toward Goals  OT Goals(current goals can now be found in the care plan section)  Progress towards OT goals: Progressing toward goals  Acute Rehab OT Goals Patient Stated Goal: Pt unable to participate in goal setting. OT Goal Formulation: Patient unable to participate in goal setting Time For Goal Achievement: 01/02/17 Potential to Achieve Goals: Good  Plan Discharge plan remains appropriate    Co-evaluation                 End of Session    OT Visit Diagnosis: Hemiplegia and hemiparesis Hemiplegia - caused by: Cerebral infarction   Activity Tolerance Patient tolerated treatment well   Patient Left in bed;with bed alarm set   Nurse Communication Mobility status;Other (comment) (new palm protector safe at all times, monitor web space once a shift)        Time: 0981-1914 OT Time Calculation (min): 33 min  Charges: OT General Charges $OT Visit: 1  Procedure OT Treatments $Self Care/Home Management : 23-37 mins  Sherryl Manges OTR/L [phone]  Evern [employer] Coral Soler 12/27/2016, 2:50 PM

## 2016-12-27 NOTE — Progress Notes (Signed)
STROKE TEAM PROGRESS NOTE   SUBJECTIVE (INTERVAL HISTORY) Wife is at bedside. He still lethargic and severe dysarthric. PT/OT is working with him. Wife stated that pt had neurologist in [place] and saw him 12/09/16 and told her that his blood syphilis test was positive. However, RPR here is negative. Will recommend to get conformational test for syphilis.    OBJECTIVE Temp:  [97.9 F (36.6 C)-98.9 F (37.2 C)] 98.3 F (36.8 C) (03/02 1255) Pulse Rate:  [59-68] 68 (03/02 1255) Cardiac Rhythm: A-V Sequential paced (03/02 0718) Resp:  [16-20] 18 (03/02 1255) BP: (115-152)/(33-76) 137/33 (03/02 1255) SpO2:  [96 %-100 %] 96 % (03/02 1255)  CBC:   Recent Labs Lab 12/25/16 0957 12/25/16 1004 12/26/16 0235  WBC 7.5  --  7.4  NEUTROABS 2.8  --   --   HGB 11.6* 11.2* 12.2*  HCT 34.8* 33.0* 36.3*  MCV 81.9  --  81.6  PLT 148*  --  182    Basic Metabolic Panel:   Recent Labs Lab 12/25/16 0957 12/25/16 1004  NA 139 138  K 3.5 3.4*  CL 104 103  CO2 28  --   GLUCOSE 92 88  BUN 16 19  CREATININE 1.29* 1.40*  CALCIUM 9.5  --     Lipid Panel:     Component Value Date/Time   CHOL 304 (H) 12/26/2016 0235   TRIG 153 (H) 12/26/2016 0235   HDL 38 (L) 12/26/2016 0235   CHOLHDL 8.0 12/26/2016 0235   VLDL 31 12/26/2016 0235   LDLCALC 235 (H) 12/26/2016 0235   HgbA1c:  Lab Results  Component Value Date   HGBA1C 7.0 (H) 12/26/2016   Urine Drug Screen:     Component Value Date/Time   LABOPIA NONE DETECTED 12/26/2016 0544   COCAINSCRNUR NONE DETECTED 12/26/2016 0544   COCAINSCRNUR NONE DETECTED 07/02/2016 2055   COCAINSCRNUR NEGATIVE 02/05/2008 2204   LABBENZ NONE DETECTED 12/26/2016 0544   LABBENZ NEGATIVE 02/05/2008 2204   AMPHETMU NONE DETECTED 12/26/2016 0544   THCU NONE DETECTED 12/26/2016 0544   LABBARB NONE DETECTED 12/26/2016 0544      IMAGING I have personally reviewed the radiological images below and agree with the radiology interpretations.  Ct Head Code  Stroke W/o Cm 12/25/2016 1. Acute to early subacute right lateral lenticulostriate territory infarct. No acute hemorrhage. 2. ASPECTS is 7. 3. Moderate chronic small vessel ischemic disease and cerebral atrophy.   CTA neck 12/25/2016 No significant finding.  Vessels widely patent.   CTA head 12/25/2016 Widespread intracranial atherosclerotic disease. Supraclinoid ICA stenoses, 50% on the right and 70% on the left. Atherosclerotic narrowing of the M1 segments bilaterally, 30% on the right and 50% on the left. Into stenoses in distal vessel atherosclerotic narrowing and irregularity diffusely. Atherosclerosis of the distal vertebral and basilar artery but without flow limiting stenosis. Atherosclerotic disease in the PCA territories bilaterally with focal stenoses 70% or greater on each side. Distal vessel atherosclerotic irregularity. Perfusion study shows artifactual T-max data. I do not believe there is any significant at risk brain. One can appreciate some diminished CBF and elongated T-max in the region of right external capsule and radiating white matter tract infarctions.   Dg Chest Portable 1 View 12/25/2016 Stable cardiomegaly with permanent pacemaker. No active lung disease.   2-D echocardiogram Left ventricle: The cavity size was normal. Wall thickness was   increased in a pattern of severe LVH. Systolic function was   normal. The estimated ejection fraction was in the range of 55%  to 60%. Wall motion was normal; there were no regional wall   motion abnormalities. Doppler parameters are consistent with   abnormal left ventricular relaxation (grade 1 diastolic   dysfunction). - Left atrium: The atrium was mildly dilated. - Right ventricle: Pacer wire or catheter noted in right ventricle. Impressions: - No cardiac source of emboli was indentified.  Ct Head Wo Contrast 12/26/2016 IMPRESSION: 1. Stable lucency within right lentiform nucleus with extension into anterior caudate body  and corona radiata, new from 2017, compatible with interval infarct. 2. No new acute intracranial abnormality identified. 3. Moderate chronic microvascular ischemic changes and parenchymal volume loss of the brain. 4. Paranasal sinus disease with fluid levels which may represent acute sinusitis in the appropriate clinical setting.     PHYSICAL EXAM  Temp:  [97.9 F (36.6 C)-98.9 F (37.2 C)] 98.3 F (36.8 C) (03/02 1255) Pulse Rate:  [59-68] 68 (03/02 1255) Resp:  [16-20] 18 (03/02 1255) BP: (115-152)/(33-76) 137/33 (03/02 1255) SpO2:  [96 %-100 %] 96 % (03/02 1255)  General - obese, well developed, lethargic.  Ophthalmologic - Fundi not visualized due to .  Cardiovascular - Regular rate and rhythm.  Mental Status -  Level of arousal and orientation to self, place were intact, but not orientated to time, age, and people. Severe dysarthria, seems able to repeat simple sentences, able to have spontaneous speech output but severe dysarthria and intelligible, following simple commands, but difficulty to name. Right gaze with left neglect.  Cranial Nerves II - XII - II - not blinking to visual threat on the left. III, IV, VI - right gaze preference, barely cross midline. V - Facial sensation intact bilaterally. VII - left facial droop. VIII - Hearing & vestibular intact bilaterally. X - Palate elevation not able to test, however, severe dysarthria. XI - Chin turning & shoulder shrug intact bilaterally. XII - Tongue protrusion intact.  Motor Strength - The patient's strength was 4+/5 RUE proximal, 3/5 LUE proximal, however, distally b/l wrist and fingers contracture. Mild withdraw BLEs on pain stimulation.  Bulk was normal and fasciculations were absent.   Motor Tone - Muscle tone was assessed at the neck and appendages and was increased throughout with b/l hand/wrist.  Reflexes - The patient's reflexes were 1+ in all extremities and he had no pathological reflexes.  Sensory - not  cooperative on exam    Coordination - not cooperative on exam    Gait and Station - not able to test.   ASSESSMENT/PLAN [name] is a 71 y.o. male with history of HTN, CVA, hypothyroidism, DVT on Eliquis, DM II, CKD 3,  LVH S/p pacemaker, who is bed bound in the nursing home presenting with right gaze preference, left facial droop and left-sided weakness. He did not receive IV t-PA due to delay in arrival.   Stroke:  right BG/CR infarcts, etiology consistent with intracranial stenosis and small vessel disease with multiple stroke risk factors (obesity, HLD, DM, HTN).    Resultant  Right gaze, left neglect, left hemiparesis  CT head R lateral lenticulostriate infarct. Aspects 7   CTA head widespread intracranial stenosis (R ICA 50%, L ICA 70%, L M1 50%, B PCA 70%)  CTA neck Unremarkable   Repeat CT head no new abnormalities  2D Echo  EF 55-60%  LDL 235  HgbA1c 7.0  Lovenox 40 mg sq daily for VTE prophylaxis Diet NPO time specified  Eliquis (apixaban) daily prior to admission, now on ASA 300mg  PR. Discussed with primary  service and does not think he needs life long anticoagulation for single episode of PE. Agree to stop eliquis. Recommend DAPT with ASA 325mg  and plavix 75mg  for 3 months once passed swallow and then plavix alone due to severe intracranial stenosis.   Ongoing aggressive stroke risk factor management  Therapy recommendations:  return to SNF  Disposition:  Pending  Chronic anticoagulation  Pt was on eliquis 2.5mg  bid at SNF. However, it was under dosed  As per primary team, it was for single episode of superficial VT and PE.   Agree with discontinue eliquis   Dysphagia  Did not pass swallow  NPO for now  May consider NG tube for TF and medication.   Hypertension  Stable  Permissive hypertension (OK if < 180/105) but gradually normalize in 5-7 days  Long-term BP goal normotensive  Hyperlipidemia  Home meds:  No statin  LDL 235, goal  < 70  Add lipitor 80 once passed swallow  Continue statin at discharge  ? Syphilis  Wife stated that pt neurologist in [place] told her that pt blood syphilis was positive  PRP 12/26/16 negative  Recommend confirmative test of syphilis for further assurance   Other Stroke Risk Factors  Advanced age  Former Cigarette smoker  CPAP use likely for obstructive sleep apnea   Other Active Problems  Hypothyroidism  PTSD  COPD  Hospital day # 2  Neurology will sign off. Please call with questions. Pt will follow up with his neurologist in [place]. Thanks for the consult.   Marvel Plan, MD PhD Stroke Neurology 12/27/2016 2:25 PM    To contact Stroke Continuity provider, please refer to [email or web address]. After hours, contact General Neurology

## 2016-12-28 LAB — GLUCOSE, CAPILLARY
GLUCOSE-CAPILLARY: 102 mg/dL — AB (ref 65–99)
GLUCOSE-CAPILLARY: 129 mg/dL — AB (ref 65–99)
Glucose-Capillary: 101 mg/dL — ABNORMAL HIGH (ref 65–99)
Glucose-Capillary: 139 mg/dL — ABNORMAL HIGH (ref 65–99)
Glucose-Capillary: 148 mg/dL — ABNORMAL HIGH (ref 65–99)
Glucose-Capillary: 151 mg/dL — ABNORMAL HIGH (ref 65–99)
Glucose-Capillary: 157 mg/dL — ABNORMAL HIGH (ref 65–99)

## 2016-12-28 MED ORDER — DEXTROSE-NACL 5-0.45 % IV SOLN
INTRAVENOUS | Status: AC
  Administered 2016-12-28 (×3): via INTRAVENOUS

## 2016-12-28 NOTE — Progress Notes (Signed)
Pt taken off cpap and placed on 3L [place] at this time. VS within normal limits. Pt in no distress. RT will continue to monitor.

## 2016-12-28 NOTE — Progress Notes (Signed)
Internal Medicine Attending:   I saw and examined the patient. I reviewed the resident's note and I agree with the resident's findings and plan as documented in the resident's note. Patient responsive to verbal stimuli, does slowly follow commands but very dysarthric and difficult to communicate, continues to have right gaze preference.  SLP has been working with him but he remains a high aspiration risk. Overall it appears the Mr Paulino DoorObie has had a poor quality of life over the last year and now has had a very debilitating stroke.  Our next steps would be to discuss a gastric tube placement but I am not sure this would be in line with his wishes.  From my conversations with his wife I think that she has wanted aggressive care however I think she would benefit from meeting with palliative care to discuss his poor prognosis and values.  To address other minor issues:  We discussed with Dr Roda ShuttersXu that he does not need A/C for a distal DVT, PE was never actually confirmed with the initial CTA.  He will have DAPT for 3 months then decrease to just plavix. His RPR was negative, I find it hard to believe that a physican found syphillis and did not treat it (especially with the negative RPR) neurology does not believe that he has neruosyphilis however wants confirmatory testing and thus a FTA was ordered.  If this is positive we will need further testing to see if that is actually a true positive. Also of note in our chart review, during his admission in June of 2016 H Pylori ab was obtained, per records plan was to treat if positive, but after treatment of his C diff.  [facility] [place] [facility] [place] [facility] to [place] [facility] [place] happened.  If aggressive care is pursued [facility] to ensure [facility] has had or will have treatment.

## 2016-12-28 NOTE — Progress Notes (Signed)
  Speech Language Pathology Treatment: Dysphagia  Patient Details Name: Aldean Jewettrthur Obie Jr MRN: [id number] DOB: [birth year] Today's Date: 12/28/2016 Time: 0850-0910 SLP Time Calculation (min) (ACUTE ONLY): 20 min  Assessment / Plan / Recommendation Clinical Impression  Patient seen to address dysphagia goals. Patient did verbally respond to yes/no questions but responses were delayed and speech was very dysarthric so it was even difficult to determine if he was saying "yeah" or "no/nah". Clinician performed oral care and only minimal amount of oral secretions were present. He accepted ice chips (one at a time) via spoon and although he slowly masticated, he inconsistently initiated a swallow, and when he did initiate swallow, it was very weak, with limited laryngeal elevation and limited pharyngeal contraction. Patient's attention and alertness declined to the point that clinician stopped administering PO.'s. Patient is still not safe for PO's due to current level of cognitive function and ability to maintain alertness and active participation.   HPI HPI: 71 year old male admitted with left sided facial droop, right gaze preference. Diagnosed with acute right CVA. PMH of CVA, patient bedbound at SNF, answers simple questions by nodding, consumes pureed food at baseline.        SLP Plan  Continue with current plan of care       Recommendations  Diet recommendations: NPO Medication Administration: Via alternative means                Oral Care Recommendations: Oral care QID Follow up Recommendations: Skilled Nursing facility SLP Visit Diagnosis: Dysphagia, unspecified (R13.10) Plan: Continue with current plan of care       GO           Angela NevinJohn T. Preston, MA, CCC-SLP Speech Therapy [facility]

## 2016-12-28 NOTE — Progress Notes (Signed)
Patient placed on CPAP without complication. RT will continue to monitor PRN.

## 2016-12-28 NOTE — Progress Notes (Addendum)
Subjective: [name] was resting but awoke to voice and was interactive although delayed. He denied any active pain or significant discomfort at this time. He remains NPO after continuing to have diminished attentiveness for swallowing coordination based on evaluation yesterday.  Objective:  Vital signs in last 24 hours: Vitals:   12/27/16 2038 12/27/16 2133 12/28/16 0106 12/28/16 0512  BP:  (!) 151/70 (!) 142/72 (!) 152/79  Pulse: 68 66 65 73  Resp: 18 18 20 20   Temp:  98.4 F (36.9 C) 98 F (36.7 C) 98 F (36.7 C)  TempSrc:  Axillary Axillary Axillary  SpO2: 99% 99% 100% 97%  Weight:      Height:       General: Chronically-ill appearing african american sleeping in bed but waking to name. Indicates yes/no when asked questions. HENT: Dry oral mucosa. Prominent left facial droop. Speech slurred. Cardiovascular: Regular rate and rhythm. Early systolic murmur audible over left sternal border.  Pulmonary: CTAB examined in supine position.  Abdomen: Soft, non-distended. +bowel sounds.  Neuro: Following commands, moving RUE to about 1 foot above bed but almost no LUE movement Skin: Warm, dry. No cyanosis.  Assessment/Plan:  Acute Ischemic Stroke: Right lentiform and surrounding structure involved without hemorrhagic conversion. His left sided facial droop and dysarthria have not improved significantly. He already had poor functional status from prior CVA living bedbound full assist in SNF for one year, but currently unsafe swallowing may be a significant decline requiring G tube placement versus high risk of aspiration and probable decline. His wife [name]) is currently inclined towards more aggressive care and has asked we limit involvement of his sister and niece. I am concerned that this may not benefit his quality of life or even his clinical trajectory. He reportedly also has a living will that I have not yet seen. We will consult palliative care to assist with discussing options  given the patient's status and complicated family dynamic. -Wife to bring in living will papers -Atorvastatin 80 mg once taking PO safely -DAPT with ASA/plavix goal of 3 months then plavix indefinitely -Possible return to SNF disposition unclear how much recovery can be expected  History of DVT: [name] was discontinued after review of past VTE, small peripheral venous thrombus and subsegmental PE so no strong indication for lifelong AC. Better benefit/risk for stopping this and covering with antiplatelet therapy so changed yesterday. -[name] only routine VTE ppx dose -Continue PPI indefinitely while on antiplatelet therapy  History of gastritis: Evaluated on EGD 2016 recommending minimizing anticoagulation due to bleeding risk. At that time H. Pylori Ag was also found to be positive with recommended plan to treat if positive. It is unclear from chart review if this was treated in the [place] system or not. Given the overall clinical picture and unclear disposition, I would not retest or empirically treat at this time.  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 Unclear why wife was told he has syphilis "in his blood for many years." After discussion with neurology team and due to family concern we are also confirming with FTA-ABS as a treponemal test though this is not a more sensitive assay. I doubt that he would have a true diagnosis that was not treated or that this would in any way explain his current symptoms.  DM2: Continuing Lantus 13 units and sliding scale. Restarting maintenance fluid with dextrose for NPO status today.  HTN: Holding home Amlodipine 10 mg and Toprol xl 100mg  in setting of acute CVA. He is not  significantly hypertensive  Dispo: Still pending additional family discussion and with palliative care recommendations based on his failure to improve during hospital course so far. May be back to his previous SNF within the next few days.   Keith Planhristopher W Ival Pacer, MD PGY-II Internal  Medicine Resident Pager# [phone] 12/28/2016, 9:34 AM

## 2016-12-29 LAB — GLUCOSE, CAPILLARY
GLUCOSE-CAPILLARY: 150 mg/dL — AB (ref 65–99)
GLUCOSE-CAPILLARY: 167 mg/dL — AB (ref 65–99)
Glucose-Capillary: 146 mg/dL — ABNORMAL HIGH (ref 65–99)
Glucose-Capillary: 167 mg/dL — ABNORMAL HIGH (ref 65–99)
Glucose-Capillary: 162 mg/dL — ABNORMAL HIGH (ref 65–99)

## 2016-12-29 MED ORDER — DEXTROSE-NACL 5-0.45 % IV SOLN
INTRAVENOUS | Status: AC
  Administered 2016-12-29 – 2016-12-30 (×2): via INTRAVENOUS

## 2016-12-29 NOTE — Progress Notes (Signed)
Palliative Medicine Team consult was received.  Attempted to see patient this afternoon, but no family present in room at time of encounter.  I called and reached his wife, Ezequiel EssexJocelyn.  She will not be back to the hospital until tomorrow at 1PM (stated she may be a little later than this, but should be here by 2PM).  We have set up a family meeting for this time.   If there are urgent needs or questions please call [phone]. Thank you for consulting out team to assist with this patients care.  Romie MinusGene Laney Louderback, MD [facility] Palliative Medicine Team [phone]  NO CHARGE NOTE

## 2016-12-29 NOTE — Progress Notes (Signed)
   Subjective: [name] was seen and evaluated today at bedside. He was in no distress and resting. Did awken to verbal stimuli and was interactive however responses continue to remain delayed. He denied any pain, events overnight or concerns.  Objective:  Vital signs in last 24 hours: Vitals:   12/28/16 2100 12/29/16 0201 12/29/16 0511 12/29/16 1103  BP: (!) 173/83 (!) 186/97 (!) 166/88 (!) 141/68  Pulse: 65 69 60 63  Resp: 20 20 20 20   Temp: 98.4 F (36.9 C) 98 F (36.7 C) 97.6 F (36.4 C) 98.1 F (36.7 C)  TempSrc: Oral Oral Axillary Oral  SpO2: 100% 100% 100% 100%  Weight:      Height:       General: Chronically-ill appearing african american sleeping in bed but waking to name. Indicates yes/no when asked questions. HENT: Dry oral mucosa. Prominent left facial droop. Speech slurred. Cardiovascular: Regular rate and rhythm. Early systolic murmur audible over left sternal border.  Pulmonary: CTAB examined in supine position.  Abdomen: Soft, non-distended. +bowel sounds.  Neuro: Following commands, moving RUE to about 1 foot above bed but almost no LUE movement Skin: Warm, dry. No cyanosis.  Assessment/Plan:  Acute Ischemic Stroke: Right lentiform and surrounding structure involved without hemorrhagic conversion. His left sided facial droop and dysarthria do not appear to have improved. Unfortunately, the patient already has poor functional status at baseline (due [facility] His wife, his HCPOA, was inclined yesterday towards more aggressive management however she was not available for discussion today. Palliative care has been consulted to assist with discussion regarding goals of care and his clinical trajectory. The patient apparently has living will however wife has yet to produce these.  -Palliative care consulted, greatly appreciate their assistance in management of this patient. Difficult family situation  -Wife to bring in  living will papers -Atorvastatin 80 mg once taking PO safely -Dual antiplatelet therapy x 3 months -DAPT with ASA/plavix goal of 3 months then plavix indefinitely -Possible return to SNF disposition unclear how much recovery can be expected  History of DVT: Lovenox for routine VTE prophylaxis.  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 Unclear why wife was told he has syphilis "in his blood for many years." Confirming with FTA-ABS as a treponemal test though this is not a more sensitive assay.  DM2: Continuing Lantus 13 units and sliding scale. D5 1/2 ns @ 75 mL/hr as patient is NPO.  HTN: Holding home Amlodipine 10 mg and Toprol xl 100mg   Dispo: Still pending additional family discussion with palliative care.   Severiano Utsey, D.O. Internal Medicine Resident - PGY1 Pager: [phone]

## 2016-12-30 DIAGNOSIS — Z7189 Other specified counseling: Secondary | ICD-10-CM

## 2016-12-30 DIAGNOSIS — Z7401 Bed confinement status: Secondary | ICD-10-CM

## 2016-12-30 DIAGNOSIS — Z515 Encounter for palliative care: Secondary | ICD-10-CM

## 2016-12-30 DIAGNOSIS — R1310 Dysphagia, unspecified: Secondary | ICD-10-CM

## 2016-12-30 LAB — CULTURE, BLOOD (ROUTINE X 2): Culture: NO GROWTH

## 2016-12-30 LAB — GLUCOSE, CAPILLARY
GLUCOSE-CAPILLARY: 148 mg/dL — AB (ref 65–99)
GLUCOSE-CAPILLARY: 159 mg/dL — AB (ref 65–99)
GLUCOSE-CAPILLARY: 164 mg/dL — AB (ref 65–99)
GLUCOSE-CAPILLARY: 173 mg/dL — AB (ref 65–99)
GLUCOSE-CAPILLARY: 175 mg/dL — AB (ref 65–99)
GLUCOSE-CAPILLARY: 181 mg/dL — AB (ref 65–99)
Glucose-Capillary: 195 mg/dL — ABNORMAL HIGH (ref 65–99)
Glucose-Capillary: 180 mg/dL — ABNORMAL HIGH (ref 65–99)

## 2016-12-30 LAB — FLUORESCENT TREPONEMAL AB(FTA)-IGG-BLD: FLUORESCENT TREPONEMAL AB, IGG: REACTIVE — AB

## 2016-12-30 MED ORDER — FAMOTIDINE IN NACL 20-0.9 MG/50ML-% IV SOLN
20.0000 mg | INTRAVENOUS | Status: DC
  Administered 2016-12-30: 20 mg via INTRAVENOUS
  Filled 2016-12-30: qty 50

## 2016-12-30 MED ORDER — LEVOTHYROXINE SODIUM 100 MCG IV SOLR
100.0000 ug | Freq: Every day | INTRAVENOUS | Status: DC
  Administered 2016-12-30 – 2016-12-31 (×2): 100 ug via INTRAVENOUS
  Filled 2016-12-30 (×2): qty 5

## 2016-12-30 NOTE — Consult Note (Signed)
Consultation Note Date: 12/30/2016   Patient Name: [name]  DOB: 1946/09/06  MRN: [id number]  Age / Sex: 71 y.o., male  PCP: Keith Morin, MD Referring Physician: Lucious Groves, DO  Reason for Consultation: Establishing goals of care r/t overall functional decline and now worsening prognosis with new stroke.   HPI/Patient Profile: 71 y.o. male  with past medical history of stroke, HTN, hypothyroidism, DVT on Eliquis, diabetes mellitus II, CKD III, s/p pacemaker, PTSD, diastolic CHF, h/o C. diff, cirrhosis admitted on 12/25/2016 with new stroke. Remains NPO and lethargic with fluctuating mental status but overall very poor prognosis and lack of improvement.   Clinical Assessment and Goals of Care: I met today at [name] bedside with Dr. Danford Li and [name] wife [name]. We had a long discussion regarding [name] poor functional status and QOL and poor prognosis. We spent much time discussing how this stroke has effected him and options at this point. We discussed expectations with feeding tube vs comfort feeding.   [name] tells [place] that he has expressed to her about his poor QOL in the past (bed bound) and has even mentioned that he may be better if he were just to die. She expresses that he has expressed he would never want to be hooked up to machines - DNR placed. She understands why a feeding tube would not be recommended and from reviewing his Living Will in chart it seems he would not desire artificial feeding in his condition.   She understands that he likely does not have long to live. She desires for him to be closer to her (he has been in SNF in [place] and she lives in [place] and does not drive). We did discuss the option of hospice for comfort care so that she can spend time with him. She is extremely tearful but is calling her friends to explain that he will be going to hospice.  Provided emotional support. Called chaplain for extra support.   Primary Decision Maker HCPOA wife [name]   - DNR - No feeding tube - Likely transition to hospice facility (would have to be [facility] so wife can get to him) in the next couple days  Code Status/Advance Care Planning:  DNR   Symptom Management:   No signs of pain/discomfort  Considering comfort feeds if alert enough  Palliative Prophylaxis:   Aspiration, Bowel Regimen, Delirium Protocol, Frequent Pain Assessment, Oral Care and Turn Reposition  Additional Recommendations (Limitations, Scope, Preferences):  No Artificial Feeding  Psycho-social/Spiritual:   Desire for further Chaplaincy support:yes  Additional Recommendations: Caregiving  Support/Resources, Education on Hospice and Grief/Bereavement Support  Prognosis:   < 2 weeks with no artificial feeding and no intake   Discharge Planning: Hospice facility      Primary Diagnoses: Present on Admission: . Stroke (cerebrum) ([place]) . CKD (chronic kidney disease) stage 3, GFR 30-59 ml/min . Diabetes mellitus type 2, uncontrolled ([place]) . Essential hypertension . Hypothyroidism . Hypokalemia . Autoimmune hepatitis (  HCC) . Malnutrition of moderate degree . Hyperlipidemia . Diastolic dysfunction   I have reviewed the medical record, interviewed the patient and family, and examined the patient. The following aspects are pertinent.  Past Medical History:  Diagnosis Date  . Autoimmune hepatitis ([place])   . C. difficile colitis   . Cirrhosis ([place])   . Diabetes mellitus without complication ([place])   . DVT (deep venous thrombosis) ([place])   . History of deep venous thrombosis (DVT) of distal vein of right lower extremity    02/08/08 Non occlusive DVT of peroneal vein  . Hypertension   . Hypothyroidism   . Presence of permanent cardiac pacemaker   . PTSD (post-traumatic stress disorder)   . Stroke [facility])    Social  History   Social History  . Marital status: Married    Spouse name: N/A  . Number of children: N/A  . Years of education: N/A   Social History Main Topics  . Smoking status: Former Research scientist (life sciences)  . Smokeless tobacco: Never Used     Comment: quit years ago  . Alcohol use No  . Drug use: No  . Sexual activity: Not Asked   Other Topics Concern  . None   Social History Narrative  . None   Family History  Problem Relation Age of Onset  . Diabetes Mellitus II Sister   . CAD Sister   . Hypertension Sister    Scheduled Meds: .  stroke: mapping our early stages of recovery book   Does not apply Once  . aspirin EC  325 mg Oral Daily   Or  . aspirin  300 mg Rectal Daily  . atorvastatin  80 mg Oral q1800  . brimonidine  1 drop Left Eye BID  . enoxaparin (LOVENOX) injection  40 mg Subcutaneous Q24H  . famotidine (PEPCID) IV  20 mg Intravenous Q24H  . insulin aspart  0-9 Units Subcutaneous Q4H  . levothyroxine  100 mcg Intravenous Daily  . mometasone-formoterol  2 puff Inhalation BID  . sertraline  200 mg Oral Daily  . tamsulosin  0.4 mg Oral Daily   Continuous Infusions: PRN Meds:.acetaminophen **OR** acetaminophen (TYLENOL) oral liquid 160 mg/5 mL **OR** acetaminophen, benzonatate, hydroxypropyl methylcellulose / hypromellose, polyvinyl alcohol No Known Allergies Review of Systems  Unable to perform ROS: Acuity of condition    Physical Exam  Constitutional: He appears well-developed. He appears lethargic.  HENT:  Head: Normocephalic and atraumatic.  Cardiovascular: Normal rate.   Pulmonary/Chest: Effort normal. No accessory muscle usage. No tachypnea. No respiratory distress.  Abdominal: Normal appearance.  Neurological: He appears lethargic.  Nursing note and vitals reviewed.   Vital Signs: BP (!) 146/89 (BP Location: Right Arm)   Pulse 71   Temp 98.4 F (36.9 C) (Axillary)   Resp 18   Ht '6\' 3"'$  (1.905 m) Comment: per chart  Wt 104.3 kg (230 lb) Comment: per chart   SpO2 100%   BMI 28.75 kg/m  Pain Assessment: PAINAD   Pain Score: Asleep   SpO2: SpO2: 100 % O2 Device:SpO2: 100 % O2 Flow Rate: .O2 Flow Rate (L/min): 3 L/min  IO: Intake/output summary:  Intake/Output Summary (Last 24 hours) at 12/30/16 1455 Last data filed at 12/30/16 0440  Gross per 24 hour  Intake             1000 ml  Output             1675 ml  Net             -  675 ml    LBM:   Baseline Weight: Weight: 104.3 kg (230 lb) Most recent weight: Weight: 104.3 kg (230 lb) (per chart)     Palliative Assessment/Data:   Flowsheet Rows   Flowsheet Row Most Recent Value  Intake Tab  Referral Department  -- [internal medicine]  Unit at Time of Referral  Med/Surg Unit  Palliative Care Primary Diagnosis  Neurology  Date Notified  12/28/16  Palliative Care Type  New Palliative care  Reason for referral  Clarify Goals of Care  Date of Admission  12/25/16  Date first seen by Palliative Care  12/30/16  # of days Palliative referral response time  2 Day(s)  # of days IP prior to Palliative referral  3  Clinical Assessment  Psychosocial & Spiritual Assessment  Palliative Care Outcomes      Time In: 1400 Time Out: 1510 Time Total: 76mn  Greater than 50%  of this time was spent counseling and coordinating care related to the above assessment and plan.  Signed by: AVinie Sill NP Palliative Medicine Team Pager # [phone](M-F 8a-5p) Team Phone # [phone](Nights/Weekends)

## 2016-12-30 NOTE — Progress Notes (Addendum)
  Speech Language Pathology Treatment: Dysphagia  Patient Details Name: [name] MRN: [id number] DOB: 10-Dec-1945 Today's Date: 12/30/2016 Time: [id number] SLP Time Calculation (min) (ACUTE ONLY): 18 min  Assessment / Plan / Recommendation Clinical Impression  Pt seen at bedside for assessment of po readiness. Wife in attendance this date. Oral care was completed with suction, as pt exhibited open-mouth breathing and thick secretions were noted on lingual and palatal surfaces. Oral care awakened patient momentarily, so SLP provided ice chip. Pt exhibited no manipulation of the ice chip, and no swallow reflex was elicited. Melted ice was suctioned from pt oral cavity. No further po trials were given. Recommend continuing with NPO status, including meds, and consideration of non-oral feeding method if within pt/family wishes, given length of time NPO. Palliative Care meeting with wife pending this afternoon. RN informed of pt current status. ST will continue to follow.   HPI HPI: 71 year old male admitted with left sided facial droop, right gaze preference. Diagnosed with acute right CVA. PMH of CVA, patient bedbound at SNF, answers simple questions by nodding, consumes pureed food at baseline.        SLP Plan  Continue with current plan of care       Recommendations  Diet recommendations: NPO Medication Administration: Via alternative means                Oral Care Recommendations: Oral care QID;Staff/trained caregiver to provide oral care Follow up Recommendations: Skilled Nursing facility SLP Visit Diagnosis: Dysphagia, unspecified (R13.10) Plan: Continue with current plan of care       Robin Pafford B. Murvin NatalBueche, [facility]oscopy LLCMSP, CCC-SLP [phone]  Leigh AuroraBueche, Caillou Minus Brown 12/30/2016, 11:45 AM

## 2016-12-30 NOTE — Progress Notes (Signed)
Patient not placed on CPAP tonight due to patient's inability to remove mask in case of emergency.

## 2016-12-30 NOTE — Care Management Important Message (Signed)
Important Message  Patient Details  Name: [name] MRN: [id number] Date of Birth: [birth year]   Medicare Important Message Given:  Yes    Dorena BodoIris Aubre Quincy 12/30/2016, 1:55 PM

## 2016-12-30 NOTE — Progress Notes (Signed)
Occupational Therapy Treatment Patient Details Name: [name] MRN: [id number] DOB: [birth year] Today's Date: 12/30/2016    History of present illness Pt is a 71 y/o male with a PMH significant for CVA, HTN, DVT, DMII, CKD 3, s/p pacemaker. Pt is from a SNF (been there for about a year per wife), and at baseline is non-ambulatory. Pt presents to the ED with R-sided gaze, L facial droop, and L sided weakness. CT revealed acute to early subacute right lateral lenticulostriate territory   OT comments  This 71 yo male admitted with above seen today. Wife not present. Pt is tolerating wear of LUE palm protector (no reddened areas)_and today he is more alert and turning his head to mid line or beyond to left with increased time and cues. Pt left with right hand washcloth roll, left hand palm protector and towel roll under pillow on his right to help facilitate more midline head posture.  Follow Up Recommendations  SNF;Supervision/Assistance - 24 hour    Equipment Recommendations  None recommended by OT       Precautions / Restrictions Precautions Precautions: Fall Restrictions Weight Bearing Restrictions: No                    Vision                 Additional Comments: Pt more alert this session. He was able to turn his head and eyes to 1/2 way past midline on left x1 when asked,, follow me from right to left around the foot of his bed with Mod verbal cues to 1/4 left of midline and follow me x1 to midline to foot of his bed.           Cognition   Behavior During Therapy: Flat affect Overall Cognitive Status: Impaired/Different from baseline          Following Commands: Follows one step commands consistently       General Comments: Pt able to follow 1 step commands of raising right arm, then left arm, open and close hand on left, give thumbs up on right. Pt also answered yes/no questions appropriately with head nods and/or verbally with increased time       Exercises Other Exercises Other Exercises: Looked at pt's LUE with palm guard. No noted reddened/pressure areas. Hand washed and dried, microguard powder applied and palm protector reapplied. Also looked at RUE with washcloth roll--no issues noted in this hand as well, same hygiene as LUE and new washcloth roll reapplied.           Pertinent Vitals/ Pain       Pain Assessment: No/denies pain Faces Pain Scale: No hurt         Frequency  Min 2X/week        Progress Toward Goals  OT Goals(current goals can now be found in the care plan section)  Progress towards OT goals: Progressing toward goals     Plan Discharge plan remains appropriate       End of Session    OT Visit Diagnosis: Hemiplegia and hemiparesis Hemiplegia - Right/Left:  (both) Hemiplegia - dominant/non-dominant:  (both) Hemiplegia - caused by: Cerebral infarction   Activity Tolerance Patient tolerated treatment well   Patient Left in bed;with bed alarm set   Nurse Communication  (pt more alert this session and following commands)        Time: 4098-1191 OT Time Calculation (min): 17 min  Charges: OT General Charges $OT Visit: 1  Procedure OT Treatments $Therapeutic Activity: 8-22 mins  Ignacia PalmaCathy Allyssa Abruzzese, OTR/L [phone] 12/30/2016

## 2016-12-30 NOTE — Progress Notes (Signed)
   12/30/16 1500  Clinical Encounter Type  Visited With Patient and family together  Visit Type Patient actively dying  Spiritual Encounters  Spiritual Needs Prayer  Stress Factors  Patient Stress Factors Major life changes  Family Stress Factors Family relationships  Introduction to Pt and wife. Offered prayer of comfort and hope.

## 2016-12-30 NOTE — Progress Notes (Signed)
Internal Medicine Attending:   I saw and examined the patient. I reviewed the resident's note and I agree with the resident's findings and plan as documented in the resident's note. No improvement neurologically.  Seems to understand some of our questions however cannot consistently communicate.  We are currently awaiting family meeting this afternoon with palliative care to help determine goals of care.

## 2016-12-30 NOTE — Progress Notes (Signed)
   Subjective: [name] was seen and evaluated today at bedside. He did not appear to be distressed. Was awake during evaluation however did not respond consistently to questions. Did indicate that he was not in pain and that he was fine overnight.   Objective:  Vital signs in last 24 hours: Vitals:   12/29/16 1449 12/29/16 1823 12/29/16 2052 12/30/16 0500  BP: (!) 134/58 132/69 128/70 136/66  Pulse: 64 70 74 74  Resp: 20 20 20 18   Temp: 98.9 F (37.2 C) 98.4 F (36.9 C) 98.6 F (37 C) 98.7 F (37.1 C)  TempSrc: Oral Axillary Axillary Axillary  SpO2: 97% 100% 99% 100%  Weight:      Height:       General: Chronically-ill appearing african [place] male laying in bed. No acute distress. HENT: Dry oral mucosa. Prominent left facial droop. Cardiovascular: Regular rate and rhythm. Early systolic murmur.  Pulmonary: CTAB examined in supine position.  Abdomen: Soft, non-distended, did not grimace with palpation. +bowel sounds.  Neuro: Lethargic and minimally interactive. Did not follow commands. Occasionally answered questions with yes/no answers vs mumbling.  Skin: Warm, dry. No cyanosis.  Assessment/Plan:  Acute Ischemic Stroke: Right lentiform and surrounding structure involved without hemorrhagic conversion. His left sided facial droop and dysarthria have not improved. He has poor functional status at baseline due to prior CVA (bedbound at his SNF, requires full assistance, and minimally interactive). Palliative care is on board and have arranged a meeting with his wife (HCPOA) this afternoon at 2 pm. From previous conversations, his wife is inclined to do aggressive measures however do not believe this is the best option for this patient given his functional status, minimal interaction and inability to take PO. This is a complicated family dynamic and appropriate goals of care discussion is vital in determining the next steps for this patient. -Palliative care family meeting today --  will hopefully clarify goals of care and plans going forward -Wife to bring in pts living will -- have yet to see this document -Dual antiplatelet therapy x3 months with ASA/plavix  -Atorvastatin 80 mg once safely taking PO -Possible return to SNF  History of DVT: Lovenox for routine VTE prophylaxis. Patient does not appear to meet criteria for life-long anticoagulation and believe this poses more harm than benefit in this patient.   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 Unclear why wife was told he has syphilis "in his blood for many years." Confirming with FTA-ABS as a treponemal test though this is not a more sensitive assay.  DM2: Continuing Lantus 13 units and sliding scale. D5 1/2 ns @ 75 mL/hr as patient is NPO. Should consider benefit vs risk of insulin use in this patient with no PO intake.   HTN: Holding home Amlodipine 10 mg and Toprol xl 100mg   Dispo: Still pending additional family discussion with palliative care.   [name], D.O. Internal Medicine Resident - PGY1 Pager: [phone]

## 2016-12-31 ENCOUNTER — Encounter (HOSPITAL_COMMUNITY): Payer: Self-pay | Admitting: General Practice

## 2016-12-31 DIAGNOSIS — E1165 Type 2 diabetes mellitus with hyperglycemia: Secondary | ICD-10-CM

## 2016-12-31 LAB — GLUCOSE, CAPILLARY
Glucose-Capillary: 151 mg/dL — ABNORMAL HIGH (ref 65–99)
Glucose-Capillary: 144 mg/dL — ABNORMAL HIGH (ref 65–99)
Glucose-Capillary: 151 mg/dL — ABNORMAL HIGH (ref 65–99)

## 2016-12-31 NOTE — Progress Notes (Signed)
Patient's wife in room with sister in law. Requested chaplain to visit patient's room before d/c to hospice. Chaplain at bedside.

## 2016-12-31 NOTE — Progress Notes (Signed)
Internal Medicine Attending:   I saw and examined the patient. I reviewed the resident's note and I agree with the resident's findings and plan as documented in the resident's note. [name] has not had any function recovery after his stroke.  Family has met with palliative care yesterday and family has decided on transition to comfort care. We are working on placement to inpatient hospice facility.  I agree with Dr Rober Minion assessment of syphilis testing there is not evidence for current syphilis infection and FTA-ab likely represents past infection.  Agree with D/C of IVF, can discontinue atorvastatin (not taking anyway due to NPO), may also discontinue PR aspirin.

## 2016-12-31 NOTE — Clinical Social Work Note (Addendum)
Clinical Social Work Assessment  Patient Details  Name: [name] MRN: [id number] Date of Birth: 10-23-1946  Date of referral:  12/31/16               Reason for consult:  End of Life/Hospice                Permission sought to share information with:  Family Supports Permission granted to share information::  No (pt non-verbal)  Name::     [name]  Agency::     Relationship::  Spouse  Contact Information:  [phone]  Housing/Transportation Living arrangements for the past 2 months:  Apartment Source of Information:  Spouse Patient Interpreter Needed:  None Criminal Activity/Legal Involvement Pertinent to Current Situation/Hospitalization:  No - Comment as needed Significant Relationships:  Spouse Lives with:  Spouse Do you feel safe going back to the place where you live?  Yes Need for family participation in patient care:  Yes (Comment)  Care giving concerns:  No caregiving needs identified.   Social Worker assessment / plan:  CSW met with pt and wife, [name] at bedside to address consult for residential hospic placement. PA was present during visit. CSW introduced herself and explained role of Education officer, museum. CSW confirmed with wife her decision to pursue [facility] place for placement. CSW explained hospice placement process. Per PA, other family members are not aware of pt's disposition to Hospice and there is discord between spouse and family. CSW discussed with [facility] ([place] potential bed offer. [facility] confirmed bed offer and completed paperwork with wife. CSW communicated with PA to confirm bed offer.   Pt is ready for discharge today and will go to [facility]. CSW faxed d/c summary to [facility] place.  Report number provided to RN and put in treatment team sticky note. Transportation arranged with [facility]. Signed DNR on chart. CSW is signing off as no further needs identified.  Employment status:  Retired Forensic scientist:  Other (Comment Required) Product/process development scientist) PT Recommendations:  [place] (Palliative recommending hospice) Information / Referral to community resources:  Other (Comment Required) (Hospice residential)  Patient/Family's Response to care:  Wife appreciative of CSW support.   Patient/Family's Understanding of and Emotional Response to Diagnosis, Current Treatment, and Prognosis:  Wife emotional, but feels hospice is the right decision for further pt care.   Emotional Assessment Appearance:  Appears stated age Attitude/Demeanor/Rapport:   (Appropriate) Affect (typically observed):  Quiet (Responds to voice; unable to follow voice commands) Orientation:  Oriented to Self Alcohol / Substance use:  Other Psych involvement (Current and /or in the community):  No (Comment)  Discharge Needs  Concerns to be addressed:  Care Coordination Readmission within the last 30 days:  No Current discharge risk:  Terminally ill Barriers to Discharge:  Continued Medical Work up   [employer], LCSW 12/31/2016, 1:42 PM

## 2016-12-31 NOTE — Progress Notes (Signed)
Patient not placed on CPAP tonight due to stroke and inability to remove mask in case of emergency. RT will continue to monitor.

## 2016-12-31 NOTE — Care Management Note (Signed)
Case Management Note  Patient Details  Name: Aldean Jewettrthur Obie Jr MRN: [id number] Date of Birth: 07-13-1946  Subjective/Objective:                    Action/Plan: [facility]. No further needs per CM.  Expected Discharge Date:  12/31/16               Expected Discharge Plan:  Hospice Medical Facility  In-House Referral:  Clinical Social Work  Discharge planning Services     Post Acute Care Choice:    Choice offered to:     DME Arranged:    DME Agency:     HH Arranged:    HH Agency:     Status of Service:  Completed, signed off  If discussed at [employer] Meetings, dates discussed:    Additional Comments:  Kermit BaloKelli F Kimeka Badour, RN 12/31/2016, 6:50 PM

## 2016-12-31 NOTE — Consult Note (Signed)
[facility] CM Primary Care Navigator  12/31/2016  [name] Jul 27, 1946 [id number]   Went to see patient at the bedside to identify possible discharge needs. RN reports that wife is looking into hospice. Met with patient's wife at the bedside and states awaiting hospice placement.  Patient transitioning to comfort care and family is pursuing residential hospice.    For questions, please contact:  Dannielle Huh, BSN, RN- [facility] Primary Care Navigator  Telephone: [phone] [place]

## 2016-12-31 NOTE — Progress Notes (Signed)
Responded to nurse's request to see pt when on floor to see another. Pt's wife was teary in contemplating her husband's imminent release to hospice. She felt guilty in some ways that she did not have the help to take him home [place] -- [place] said she [place]'ve [place] it.   She was especially upset w/ a prior outside facility (rehab?) that she did not believe had given pt his medications or cared for him properly -- which in her eyes was a contributing factor in his worsened condition that led him to come [facility] as he now is. She reported that facility to state regulators and were happy they had just visited it and insisted on talking to the healthcare worker who knew what had happened in her husband's care there. She understands he was sick, but feels that his last days could have been easier had circumstances been otherwise at that prior facility.   Provided emotional/spiritual/grief support and prayer. Pt's wife really appreciated latter. Chaplain available for f/u if [facility].   12/31/16 1400  Clinical Encounter Type  Visited With Patient and family together  Visit Type Follow-up;Psychological support;Spiritual support;Social support;Other (Comment) (pt about to be released to hospice)  Referral From Nurse  Spiritual Encounters  Spiritual Needs Prayer;Emotional;Grief support  Stress Factors  Patient Stress Factors Health changes;Loss of control;Major life changes  Family Stress Factors Family relationships;Health changes;Loss   [name], Chaplain

## 2016-12-31 NOTE — Discharge Summary (Signed)
Name: [name] MRN: [id number] DOB: [birth year] 71 y.o. PCP: Keane PoliceVenezela Slade-Hartman, MD  Date of Admission: 12/25/2016  9:56 AM Date of Discharge: 12/31/2016 Attending Physician: Gust RungErik C Hoffman, DO  Discharge Diagnosis: Principal Problem:   Stroke (cerebrum) [facility](HCC) Active Problems:   Diabetes mellitus type 2, uncontrolled (HCC)   Hypothyroidism   Essential hypertension   CKD (chronic kidney disease) stage 3, GFR 30-59 ml/min   Hypokalemia   History of deep venous thrombosis (DVT) of distal vein of right lower extremity   Autoimmune hepatitis (HCC)   Malnutrition of moderate degree   Left-sided weakness   Hyperlipidemia   Diastolic dysfunction   Oropharyngeal dysphagia   Goals of care, counseling/discussion   Palliative care encounter   Dysphagia   Discharge Medications: Allergies as of 12/31/2016   No Known Allergies     Medication List    STOP taking these medications   acidophilus Caps capsule   amLODipine 10 MG tablet Commonly known as:  NORVASC   atropine 1 % ophthalmic solution   brimonidine 0.2 % ophthalmic solution Commonly known as:  ALPHAGAN   cephALEXin 500 MG capsule Commonly known as:  KEFLEX   dorzolamide-timolol 22.3-6.8 MG/ML ophthalmic solution Commonly known as:  COSOPT   ELIQUIS 2.5 MG Tabs tablet Generic drug:  apixaban   erythromycin ophthalmic ointment   glucagon 1 MG injection   hydroxypropyl methylcellulose / hypromellose 2.5 % ophthalmic solution Commonly known as:  ISOPTO TEARS / GONIOVISC   insulin glargine 100 UNIT/ML injection Commonly known as:  LANTUS   insulin lispro 100 UNIT/ML injection Commonly known as:  HUMALOG   ketoconazole 2 % cream Commonly known as:  NIZORAL   latanoprost 0.005 % ophthalmic solution Commonly known as:  XALATAN   levothyroxine 200 MCG tablet Commonly known as:  SYNTHROID, LEVOTHROID   lisinopril 5 MG tablet Commonly known as:  PRINIVIL,ZESTRIL   metoprolol succinate 50 MG 24 hr  tablet Commonly known as:  TOPROL-XL   polyethylene glycol packet Commonly known as:  MIRALAX / GLYCOLAX   prednisoLONE acetate 1 % ophthalmic suspension Commonly known as:  PRED FORTE   ranitidine 150 MG tablet Commonly known as:  ZANTAC   tamsulosin 0.4 MG Caps capsule Commonly known as:  FLOMAX   Vitamin D3 2000 units Tabs     TAKE these medications   acetaminophen 325 MG tablet Commonly known as:  TYLENOL Take 650 mg by mouth every 6 (six) hours as needed.   benzonatate 100 MG capsule Commonly known as:  TESSALON Take by mouth 3 (three) times daily as needed for cough.   budesonide-formoterol 160-4.5 MCG/ACT inhaler Commonly known as:  SYMBICORT Inhale 2 puffs into the lungs 2 (two) times daily.   GLUTOSE 15 40 % Gel Generic drug:  dextrose Take 1 Tube by mouth once as needed for low blood sugar.   IMODIUM A-D 2 MG tablet Generic drug:  loperamide Take 2 mg by mouth 3 (three) times daily as needed for diarrhea or loose stools.   polyvinyl alcohol 1.4 % ophthalmic solution Commonly known as:  LIQUIFILM TEARS Place 1 drop into both eyes daily as needed for dry eyes.   senna 8.6 MG Tabs tablet Commonly known as:  SENOKOT Take 1 tablet by mouth as needed for mild constipation.   sertraline 100 MG tablet Commonly known as:  ZOLOFT Take 200 mg by mouth daily.   traMADol 50 MG tablet Commonly known as:  ULTRAM Take 1 tablet (50 mg total) by mouth  every 6 (six) hours as needed.       Disposition and follow-up:   KeithKeith Li was discharged from [facility] in Stable condition.  At the hospital follow up visit please address:  1.  Life limiting debility following repeat stroke. No plan for aggressive care or extreme measures for prolonging life. Consider blood glucose management if symptomatically low or dehydrated due to NPO status.  Follow-up Appointments: Follow-up Information    his neurologist in  Follow up.            Hospital Course by problem list: Principal Problem:   Stroke (cerebrum) (HCC) Active Problems:   Diabetes mellitus type 2, uncontrolled (HCC)   Hypothyroidism   Essential hypertension   CKD (chronic kidney disease) stage 3, GFR 30-59 ml/min   Hypokalemia   History of deep venous thrombosis (DVT) of distal vein of right lower extremity   Autoimmune hepatitis (HCC)   Malnutrition of moderate degree   Left-sided weakness   Hyperlipidemia   Diastolic dysfunction   Oropharyngeal dysphagia   Goals of care, counseling/discussion   Palliative care encounter   Dysphagia   1. Acute CVA [name]. Is a 71 year old male admitted to [facility] 12/25/16 after being found at his nursing home with left facial droop, right gaze preference and worsening left-sided weakness. His last known normal was 12+ hours prior to admission. He has medical history significant for 2 prior CVA's with residual diffuse weakness and dysarthria. He was not on ASA due to history of significant gastritis and was not on statin due to history of autoimmune hepatitis. He also has history significant for HLD, HTN, uncontrolled type 2 diabetes, hypothyroidism, CKD stage III and history of DVT. CT head showed an early to early-subacute right lateral lenticulostriate infarction without acute hemorrhage. CTA head showed widespread intracranial atherosclerotic disease, which was not amenable to stenting. Echocardiogram was performed which showed normal EF and was without PFO or other intracardiac cause of embolus. Lipid panel significant for high cholesterol however was unable to start on statin therapy due to patients dysphagia. HbA1c 7.0%.   2. History of DVT Patient previously on low dose anticoagulation for a remote peroneal vein thrombus and left lower lobe subsegmental PE. No strong indication for indefinite treatment so stopped during admission.  3. Dysphagia [name] continued to fail swallow evaluations performed by our  SLP due to pocketing of food and no indication of swallowing. This did not improve during hospitalization. He was NPO during his time with [place] and received several days of IVF with dextrose. Upon discussing feeding tube/peg tube placement in relation to Mr. Adria Dill living will provided by his wife, it was determined that PEG tube/G-tube would not be in his best interest as this would not improve his current condition and he would undergo a surgical procedure. His living will indicates that [name] does not want life-prolonging measures if there were no meaningful recovery expected. Can consider giving some symptom controlling medications or comfort feeds if requested.  4. Hospice Care Palliative care was consulted to discuss management going forward with now a more directly life limiting decline in status after over a year bed bound requiring full assistance. He has not shown any meaningful improvement for 5 days here. Care team and patient family are in agree with discharge to hospice facility for symptomatic treatment with an expected demise within days to a few weeks.   Discharge Vitals:   BP (!) 145/88 (BP Location:  Right Arm)   Pulse 83   Temp 97.8 F (36.6 C) (Axillary)   Resp 18   Ht 6\' 3"  (1.905 m) Comment: per chart  Wt 230 lb (104.3 kg) Comment: per chart  SpO2 95%   BMI 28.75 kg/m   Pertinent Labs, Studies, and Procedures:   2/28: CT HEAD WITHOUT CONTRAST  IMPRESSION: 1. Acute to early subacute right lateral lenticulostriate territory infarct. No acute hemorrhage. 2. ASPECTS is 7. 3. Moderate chronic small vessel ischemic disease and cerebral Atrophy.   Discharge Instructions: Discharge Instructions    Discharge patient    Complete by:  As directed    Discharge disposition:  [facility] Not Defined   Discharge patient date:  12/31/2016      Signed:  Fuller Plan, MD PGY-II Internal Medicine Resident Pager# [phone] 12/31/2016, 3:18  PM

## 2016-12-31 NOTE — Progress Notes (Signed)
   Subjective: Mr. Paulino DoorObie was seen and evaluated today at bedside. Sister present. Wife was in meeting with palliative care. Quite lethargic however he did not appear to be distressed.  Objective:  Vital signs in last 24 hours: Vitals:   12/30/16 1850 12/30/16 2122 12/31/16 0052 12/31/16 0450  BP: (!) 148/66 140/85 (!) 144/76 132/83  Pulse: 79 77 79 80  Resp: 18 16 18 18   Temp: 98.8 F (37.1 C) 98.8 F (37.1 C) 98.6 F (37 C) 98.3 F (36.8 C)  TempSrc: Axillary Axillary Axillary Axillary  SpO2: 100% 100% 100% 100%  Weight:      Height:       General: Chronically-ill appearing african [place] male laying in bed. No acute distress. Lethargic however awakens to verbal stimuli.  HENT: Prominent left facial droop. Cardiovascular: Regular rate and rhythm. Early systolic murmur.  Pulmonary: CTAB examined in supine position.  Abdomen: Soft, non-distended. +bowel sounds.  Neuro: Lethargic and minimally interactive. Occasionally answered questions with mumbling.  Skin: Warm, dry. No cyanosis.  Assessment/Plan:  Acute Ischemic Stroke: Right lentiform and surrounding structure involved without hemorrhagic conversion. Neurological deficits have failed to improve, including facial droop and dysphagia. Unfortunately the patient had poor functional status PTA and was bedbound requiring full assistance. I was present during family meeting with palliative care yesterday afternoon. A good discussion was had regarding [name] life prior to recent stroke, his current deficits, lack of improvement and suspicion for lack of meaningful improvement. Wife brought in living will papers which were reviewed. These indicate he does not want to be attached to a machine for life-prolonging measures, including prolonged attachment to feeding tube. Good discussion was had regarding [name] wishes and possible options at this point, including G-tube/PEG tube placement. Consensus was reached that this would not be in  [name] best interest as this intervention would likely not lead to meaningful recovery. Disposition options were discussed and hospice care was determined to be the most appropriate option for this patient and his family. His wife desires for him to be in a facility closer to her as she does not drive and wishes to spend quality time with him. I have consulted social work to assist us with placement.  -Social work has been consulted for SNF placement. Family strongly desires placement in [place] as wife does not drive -Will need to discuss benefit vs risk of dual antiplatelet therapy.   History of DVT: Lovenox for routine VTE prophylaxis.   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 FTA-ab reactive. This test becomes reactive approx 4-6 weeks after infection and remains reactive for many years following resolution of infection, even with treatment.   DM2: Discontinued Lantus 13 units and D5 1/2 ns @ 75 mL/hr. Have continued SSI  HTN: Holding home Amlodipine 10 mg and Toprol xl 100mg   Dispo: Hospice. Awaiting placement.   Venesa Semidey, D.O. Internal Medicine Resident - PGY1 Pager: [phone]

## 2016-12-31 NOTE — Progress Notes (Signed)
SLP Cancellation Note  Patient Details Name: [name] MRN: [id number] DOB: 1946-04-21   Cancelled treatment:       Reason Eval/Treat Not Completed: Other (comment) Discussed with RN and Palliative Care. Pt transitioning to comfort care and family is pursuing residential hospice. They have already elected for comfort feeds, no further SLP needs at this time. Will sign off - please reorder if we can be of assistance.   Maxcine Hamaiewonsky, Tujuana Kilmartin 12/31/2016, 10:08 AM  Maxcine HamLaura Paiewonsky, M.A. CCC-SLP [phone]

## 2016-12-31 NOTE — Progress Notes (Signed)
[facility] arrived to transport patient to Hospice. Report given to accepting nurse at Hospice([phone])

## 2017-01-26 DEATH — deceased

## 2017-02-04 ENCOUNTER — Telehealth: Payer: Self-pay | Admitting: General Practice

## 2017-02-04 NOTE — Telephone Encounter (Signed)
I called on 01/14/17 to follow up with the patient since leaving nursing home. Mrs. answered and stated that the patient passed away on 2017/01/05. V. McCain (PSC)

## 2017-11-11 IMAGING — US US ART/VEN ABD/PELV/SCROTUM DOPPLER LTD
1 series · 14 of 25 positions shown · non-contrast
Comparison: None.

CLINICAL DATA: Right testicular pain for 2 weeks.

EXAM:
SCROTAL ULTRASOUND
DOPPLER ULTRASOUND OF THE TESTICLES
TECHNIQUE: Complete ultrasound examination of the testicles, epididymis, and
other scrotal structures was performed. Color and spectral Doppler
ultrasound were also utilized to evaluate blood flow to the
testicles.

[Series 1: us art/ven abd/pelv/scrotum doppler ltd · 0.08mm/px · 14 of 90 slices shown]
[im 1/90]
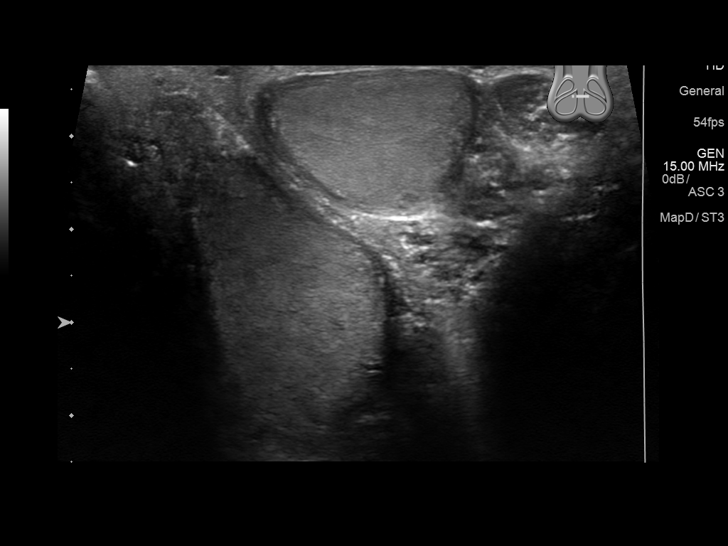
[im 8/90]
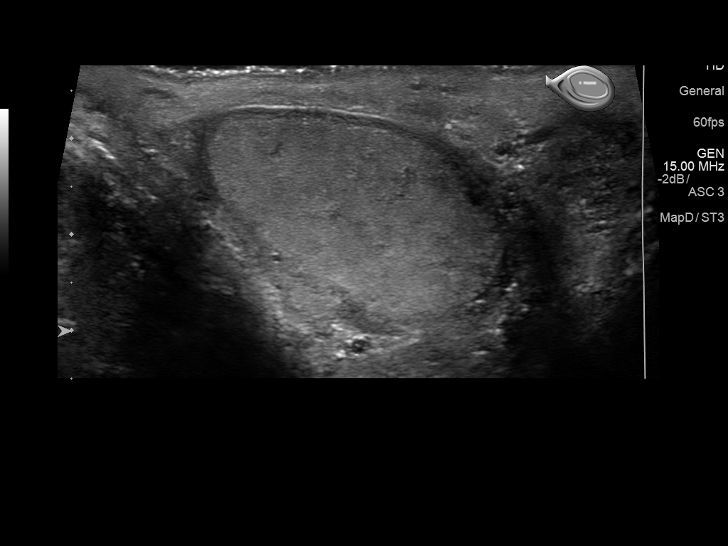
[im 15/90]
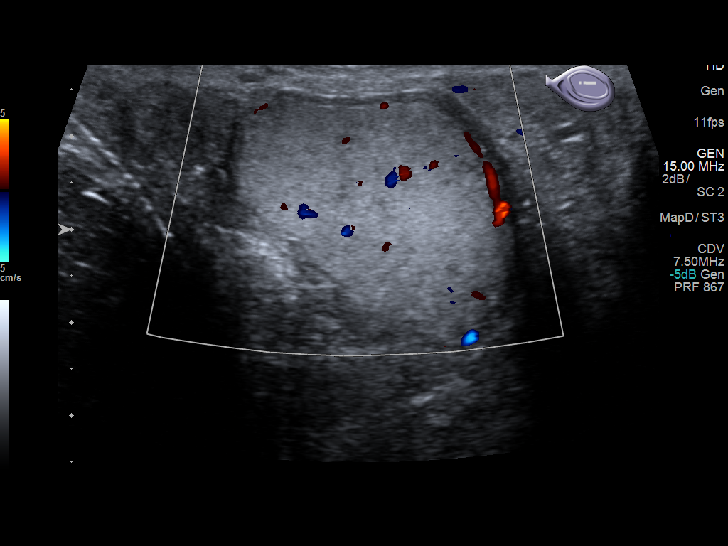
[im 23/90]
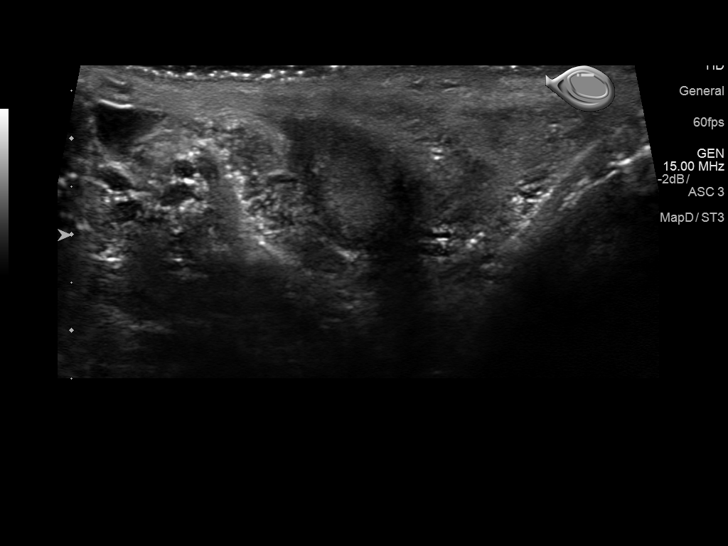
[im 30/90]
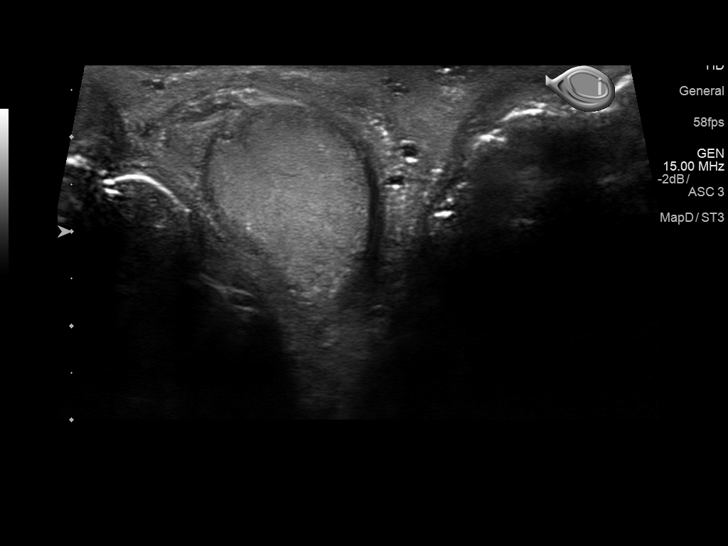
[im 34/90]
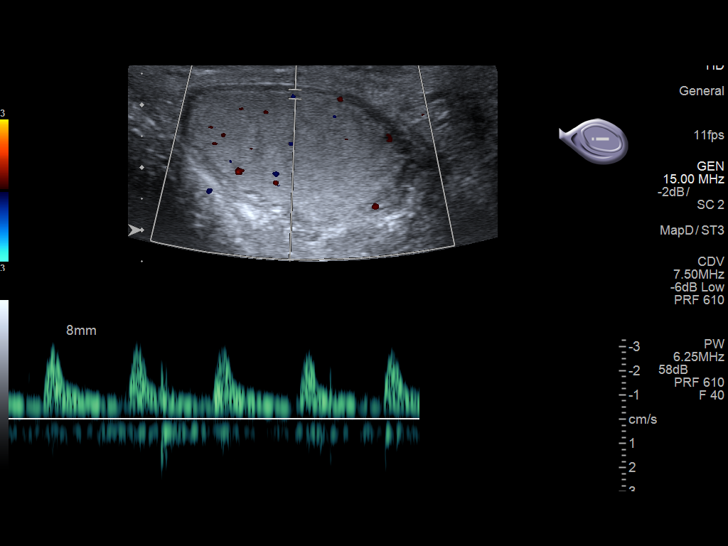
[im 41/90]
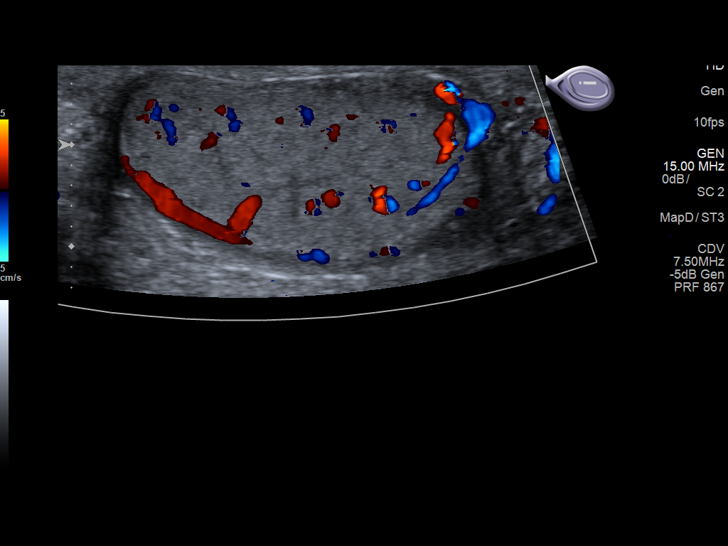
[im 49/90]
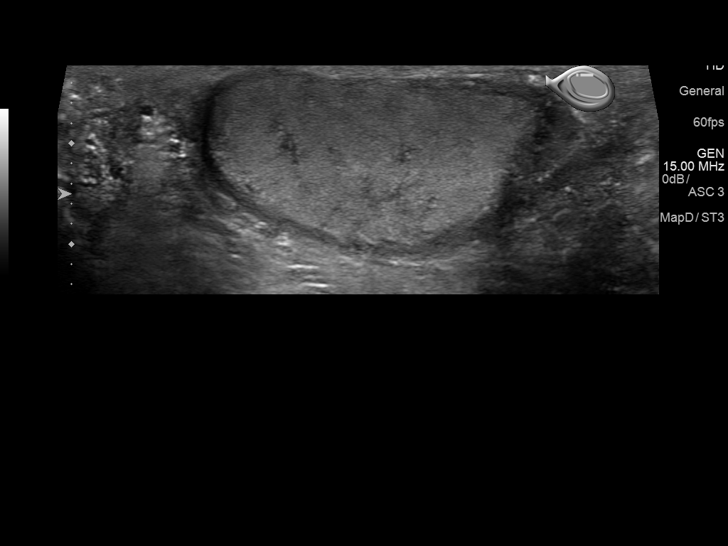
[im 56/90]
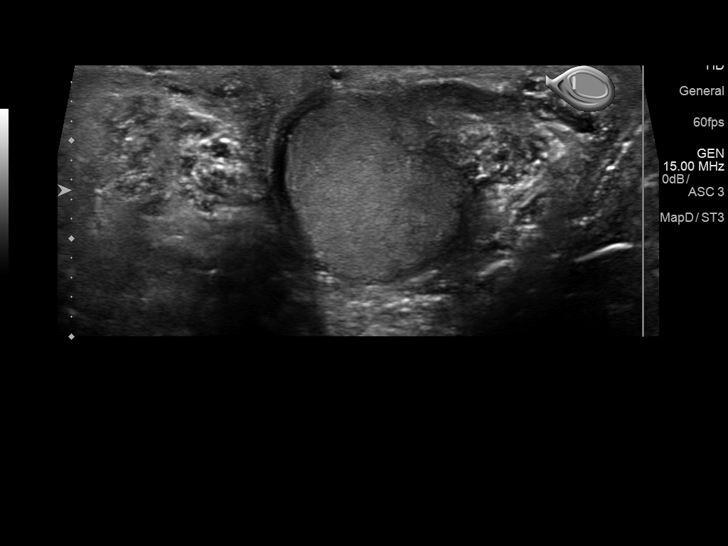
[im 60/90]
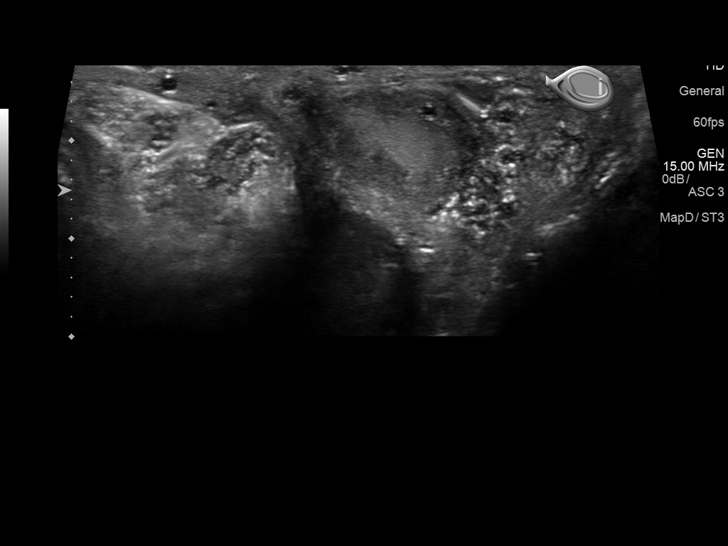
[im 67/90]
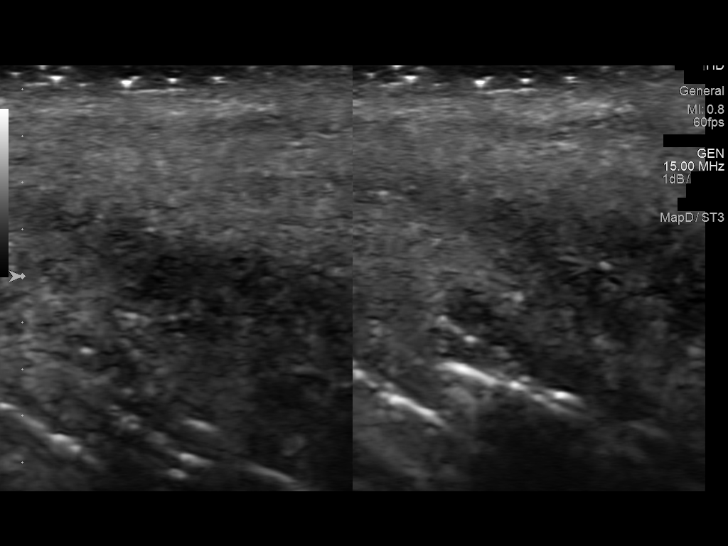
[im 75/90]
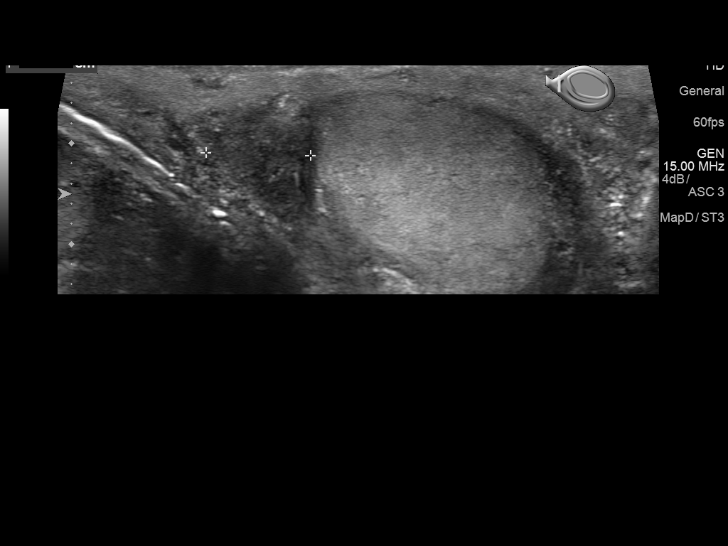
[im 82/90]
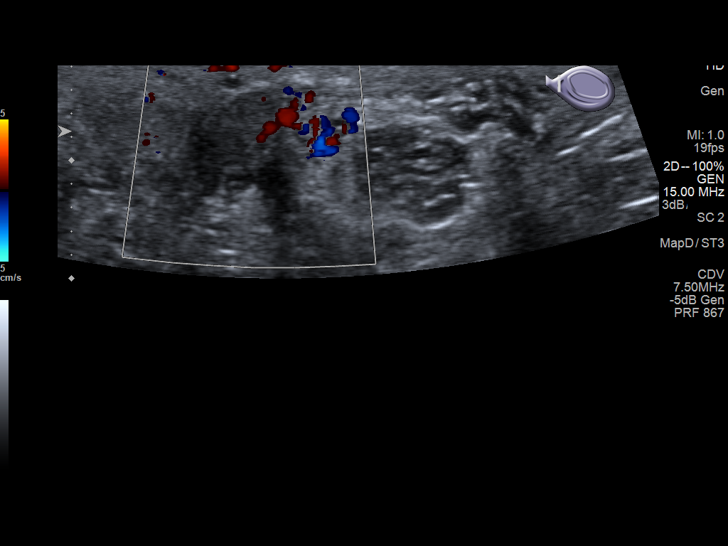
[im 90/90]
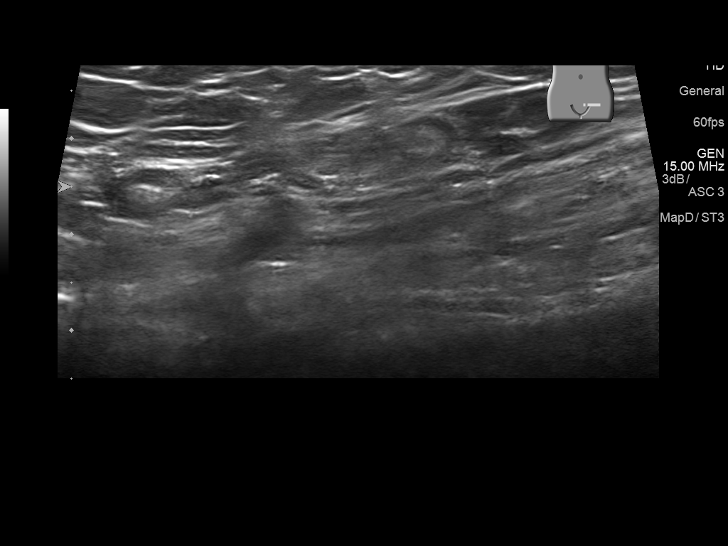

[14 of 25 positions shown; findings below may reference images not displayed]

FINDINGS: Right testicle

Measurements: 3.4 x 1.9 x 2.0 cm. No mass or microlithiasis
visualized.

Left testicle

Measurements: 3.7 x 1.7 x 2.2 cm. No mass or microlithiasis
visualized.

Right epididymis:  Normal in size and appearance.

Left epididymis:  Normal in size and appearance.

Hydrocele:  None visualized.

Varicocele:  None visualized.

Pulsed Doppler interrogation of both testes demonstrates normal low
resistance arterial and venous waveforms bilaterally.
IMPRESSION: Negative. No evidence testicular mass, testicular torsion, or other
sonographic abnormality.

## 2018-02-15 IMAGING — CT CT RENAL STONE PROTOCOL
2 of 4 series · 15 of 46 positions shown, 17 images · non-contrast
Comparison: Scrotal ultrasound obtained earlier today.

CLINICAL DATA: Urinary tract infection.  Right scrotal pain.

EXAM:
CT ABDOMEN AND PELVIS WITHOUT CONTRAST
TECHNIQUE: Multidetector CT imaging of the abdomen and pelvis was performed
following the standard protocol without IV contrast.

[Series 2: axial st · axial · 0.87mm/px · z∈[-1172,-656]mm · 12 of 113 slices shown, 14 images]
[im 5/113  soft-tissue]
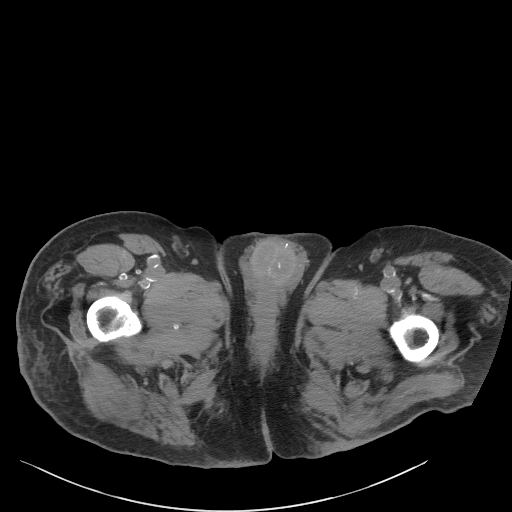
[im 5/113  bone]
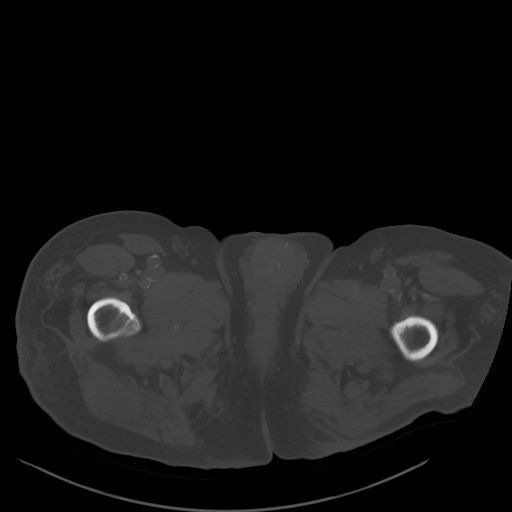
[im 15/113  soft-tissue]
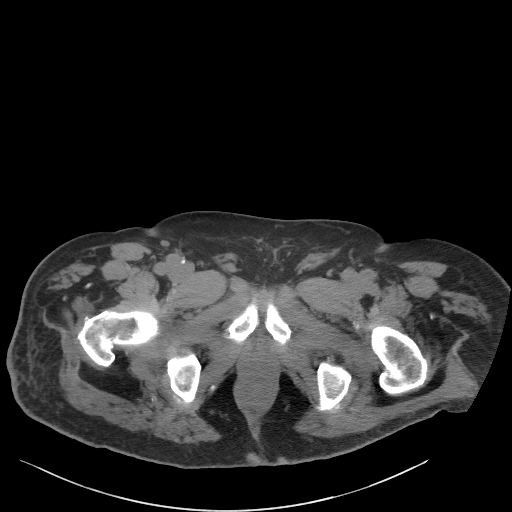
[im 25/113  soft-tissue]
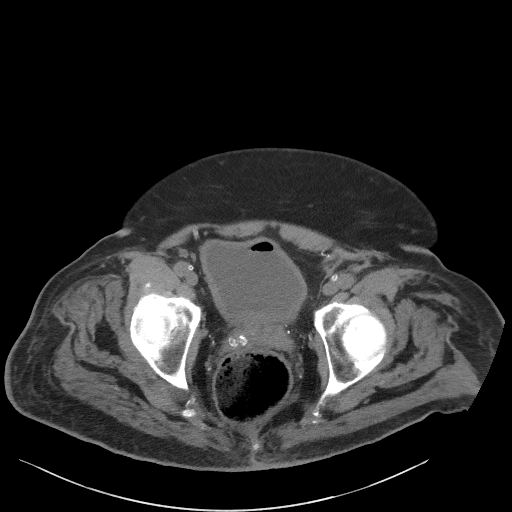
[im 35/113  soft-tissue]
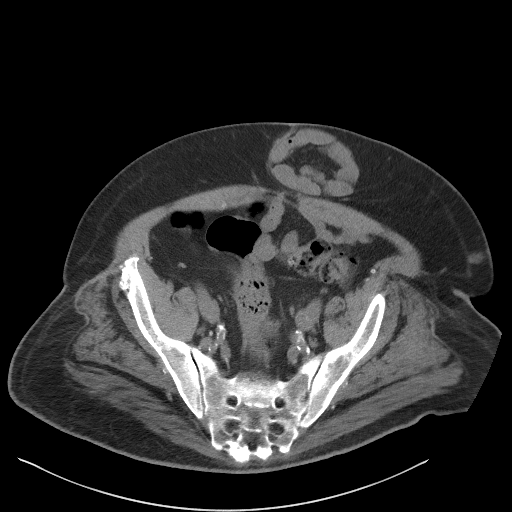
[im 44/113  soft-tissue]
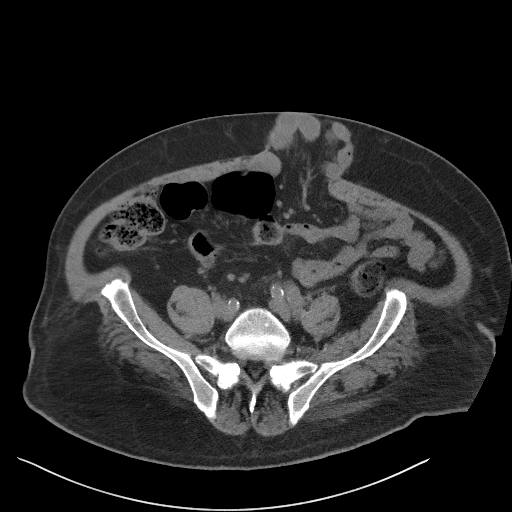
[im 54/113  soft-tissue]
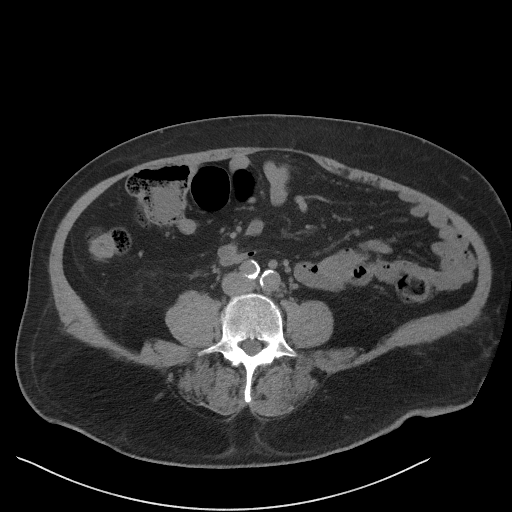
[im 59/113  soft-tissue]
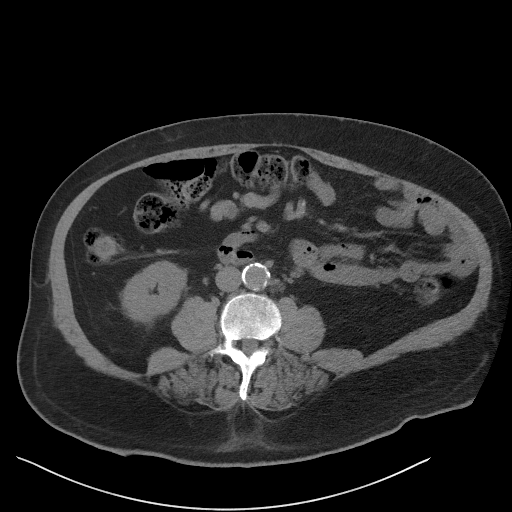
[im 69/113  soft-tissue]
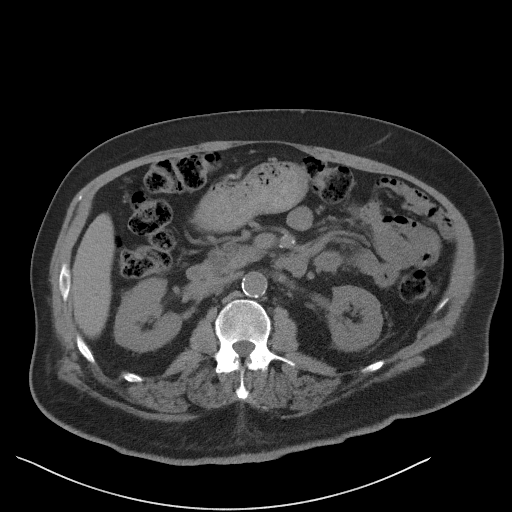
[im 78/113  soft-tissue]
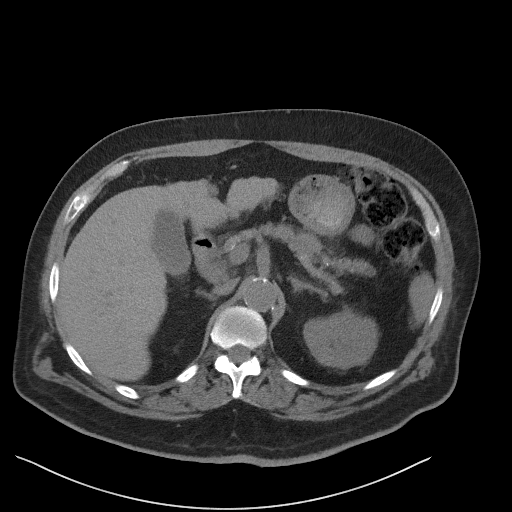
[im 78/113  bone]
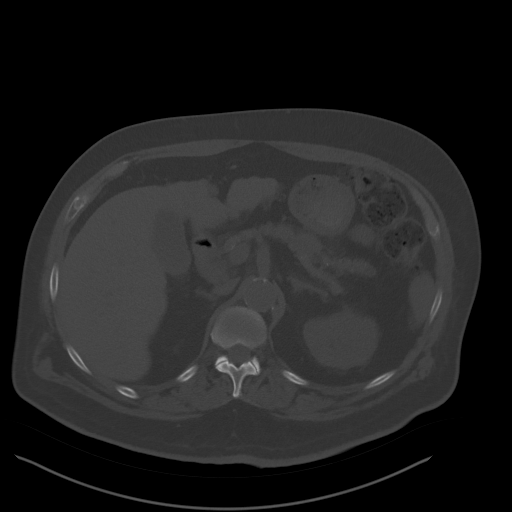
[im 88/113  soft-tissue]
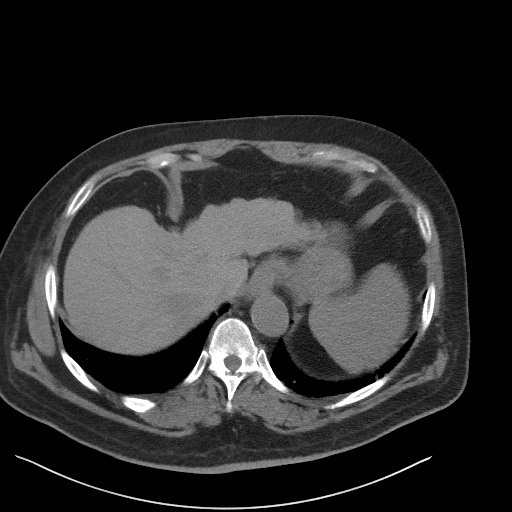
[im 98/113  soft-tissue]
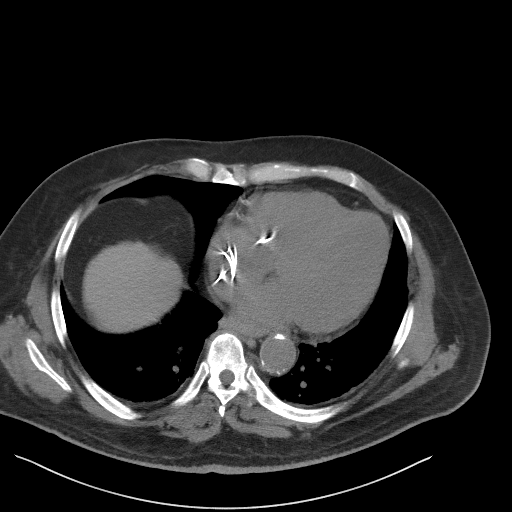
[im 108/113  soft-tissue]
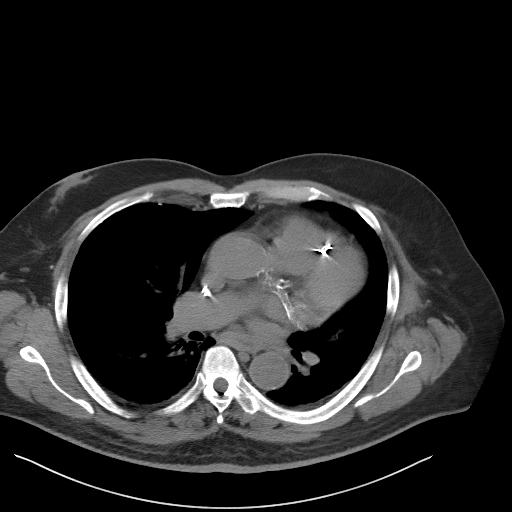

[Series 5: coronal st · coronal · 0.88mm/px · 3 of 114 slices shown]
[im 38/114  soft-tissue]
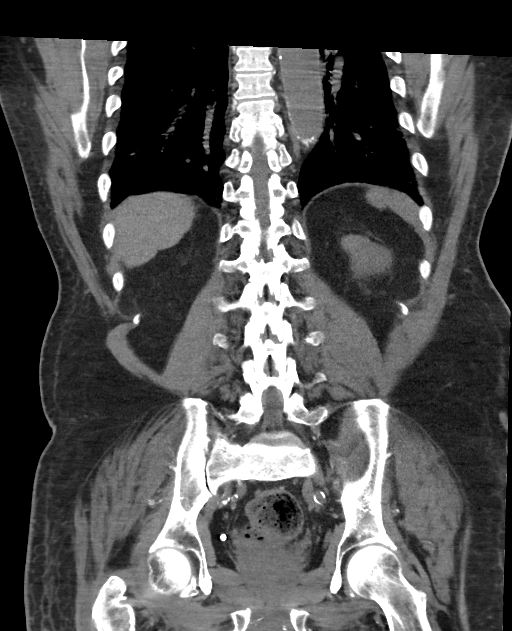
[im 51/114  soft-tissue]
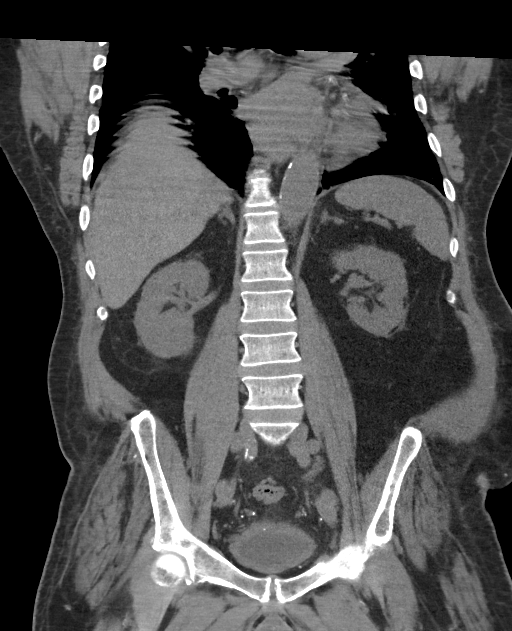
[im 63/114  soft-tissue]
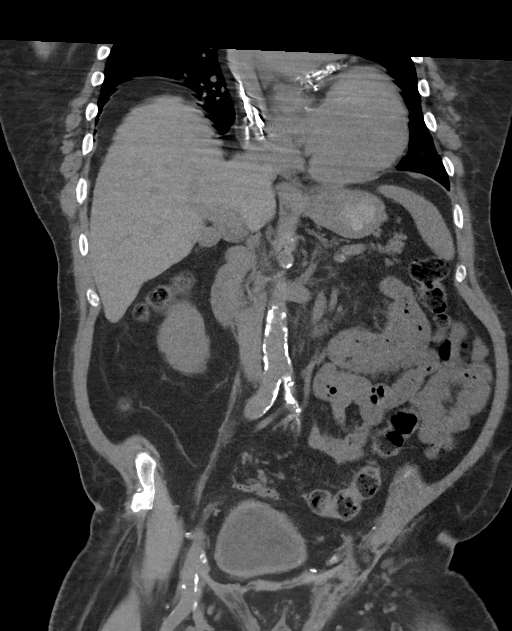

[15 of 46 positions shown; findings below may reference images not displayed]

FINDINGS: Lower chest: Mild bilateral gynecomastia. Extensive coronary artery
calcifications. Cardiac pacemaker leads. Small amount of linear
atelectasis or scarring at the left lung base.

Hepatobiliary: Nodular liver contours with a prominent caudate lobe.
Multiple gallstones in the gallbladder. The individual stones are
small, measuring approximately 3 mm in maximum diameter. No
gallbladder wall thickening or pericholecystic fluid.

Pancreas: Diffusely small.

Spleen: Within normal limits in size and appearance.

Adrenals/Urinary Tract: Small lower pole right renal cyst. No
urinary tract calculi or hydronephrosis. Air in the urinary bladder,
compatible with recent catheterization. Mild diffuse bladder wall
thickening. Normal appearing adrenal glands.

Stomach/Bowel: Prominent stool throughout the colon. Minimal sigmoid
colon diverticulosis. No gastric or small bowel abnormalities.
Normal appearing appendix.

Vascular/Lymphatic: Atheromatous arterial calcifications, including
the abdominal aorta and its branches. No enlarged lymph nodes.

Reproductive: Mildly enlarged prostate gland.

Other: Moderately large ventral hernia to the left of midline at the
level of the upper pelvis, containing multiple herniated small bowel
loops, without bowel dilatation or wall thickening. No associated
edema. There is a smaller ventral herniated to the left of midline
slightly more superiorly, containing herniated fat.

Musculoskeletal: Mild lumbar and lower thoracic spine degenerative
changes.
IMPRESSION: 1. No acute abnormality.
2. Extensive atheromatous coronary artery calcifications.
3. Aortic atherosclerosis.
4. Moderate diffuse pancreatic atrophy.
5. Mild diffuse bladder wall thickening. This could be due to
bladder outlet obstruction by the mildly enlarged prostate gland or
due to cystitis.
6. Minimal sigmoid colon diverticulosis.
7. Moderately large ventral hernia to the left of midline at the
level of the upper pelvis, containing multiple herniated small bowel
loops without obstruction.
8. Smaller ventral hernia containing herniated fat.
9. Prominent stool throughout the colon.
